# Patient Record
Sex: Female | Born: 1970 | Race: White | Hispanic: No | Marital: Married | Smoking: Former smoker
Health system: Southern US, Community
[De-identification: ages and names within clinical notes are randomized; demographics above are authoritative.]

## PROBLEM LIST (undated history)

## (undated) DIAGNOSIS — M419 Scoliosis, unspecified: Secondary | ICD-10-CM

## (undated) DIAGNOSIS — D649 Anemia, unspecified: Secondary | ICD-10-CM

## (undated) DIAGNOSIS — M1990 Unspecified osteoarthritis, unspecified site: Secondary | ICD-10-CM

## (undated) DIAGNOSIS — M5136 Other intervertebral disc degeneration, lumbar region: Secondary | ICD-10-CM

## (undated) DIAGNOSIS — F32A Depression, unspecified: Secondary | ICD-10-CM

## (undated) DIAGNOSIS — K047 Periapical abscess without sinus: Secondary | ICD-10-CM

## (undated) DIAGNOSIS — G43909 Migraine, unspecified, not intractable, without status migrainosus: Secondary | ICD-10-CM

## (undated) DIAGNOSIS — G5640 Causalgia of unspecified upper limb: Secondary | ICD-10-CM

## (undated) DIAGNOSIS — F419 Anxiety disorder, unspecified: Secondary | ICD-10-CM

## (undated) DIAGNOSIS — F444 Conversion disorder with motor symptom or deficit: Secondary | ICD-10-CM

## (undated) DIAGNOSIS — T783XXA Angioneurotic edema, initial encounter: Secondary | ICD-10-CM

## (undated) DIAGNOSIS — L509 Urticaria, unspecified: Secondary | ICD-10-CM

## (undated) DIAGNOSIS — M5126 Other intervertebral disc displacement, lumbar region: Secondary | ICD-10-CM

## (undated) DIAGNOSIS — R569 Unspecified convulsions: Secondary | ICD-10-CM

## (undated) DIAGNOSIS — M51369 Other intervertebral disc degeneration, lumbar region without mention of lumbar back pain or lower extremity pain: Secondary | ICD-10-CM

## (undated) DIAGNOSIS — G9050 Complex regional pain syndrome I, unspecified: Secondary | ICD-10-CM

## (undated) HISTORY — DX: Scoliosis, unspecified: M41.9

## (undated) HISTORY — DX: Conversion disorder with motor symptom or deficit: F44.4

## (undated) HISTORY — DX: Unspecified convulsions: R56.9

## (undated) HISTORY — PX: SINOSCOPY: SHX187

## (undated) HISTORY — PX: SHOULDER SURGERY: SHX246

## (undated) HISTORY — PX: CHOLECYSTECTOMY: SHX55

## (undated) HISTORY — PX: ABDOMINAL HYSTERECTOMY: SHX81

## (undated) HISTORY — DX: Urticaria, unspecified: L50.9

## (undated) HISTORY — DX: Causalgia of unspecified upper limb: G56.40

## (undated) HISTORY — DX: Unspecified osteoarthritis, unspecified site: M19.90

## (undated) HISTORY — PX: VAGINAL HYSTERECTOMY: SHX2639

## (undated) HISTORY — DX: Anemia, unspecified: D64.9

## (undated) HISTORY — DX: Angioneurotic edema, initial encounter: T78.3XXA

## (undated) HISTORY — DX: Periapical abscess without sinus: K04.7

## (undated) HISTORY — PX: OTHER SURGICAL HISTORY: SHX169

## (undated) HISTORY — PX: TUBAL LIGATION: SHX77

## (undated) HISTORY — DX: Depression, unspecified: F32.A

## (undated) HISTORY — PX: WRIST SURGERY: SHX841

## (undated) HISTORY — PX: COSMETIC SURGERY: SHX468

---

## 1998-01-08 ENCOUNTER — Other Ambulatory Visit: Admission: RE | Admit: 1998-01-08 | Discharge: 1998-01-08 | Payer: Self-pay | Admitting: Obstetrics and Gynecology

## 1999-01-14 ENCOUNTER — Other Ambulatory Visit: Admission: RE | Admit: 1999-01-14 | Discharge: 1999-01-14 | Payer: Self-pay | Admitting: Obstetrics and Gynecology

## 1999-05-01 ENCOUNTER — Ambulatory Visit (HOSPITAL_BASED_OUTPATIENT_CLINIC_OR_DEPARTMENT_OTHER): Admission: RE | Admit: 1999-05-01 | Discharge: 1999-05-01 | Payer: Self-pay | Admitting: Orthopedic Surgery

## 2001-06-16 ENCOUNTER — Other Ambulatory Visit: Admission: RE | Admit: 2001-06-16 | Discharge: 2001-06-16 | Payer: Self-pay | Admitting: Obstetrics and Gynecology

## 2001-12-29 ENCOUNTER — Ambulatory Visit (HOSPITAL_COMMUNITY): Admission: RE | Admit: 2001-12-29 | Discharge: 2001-12-29 | Payer: Self-pay | Admitting: Internal Medicine

## 2002-01-13 ENCOUNTER — Ambulatory Visit (HOSPITAL_COMMUNITY): Admission: RE | Admit: 2002-01-13 | Discharge: 2002-01-13 | Payer: Self-pay | Admitting: Internal Medicine

## 2002-01-23 ENCOUNTER — Ambulatory Visit (HOSPITAL_COMMUNITY): Admission: RE | Admit: 2002-01-23 | Discharge: 2002-01-23 | Payer: Self-pay | Admitting: Internal Medicine

## 2002-01-23 ENCOUNTER — Encounter: Payer: Self-pay | Admitting: Internal Medicine

## 2002-02-14 ENCOUNTER — Encounter: Payer: Self-pay | Admitting: Internal Medicine

## 2002-02-14 ENCOUNTER — Ambulatory Visit (HOSPITAL_COMMUNITY): Admission: RE | Admit: 2002-02-14 | Discharge: 2002-02-14 | Payer: Self-pay | Admitting: Internal Medicine

## 2004-03-04 ENCOUNTER — Other Ambulatory Visit: Admission: RE | Admit: 2004-03-04 | Discharge: 2004-03-04 | Payer: Self-pay | Admitting: Obstetrics and Gynecology

## 2004-04-24 ENCOUNTER — Ambulatory Visit (HOSPITAL_COMMUNITY): Admission: RE | Admit: 2004-04-24 | Discharge: 2004-04-24 | Payer: Self-pay | Admitting: Obstetrics and Gynecology

## 2004-04-24 ENCOUNTER — Encounter (INDEPENDENT_AMBULATORY_CARE_PROVIDER_SITE_OTHER): Payer: Self-pay | Admitting: *Deleted

## 2004-04-24 ENCOUNTER — Observation Stay (HOSPITAL_COMMUNITY): Admission: AD | Admit: 2004-04-24 | Discharge: 2004-04-25 | Payer: Self-pay | Admitting: Obstetrics & Gynecology

## 2005-02-18 ENCOUNTER — Other Ambulatory Visit: Admission: RE | Admit: 2005-02-18 | Discharge: 2005-02-18 | Payer: Self-pay | Admitting: Obstetrics and Gynecology

## 2007-04-04 ENCOUNTER — Encounter: Admission: RE | Admit: 2007-04-04 | Discharge: 2007-04-04 | Payer: Self-pay | Admitting: Obstetrics and Gynecology

## 2008-03-01 ENCOUNTER — Emergency Department: Payer: Self-pay | Admitting: Internal Medicine

## 2008-03-01 ENCOUNTER — Other Ambulatory Visit: Payer: Self-pay

## 2009-06-24 ENCOUNTER — Emergency Department (HOSPITAL_COMMUNITY): Admission: EM | Admit: 2009-06-24 | Discharge: 2009-06-25 | Payer: Self-pay | Admitting: Emergency Medicine

## 2011-01-23 NOTE — Op Note (Signed)
NAME:  Michele Estrada, Michele Estrada                           ACCOUNT NO.:  [id number]   MEDICAL RECORD NO.:  [id number]                   PATIENT TYPE:  OBV   LOCATION:  9307                                 FACILITY:  [facility]   PHYSICIAN:  Malva Limes, M.D.                 DATE OF BIRTH:  [birth year]   DATE OF PROCEDURE:  04/24/2004  DATE OF DISCHARGE:                                 OPERATIVE REPORT   PREOPERATIVE DIAGNOSES:  1. Menorrhagia.  2. Severe dysmenorrhea.   POSTOPERATIVE DIAGNOSES:  1. Menorrhagia.  2. Severe dysmenorrhea.   PROCEDURES:  1. Dilation and curettage.  2. Novasure endometrial ablation.   SURGEON:  Malva Limes, M.D.   ANESTHESIA:  General.   ANTIBIOTICS:  Ancef 1 g.   ESTIMATED BLOOD LOSS:  Minimal.   SPECIMENS:  Endometrial curettings sent to pathology.   COMPLICATIONS:  None.   DRAINS:  Red rubber catheter to the bladder.   PROCEDURE:  The patient was taken to the operating room, where she was  placed in the dorsal supine position.  A general anesthetic was administered  without complications.  She was then placed in the dorsal lithotomy  position.  She was prepped with Hibiclens and her bladder was drained with a  red rubber catheter.  The patient was prepped in the usual fashion for this  procedure.  A sterile speculum was placed in the vagina, a single-tooth  tenaculum was applied to the anterior cervical lip.  The cervix was dilated  to a 2 [place].  The uterine sound measured the endometrial cavity at 8 cm.  Following this the cervical length was measured and found to be 3 cm.  At  this point sharp curettage was performed with tissue being obtained and sent  to pathology.  The Novasure device was then placed into the uterine cavity  into the fundus.  The device was then seated and the endometrial width found  to be 3.8 cm.  Following this the device was turned on for a total of 59  seconds at 105 watts.  The patient tolerated the  procedure well, and  she was taken to the recovery room in stable condition.  Instrument and lap counts were correct x1.  The patient will be discharged  to home.  She will be sent home with Aleve to take p.r.n.  She will be  instructed to follow up in the office in four weeks.                                               Malva Limes, M.D.    MA/MEDQ  D:  04/24/2004  T:  04/25/2004  Job:  [id number]

## 2012-05-13 ENCOUNTER — Encounter: Payer: Self-pay | Admitting: Family Medicine

## 2012-05-13 ENCOUNTER — Ambulatory Visit (INDEPENDENT_AMBULATORY_CARE_PROVIDER_SITE_OTHER): Payer: Self-pay | Admitting: Family Medicine

## 2012-05-13 VITALS — BP 118/78 | HR 86 | Temp 98.3°F | Ht 61.0 in | Wt 147.0 lb

## 2012-05-13 DIAGNOSIS — Z86718 Personal history of other venous thrombosis and embolism: Secondary | ICD-10-CM | POA: Insufficient documentation

## 2012-05-13 DIAGNOSIS — Z862 Personal history of diseases of the blood and blood-forming organs and certain disorders involving the immune mechanism: Secondary | ICD-10-CM

## 2012-05-13 DIAGNOSIS — IMO0002 Reserved for concepts with insufficient information to code with codable children: Secondary | ICD-10-CM | POA: Insufficient documentation

## 2012-05-13 DIAGNOSIS — Z9149 Other personal history of psychological trauma, not elsewhere classified: Secondary | ICD-10-CM

## 2012-05-13 DIAGNOSIS — G43109 Migraine with aura, not intractable, without status migrainosus: Secondary | ICD-10-CM | POA: Insufficient documentation

## 2012-05-13 NOTE — Progress Notes (Signed)
  Subjective:    Patient ID: Michele Estrada, female    DOB: Jun 26, 1971, 41 y.o.   MRN: [id number]  HPI Here to establish care.  Sees neuro (Love) for migraine headaches.  Applying for [facility] card. Behind on health maint.  No general doc visit x 3 years. Feels well, no complaints.    Review of Systems     Objective:   Physical Exam Lungs clear Cardiac RRR without m or g       Assessment & Plan:

## 2012-05-13 NOTE — Assessment & Plan Note (Signed)
Needs CBC next visit plus lots of preventive work.

## 2012-05-13 NOTE — Assessment & Plan Note (Signed)
Stable, Dr. Sandria Manly will manage moving forward

## 2012-05-13 NOTE — Patient Instructions (Addendum)
[employer] foods that are high in iron.  Dark green leafy vegetables. When you come back,  You will get a Pap smear, lots of blood work, a booster tetanus shot,  And a flu shot. Best to come back after [employer]

## 2012-06-03 ENCOUNTER — Encounter: Payer: Self-pay | Admitting: Family Medicine

## 2012-06-03 ENCOUNTER — Other Ambulatory Visit (HOSPITAL_COMMUNITY)
Admission: RE | Admit: 2012-06-03 | Discharge: 2012-06-03 | Disposition: A | Discharge status: Home / Self Care | Payer: Self-pay | Source: Ambulatory Visit | Attending: Family Medicine | Admitting: Family Medicine

## 2012-06-03 ENCOUNTER — Ambulatory Visit (INDEPENDENT_AMBULATORY_CARE_PROVIDER_SITE_OTHER): Payer: Self-pay | Admitting: Family Medicine

## 2012-06-03 VITALS — BP 118/78 | HR 80 | Ht 61.0 in | Wt 146.4 lb

## 2012-06-03 DIAGNOSIS — Z13220 Encounter for screening for lipoid disorders: Secondary | ICD-10-CM | POA: Insufficient documentation

## 2012-06-03 DIAGNOSIS — Z01419 Encounter for gynecological examination (general) (routine) without abnormal findings: Secondary | ICD-10-CM | POA: Insufficient documentation

## 2012-06-03 DIAGNOSIS — Z0000 Encounter for general adult medical examination without abnormal findings: Secondary | ICD-10-CM | POA: Insufficient documentation

## 2012-06-03 DIAGNOSIS — Z124 Encounter for screening for malignant neoplasm of cervix: Secondary | ICD-10-CM

## 2012-06-03 DIAGNOSIS — N6310 Unspecified lump in the right breast, unspecified quadrant: Secondary | ICD-10-CM | POA: Insufficient documentation

## 2012-06-03 DIAGNOSIS — Z862 Personal history of diseases of the blood and blood-forming organs and certain disorders involving the immune mechanism: Secondary | ICD-10-CM

## 2012-06-03 DIAGNOSIS — Z23 Encounter for immunization: Secondary | ICD-10-CM

## 2012-06-03 DIAGNOSIS — N63 Unspecified lump in unspecified breast: Secondary | ICD-10-CM

## 2012-06-03 DIAGNOSIS — Z833 Family history of diabetes mellitus: Secondary | ICD-10-CM | POA: Insufficient documentation

## 2012-06-03 LAB — CBC
HCT: 39.6 % (ref 36.0–46.0)
Hemoglobin: 13.5 g/dL (ref 12.0–15.0)
MCV: 89.6 fL (ref 78.0–100.0)
RDW: 12.2 % (ref 11.5–15.5)
WBC: 6.2 10*3/uL (ref 4.0–10.5)

## 2012-06-03 LAB — BASIC METABOLIC PANEL
Calcium: 9.4 mg/dL (ref 8.4–10.5)
Creat: 0.96 mg/dL (ref 0.50–1.10)
Sodium: 141 mEq/L (ref 135–145)

## 2012-06-03 LAB — LIPID PANEL
HDL: 40 mg/dL (ref >39)
LDL Cholesterol: 83 mg/dL (ref 0–99)
Total CHOL/HDL Ratio: 3.4 Ratio
Triglycerides: 67 mg/dL (ref <150)
VLDL: 13 mg/dL (ref 0–40)

## 2012-06-03 MED ORDER — KETOPROFEN 50 MG PO CAPS: 50.0000 mg | ORAL_CAPSULE | Freq: Three times a day (TID) | ORAL | Status: DC | PRN

## 2012-06-03 NOTE — Progress Notes (Signed)
  Subjective:    Patient ID: Michele Estrada, female    DOB: 04-Oct-1970, 41 y.o.   MRN: [id number]  HPI In for physical.  Also complains of Rt breast mass x 4 weeks.  Did have trauma to the area but feels that had breast mass before trauma.  Behind on health maint.  Otherwise feels fine.   + FHx for hypertension and diabetes.       Review of Systems     Objective:   Physical Exam HEENT normal Neck supple  Lungs clear.   Cardiac RRR without m or G Rt breast small mass and marked tenderness in lateral aspect at about 10 o,cloock Abd benign Pelvic WNL Pap done.       Assessment & Plan:

## 2012-06-03 NOTE — Assessment & Plan Note (Signed)
Diagnostic mammo 

## 2012-06-03 NOTE — Assessment & Plan Note (Signed)
Check cbc 

## 2012-06-03 NOTE — Patient Instructions (Addendum)
Flu shot today - every year Tdap = tetanus and pertussis today: once every 10 years. I will call with labs and pap Make sure you get your mammogram

## 2012-06-03 NOTE — Assessment & Plan Note (Signed)
Check chol

## 2012-06-06 ENCOUNTER — Ambulatory Visit
Admission: RE | Admit: 2012-06-06 | Discharge: 2012-06-06 | Disposition: A | Discharge status: Home / Self Care | Payer: Self-pay | Source: Ambulatory Visit | Attending: Family Medicine | Admitting: Family Medicine

## 2012-06-06 ENCOUNTER — Encounter: Payer: Self-pay | Admitting: Family Medicine

## 2012-06-06 ENCOUNTER — Other Ambulatory Visit: Payer: Self-pay | Admitting: Family Medicine

## 2012-06-06 DIAGNOSIS — N6310 Unspecified lump in the right breast, unspecified quadrant: Secondary | ICD-10-CM

## 2012-06-15 ENCOUNTER — Encounter: Payer: Self-pay | Admitting: Family Medicine

## 2012-06-15 ENCOUNTER — Ambulatory Visit (INDEPENDENT_AMBULATORY_CARE_PROVIDER_SITE_OTHER): Payer: Self-pay | Admitting: Family Medicine

## 2012-06-15 VITALS — BP 117/96 | HR 100 | Temp 98.8°F | Wt 142.0 lb

## 2012-06-15 DIAGNOSIS — L259 Unspecified contact dermatitis, unspecified cause: Secondary | ICD-10-CM | POA: Insufficient documentation

## 2012-06-15 MED ORDER — PREDNISONE 20 MG PO TABS: ORAL_TABLET | ORAL | Status: DC

## 2012-06-15 NOTE — Patient Instructions (Addendum)
You have poisen ivy.  Sign up for My Chart.

## 2012-06-15 NOTE — Assessment & Plan Note (Addendum)
Poison ivy of leg.  Will Rx with prednisone not due to severity of rash but due to the severity of her previous reactions.

## 2012-06-16 NOTE — Progress Notes (Signed)
  Subjective:    Patient ID: Michele Estrada, female    DOB: 08/17/1971, 41 y.o.   MRN: [id number]  HPI Rash on legs, worked in yard two days ago, concerned about poison ivy.  Has had severe reactions in the past.    Review of Systems     Objective:   Physical Exam Mild rash on right leg.  Linear consistent with early poison ivy       Assessment & Plan:

## 2012-06-20 ENCOUNTER — Telehealth: Payer: Self-pay | Admitting: Family Medicine

## 2012-06-20 DIAGNOSIS — K0381 Cracked tooth: Secondary | ICD-10-CM

## 2012-06-20 NOTE — Telephone Encounter (Signed)
Patient is calling for a referral because she broke a tooth completely in half and needs to see a dentist.

## 2012-06-20 NOTE — Telephone Encounter (Signed)
Called and LMOVM informing pt that she will have to find a dentist on her own due to they only take 10 referrals per month and those slots have been filled, or if it is possible we can try to get her on the referral list for November this is not a guarantee that she will be seen due to limited spaces.  She can call around to find a dentist however, she will have to pay out of pocket. Laureen Ochs, Viann Shove

## 2012-06-20 NOTE — Addendum Note (Signed)
Addended by: Deno Etienne on: 06/20/2012 03:20 PM   Modules accepted: Orders

## 2012-07-04 ENCOUNTER — Ambulatory Visit (INDEPENDENT_AMBULATORY_CARE_PROVIDER_SITE_OTHER): Payer: Self-pay | Admitting: Family Medicine

## 2012-07-04 ENCOUNTER — Encounter: Payer: Self-pay | Admitting: Family Medicine

## 2012-07-04 VITALS — BP 152/132 | HR 73 | Temp 98.5°F | Ht 61.0 in | Wt 145.0 lb

## 2012-07-04 DIAGNOSIS — K047 Periapical abscess without sinus: Secondary | ICD-10-CM

## 2012-07-04 HISTORY — DX: Periapical abscess without sinus: K04.7

## 2012-07-04 MED ORDER — CLINDAMYCIN HCL 300 MG PO CAPS: 300.0000 mg | ORAL_CAPSULE | Freq: Three times a day (TID) | ORAL | Status: DC

## 2012-07-04 MED ORDER — OXYCODONE-ACETAMINOPHEN 5-325 MG PO TABS: 1.0000 | ORAL_TABLET | Freq: Three times a day (TID) | ORAL | Status: DC | PRN

## 2012-07-04 MED ORDER — AMOXICILLIN 500 MG PO CAPS: 500.0000 mg | ORAL_CAPSULE | Freq: Three times a day (TID) | ORAL | Status: DC

## 2012-07-04 MED ORDER — AMOXICILLIN-POT CLAVULANATE 875-125 MG PO TABS: 1.0000 | ORAL_TABLET | Freq: Two times a day (BID) | ORAL | Status: DC

## 2012-07-04 NOTE — Assessment & Plan Note (Signed)
Dental abscess likely.  Pt in exquisite pain but with no vital sign instability or fever. - Amoxicillin 500 mg TID x 10 days - Percocet 5-325 10 tabs - Ambulatory referral to dentistry as pt needs dental evaluation and treatment; hoping for appointment in 1-2 weeks

## 2012-07-04 NOTE — Patient Instructions (Signed)
It was good to see you today.  Sorry you aren't feeling well. - You will need a dental appointment.  - I have prescribed antibiotics to take twice daily for 10 days. - I have also prescribed pain medication.  Only take this if you absolutely need it and avoid taking much more tylenol, as this medication has tylenol in it too. - I have made the referral to dental care.  You should get a call about this in the next few days.  Take care, and you can return if you do not have any resolution of symptoms or any worsening symptoms.  Otherwise, return as scheduled to see Dr. Leveda Anna.

## 2012-07-04 NOTE — Progress Notes (Signed)
Subjective:     Patient ID: Michele Estrada, female   DOB: [birth year], 41 y.o.   MRN: [id number]  HPI This is a 41 y.o. female here for right sided jaw and tooth pain.  Pt broke tooth last week when she was chewing gum.  Reports h/o very soft teeth that she states are rotting from the roots up.  Then 3 days ago, jaw started hurting with 10/10 pain that makes pt cry when she swishes or tries to brush or eat.  Pain is throbbing, sharp, and burning and prevents pt from sleeping.  Denies fevers or chills, bleeding, pus.  Reports some jaw swelling, pain with any movement, no relief with ibuprofen or oragel.  Pain extends to entire right side of face and this weekend has triggered migraines all weekend.  Pain has been worsening since onset.  Review of Systems Per HPI but otherwise negative.    PMH, SH, and FH reviewed and noncontributory other than h/o teeth rotting in pt and family members.    Objective:   Physical Exam BP 152/132  Pulse 73  Temp 98.5 F (36.9 C) (Oral)  Ht 5\' 1"  (1.549 m)  Wt 145 lb (65.772 kg)  BMI 27.40 kg/m2 BP on repeat was 142/90 GEN: NAD, though holding right side of jaw with hand HEENT: right lower 28th tooth broken with black rotted area at crack, exquisitely tender but with no erythema or exudate at gums, foul odor; right submandibular fullness and tenderness; right jaw fullness and tenderness; no tenderness to palpation of frontal or maxillary sinuses; no anterior cervical or submandibular LAD; PERRL, EOMI    Assessment:     41 yo female with h/o poor dentition here with possible dental abscess     Plan:

## 2012-07-08 ENCOUNTER — Encounter: Payer: Self-pay | Admitting: Family Medicine

## 2012-07-08 ENCOUNTER — Encounter: Payer: Self-pay | Admitting: *Deleted

## 2012-07-12 ENCOUNTER — Telehealth: Payer: Self-pay | Admitting: Family Medicine

## 2012-07-12 NOTE — Telephone Encounter (Signed)
Pain in right armpit where breast pain has been increasing and very aggravating to the patient. Patient states that it hurts when she rides motorcycle due to size of her breasts. When she puts on bras it is very painful and she has tried to use support bras and they do not help at all. She would like to know what she should do next or what the next step will be. She had the mammogram and the results had show what she said was "fatty areas" explained to her by you.

## 2012-07-12 NOTE — Telephone Encounter (Signed)
Pt is still hurting in her breast and under her arm pits and wants to know what else she needs to do - wants to talk to Dr Leveda Anna  After 2:15 she will be on her cell phone

## 2012-07-12 NOTE — Telephone Encounter (Signed)
Called and discussed.  Sounds like it may be axillary or musculoskeletal.  She will come in and get seen.

## 2012-07-13 ENCOUNTER — Encounter: Payer: Self-pay | Admitting: Family Medicine

## 2012-07-13 ENCOUNTER — Ambulatory Visit (INDEPENDENT_AMBULATORY_CARE_PROVIDER_SITE_OTHER): Payer: Self-pay | Admitting: Family Medicine

## 2012-07-13 VITALS — BP 108/72 | HR 81 | Temp 98.9°F | Ht 61.0 in | Wt 145.8 lb

## 2012-07-13 DIAGNOSIS — N644 Mastodynia: Secondary | ICD-10-CM

## 2012-07-13 MED ORDER — GABAPENTIN 300 MG PO CAPS: 300.0000 mg | ORAL_CAPSULE | Freq: Three times a day (TID) | ORAL | Status: DC

## 2012-07-13 NOTE — Patient Instructions (Addendum)
Look up fibrocystic disease of breast. Try completely eliminating caffeine from your diet for one month to see if that makes any difference.   Keep a calendar with you breast pain and see if there is any monthly cycle variation.   Try gabapentin as a nerve pain medication.  Start with one pill at night and increase to one pill three times per day.

## 2012-07-18 NOTE — Progress Notes (Signed)
  Subjective:    Patient ID: Michele Estrada, female    DOB: 03-Feb-1971, 41 y.o.   MRN: [id number]  HPI Complains of Right breast and axillary pain.  Began as trauma (an elbow) to her right breast one month ago.  Had normal diagnostic mammogram  Now pain seems to be more in the chest wall/axilla.  Some hypersensitivity of skin.  "It hurts when I put on deodrant."   No pain on motion of the shoulder.       Review of Systems     Objective:   Physical Exam Pain deep in the breast with no palpable abnormality.  My exam was extremely uncomfortable for her.  Some hypersensitivity of the skin of axilla.  No axillary adenopathy or skin changes.        Assessment & Plan:

## 2012-07-18 NOTE — Assessment & Plan Note (Signed)
Given normal mammogram and exam, I am conceptualizing this pain as akin to complex regional pain syndrome.  Per her description of the pain, it seems to have a clear neuropathic component.  For now, I will treat with gabapentin.  If worsens, consider revisiting the differential diagnosis.

## 2012-07-25 ENCOUNTER — Telehealth: Payer: Self-pay | Admitting: Family Medicine

## 2012-07-25 DIAGNOSIS — N644 Mastodynia: Secondary | ICD-10-CM

## 2012-07-25 NOTE — Telephone Encounter (Signed)
Pt calling because the gabapentin is not working well for her. She believe that it is counteracting with her Topamax. She stated that its making her moody, and doesn't seem to help with the breast pain. She is asking if there is something else that can be called into the pharmacy for her. [facility] [address] in [place].Michele Estrada

## 2012-07-25 NOTE — Assessment & Plan Note (Signed)
Discussed.  Does not want to continue gabapentin.  Lidoderm patch too expensive.  Could try nortriptyline: for now, despite continued breast pain will try being off all meds.  She will call if changes her mind and I will likely try nortriptyline next.

## 2013-03-21 ENCOUNTER — Telehealth: Payer: Self-pay | Admitting: Neurology

## 2013-03-22 ENCOUNTER — Encounter: Payer: Self-pay | Admitting: Neurology

## 2013-03-22 ENCOUNTER — Other Ambulatory Visit: Payer: Self-pay

## 2013-03-22 MED ORDER — KETOPROFEN 50 MG PO CAPS: 50.0000 mg | ORAL_CAPSULE | Freq: Every day | ORAL | Status: DC | PRN

## 2013-03-22 NOTE — Telephone Encounter (Signed)
This encounter was created in error - please disregard.

## 2013-03-24 ENCOUNTER — Emergency Department (HOSPITAL_COMMUNITY)
Admission: EM | Admit: 2013-03-24 | Discharge: 2013-03-24 | Disposition: A | Discharge status: Home / Self Care | Payer: PRIVATE HEALTH INSURANCE | Attending: Emergency Medicine | Admitting: Emergency Medicine

## 2013-03-24 ENCOUNTER — Emergency Department (HOSPITAL_COMMUNITY): Payer: PRIVATE HEALTH INSURANCE

## 2013-03-24 ENCOUNTER — Telehealth: Payer: Self-pay | Admitting: Neurology

## 2013-03-24 ENCOUNTER — Encounter (HOSPITAL_COMMUNITY): Payer: Self-pay | Admitting: *Deleted

## 2013-03-24 DIAGNOSIS — R1031 Right lower quadrant pain: Secondary | ICD-10-CM | POA: Insufficient documentation

## 2013-03-24 DIAGNOSIS — F17200 Nicotine dependence, unspecified, uncomplicated: Secondary | ICD-10-CM | POA: Insufficient documentation

## 2013-03-24 DIAGNOSIS — Z3202 Encounter for pregnancy test, result negative: Secondary | ICD-10-CM | POA: Insufficient documentation

## 2013-03-24 DIAGNOSIS — N898 Other specified noninflammatory disorders of vagina: Secondary | ICD-10-CM | POA: Insufficient documentation

## 2013-03-24 DIAGNOSIS — R112 Nausea with vomiting, unspecified: Secondary | ICD-10-CM | POA: Insufficient documentation

## 2013-03-24 DIAGNOSIS — Z79899 Other long term (current) drug therapy: Secondary | ICD-10-CM | POA: Insufficient documentation

## 2013-03-24 DIAGNOSIS — R109 Unspecified abdominal pain: Secondary | ICD-10-CM

## 2013-03-24 DIAGNOSIS — Z8679 Personal history of other diseases of the circulatory system: Secondary | ICD-10-CM | POA: Insufficient documentation

## 2013-03-24 HISTORY — DX: Migraine, unspecified, not intractable, without status migrainosus: G43.909

## 2013-03-24 LAB — CBC WITH DIFFERENTIAL/PLATELET
Basophils Absolute: 0 10*3/uL (ref 0.0–0.1)
Basophils Relative: 0 % (ref 0–1)
Eosinophils Relative: 1 % (ref 0–5)
Lymphocytes Relative: 24 % (ref 12–46)
MCHC: 35 g/dL (ref 30.0–36.0)
MCV: 90.9 fL (ref 78.0–100.0)
Monocytes Absolute: 0.4 10*3/uL (ref 0.1–1.0)
Neutro Abs: 4.6 10*3/uL (ref 1.7–7.7)
Platelets: 187 10*3/uL (ref 150–400)
RDW: 12.3 % (ref 11.5–15.5)
WBC: 6.7 10*3/uL (ref 4.0–10.5)

## 2013-03-24 LAB — WET PREP, GENITAL
Clue Cells Wet Prep HPF POC: NONE SEEN
Trich, Wet Prep: NONE SEEN
Yeast Wet Prep HPF POC: NONE SEEN

## 2013-03-24 LAB — URINALYSIS, ROUTINE W REFLEX MICROSCOPIC
Nitrite: NEGATIVE
Protein, ur: NEGATIVE mg/dL
Specific Gravity, Urine: 1.019 (ref 1.005–1.030)
Urobilinogen, UA: 0.2 mg/dL (ref 0.0–1.0)

## 2013-03-24 LAB — COMPREHENSIVE METABOLIC PANEL
ALT: 13 U/L (ref 0–35)
AST: 16 U/L (ref 0–37)
Albumin: 3.9 g/dL (ref 3.5–5.2)
CO2: 22 mEq/L (ref 19–32)
Calcium: 9.2 mg/dL (ref 8.4–10.5)
Creatinine, Ser: 0.93 mg/dL (ref 0.50–1.10)
GFR calc non Af Amer: 76 mL/min — ABNORMAL LOW (ref >90)
Sodium: 139 mEq/L (ref 135–145)
Total Protein: 6.8 g/dL (ref 6.0–8.3)

## 2013-03-24 LAB — URINE MICROSCOPIC-ADD ON

## 2013-03-24 LAB — PREGNANCY, URINE: Preg Test, Ur: NEGATIVE

## 2013-03-24 MED ORDER — HYDROCODONE-ACETAMINOPHEN 5-325 MG PO TABS: 2.0000 | ORAL_TABLET | ORAL | Status: DC | PRN

## 2013-03-24 MED ORDER — SODIUM CHLORIDE 0.9 % IV BOLUS (SEPSIS)
1000.0000 mL | Freq: Once | INTRAVENOUS | Status: AC
  Administered 2013-03-24: 1000 mL via INTRAVENOUS

## 2013-03-24 MED ORDER — IOHEXOL 300 MG/ML  SOLN
50.0000 mL | Freq: Once | INTRAMUSCULAR | Status: AC | PRN
  Administered 2013-03-24: 50 mL via ORAL

## 2013-03-24 MED ORDER — IOHEXOL 300 MG/ML  SOLN
100.0000 mL | Freq: Once | INTRAMUSCULAR | Status: AC | PRN
  Administered 2013-03-24: 100 mL via INTRAVENOUS

## 2013-03-24 MED ORDER — MORPHINE SULFATE 4 MG/ML IJ SOLN
4.0000 mg | Freq: Once | INTRAMUSCULAR | Status: AC
  Administered 2013-03-24: 4 mg via INTRAVENOUS
  Filled 2013-03-24: qty 1

## 2013-03-24 MED ORDER — MORPHINE SULFATE 4 MG/ML IJ SOLN
4.0000 mg | Freq: Once | INTRAMUSCULAR | Status: AC
  Administered 2013-03-24: 4 mg via INTRAVENOUS

## 2013-03-24 MED ORDER — CEPHALEXIN 500 MG PO CAPS: 500.0000 mg | ORAL_CAPSULE | Freq: Four times a day (QID) | ORAL | Status: DC

## 2013-03-24 MED ORDER — ONDANSETRON HCL 4 MG PO TABS: 4.0000 mg | ORAL_TABLET | Freq: Four times a day (QID) | ORAL | Status: DC

## 2013-03-24 MED ORDER — MORPHINE SULFATE 4 MG/ML IJ SOLN
INTRAMUSCULAR | Status: AC
  Filled 2013-03-24: qty 1

## 2013-03-24 MED ORDER — ONDANSETRON HCL 4 MG/2ML IJ SOLN
4.0000 mg | Freq: Once | INTRAMUSCULAR | Status: AC
  Administered 2013-03-24: 4 mg via INTRAVENOUS
  Filled 2013-03-24: qty 2

## 2013-03-24 NOTE — ED Notes (Signed)
Pt c/o of right lower right side abdominal pain that started at 0330. Pt states she has vomited four times this morning. Pt describes the pain as stabbing. Skin warm and dry, respirations equal and unlabored, A&Ox4.

## 2013-03-24 NOTE — ED Notes (Signed)
Pt finished drinking first cup of contrast. CT made aware.

## 2013-03-24 NOTE — ED Notes (Signed)
Results of lactic acid shown to Dr. Rancour 

## 2013-03-24 NOTE — Telephone Encounter (Signed)
Dr Terrace Arabia, please advise

## 2013-03-24 NOTE — ED Provider Notes (Signed)
History    CSN: [id number] Arrival date & time 03/24/13  4098  First MD Initiated Contact with Patient 03/24/13 [phone]     Chief Complaint  Patient presents with  . Abdominal Pain   (Consider location/radiation/quality/duration/timing/severity/associated sxs/prior Treatment) HPI Comments: Is presents with acute onset of sharp stabbing right lower quadrant abdominal pain that started at 3:30 this morning. Pain is intermittent and comes in waves but lasts for hours at a time and relates for only a few minutes. Associated 4 episodes of nonbloody nonbilious emesis. Denies any diarrhea or change in bowel habits. No fever. No vaginal bleeding or discharge. No dysuria hematuria. Good by mouth intake and urine output yesterday. History of cholecystectomy and partial hysterectomy. Patient reports she still has an appendix and ovaries. Denies back pain.  The history is provided by the patient.   Past Medical History  Diagnosis Date  . Migraines    Past Surgical History  Procedure Laterality Date  . Partial hysterectomy    . Shoulder surgery    . Cholecystectomy     Family History  Problem Relation Age of Onset  . Diabetes Mother   . Hypertension Mother   . Diabetes Father   . Hypertension Father    History  Substance Use Topics  . Smoking status: Current Every Day Smoker -- 0.80 packs/day for 17 years    Types: Cigarettes  . Smokeless tobacco: Never Used  . Alcohol Use: 0.5 oz/week    1 drink(s) per week   OB History   Grav Para Term Preterm Abortions TAB SAB Ect Mult Living                 Review of Systems  Constitutional: Negative for fever, activity change and appetite change.  HENT: Negative for congestion and rhinorrhea.   Respiratory: Negative for cough, chest tightness and shortness of breath.   Cardiovascular: Negative for chest pain.  Gastrointestinal: Positive for nausea, vomiting and abdominal pain. Negative for diarrhea and constipation.  Genitourinary: Negative  for dysuria, hematuria, vaginal bleeding and vaginal discharge.  Musculoskeletal: Negative for back pain.  Neurological: Negative for dizziness, weakness and headaches.  A complete 10 system review of systems was obtained and all systems are negative except as noted in the HPI and PMH.    Allergies  Estrogens; Gabapentin; Iron; and Prozac  Home Medications   Current Outpatient Rx  Name  Route  Sig  Dispense  Refill  . ketoprofen (ORUDIS) 50 MG capsule   Oral   Take 1 capsule (50 mg total) by mouth daily as needed (headache (Take only if needed)).   30 capsule   6   . topiramate (TOPAMAX) 100 MG tablet   Oral   Take 250 mg by mouth at bedtime. Prescribed by Dr. Sandria Manly         . cephALEXin (KEFLEX) 500 MG capsule   Oral   Take 1 capsule (500 mg total) by mouth 4 (four) times daily.   40 capsule   0   . HYDROcodone-acetaminophen (NORCO/VICODIN) 5-325 MG per tablet   Oral   Take 2 tablets by mouth every 4 (four) hours as needed for pain.   10 tablet   0   . ondansetron (ZOFRAN) 4 MG tablet   Oral   Take 1 tablet (4 mg total) by mouth every 6 (six) hours.   12 tablet   0    BP 106/66  Pulse 78  Temp(Src) 98.4 F (36.9 C) (Oral)  Resp 20  SpO2 100% Physical Exam  Constitutional: She appears well-developed and well-nourished. No distress.  HENT:  Head: Normocephalic and atraumatic.  Mouth/Throat: Oropharynx is clear and moist. No oropharyngeal exudate.  Eyes: Conjunctivae and EOM are normal. Pupils are equal, round, and reactive to light.  Neck: Normal range of motion. Neck supple.  Cardiovascular: Normal rate, regular rhythm and normal heart sounds.   No murmur heard. Pulmonary/Chest: Effort normal and breath sounds normal. No respiratory distress.  Abdominal: Soft. There is tenderness. There is guarding. There is no rebound.  TTP RLQ with guarding. No rebound  Genitourinary: Vaginal discharge found.  Scant white discharge.  No CMT.  R adnexal fullness and  tenderness  Musculoskeletal: Normal range of motion. She exhibits no edema and no tenderness.  Neurological: She is alert. No cranial nerve deficit. She exhibits normal muscle tone. Coordination normal.  Skin: Skin is warm.    ED Course  Procedures (including critical care time) Labs Reviewed  WET PREP, GENITAL - Abnormal; Notable for the following:    WBC, Wet Prep HPF POC FEW (*)    All other components within normal limits  URINALYSIS, ROUTINE W REFLEX MICROSCOPIC - Abnormal; Notable for the following:    APPearance CLOUDY (*)    Hgb urine dipstick LARGE (*)    Leukocytes, UA SMALL (*)    All other components within normal limits  COMPREHENSIVE METABOLIC PANEL - Abnormal; Notable for the following:    Potassium 3.4 (*)    Glucose, Bld 100 (*)    Alkaline Phosphatase 37 (*)    GFR calc non Af Amer 76 (*)    GFR calc Af Amer 89 (*)    All other components within normal limits  URINE MICROSCOPIC-ADD ON - Abnormal; Notable for the following:    Squamous Epithelial / LPF MANY (*)    All other components within normal limits  GC/CHLAMYDIA PROBE AMP  URINE CULTURE  PREGNANCY, URINE  CBC WITH DIFFERENTIAL  LIPASE, BLOOD  CG4 I-STAT (LACTIC ACID)   US Transvaginal Non-ob  03/24/2013    *RADIOLOGY REPORT*  Clinical Data: Right lower quadrant and adnexal pain  TRANSABDOMINAL AND TRANSVAGINAL ULTRASOUND OF PELVIS  DOPPLER ULTRASOUND OF OVARIES Technique:  Both transabdominal and transvaginal ultrasound examinations of the pelvis were performed. Transabdominal technique was performed for global imaging of the pelvis including uterus, ovaries, adnexal regions, and pelvic cul-de-sac. Color and duplex Doppler ultrasound was utilized to evaluate blood flow to the ovaries.  It was necessary to proceed with endovaginal exam following the transabdominal exam to visualize the ovaries.  Comparison:  CT scan from earlier the same day.  Findings:  Uterus: 6.8 x 3.5 x 4.5 cm.  Myometrium is homogeneous.   Endometrium: 3 mm and double layer thickness.  Right ovary:  3.2 x 2.4 x 2.4 cm.  19 mm dominant simple follicle noted within the parenchyma.  Spectral Doppler evaluation confirms the presence of both arterial and venous wave forms within the parenchyma.  Left ovary: 3.4 x 1.7 x 2.6 cm.  Sonographically normal.  Doppler evaluation confirms the presence of arterial and venous flow within the parenchyma.  Other findings: No free fluid  IMPRESSION: Normal study. No evidence of pelvic mass or other significant abnormality.   Original Report Authenticated By: Kennith Center, M.D.   US Pelvis Complete  03/24/2013    *RADIOLOGY REPORT*  Clinical Data: Right lower quadrant and adnexal pain  TRANSABDOMINAL AND TRANSVAGINAL ULTRASOUND OF PELVIS  DOPPLER ULTRASOUND OF OVARIES Technique:  Both transabdominal and  transvaginal ultrasound examinations of the pelvis were performed. Transabdominal technique was performed for global imaging of the pelvis including uterus, ovaries, adnexal regions, and pelvic cul-de-sac. Color and duplex Doppler ultrasound was utilized to evaluate blood flow to the ovaries.  It was necessary to proceed with endovaginal exam following the transabdominal exam to visualize the ovaries.  Comparison:  CT scan from earlier the same day.  Findings:  Uterus: 6.8 x 3.5 x 4.5 cm.  Myometrium is homogeneous.  Endometrium: 3 mm and double layer thickness.  Right ovary:  3.2 x 2.4 x 2.4 cm.  19 mm dominant simple follicle noted within the parenchyma.  Spectral Doppler evaluation confirms the presence of both arterial and venous wave forms within the parenchyma.  Left ovary: 3.4 x 1.7 x 2.6 cm.  Sonographically normal.  Doppler evaluation confirms the presence of arterial and venous flow within the parenchyma.  Other findings: No free fluid  IMPRESSION: Normal study. No evidence of pelvic mass or other significant abnormality.   Original Report Authenticated By: Kennith Center, M.D.   Ct Abdomen Pelvis W  Contrast  03/24/2013   *RADIOLOGY REPORT*  Clinical Data: Right lower quadrant pain  CT ABDOMEN AND PELVIS WITH CONTRAST  Technique:  Multidetector CT imaging of the abdomen and pelvis was performed following the standard protocol during bolus administration of intravenous contrast.  Contrast: OMNIPAQUE IOHEXOL 300 MG/ML  SOLN  Comparison: None.  Findings: No focal abnormalities seen in the liver or spleen.  The stomach, duodenum, pancreas, and adrenal glands are unremarkable. The gallbladder is surgically absent.  Kidneys are unremarkable.  No abdominal aortic aneurysm.  No free fluid or lymphadenopathy in the abdomen.  Imaging through the pelvis shows no free intraperitoneal fluid. There is no pelvic sidewall lymphadenopathy.  Uterus is unremarkable.  Left ovary is normal.  18 mm dominant follicle noted in the right ovary.  Terminal ileum is normal.  The appendix is normal. Tiny gas locule is noted in the bladder.  Bone windows reveal no worrisome lytic or sclerotic osseous lesions.  IMPRESSION: Tiny gas locule noted in the bladder.  This may be secondary to recent instrumentation although infection could produce this appearance.  Otherwise unremarkable CT examination of the abdomen and pelvis.   Original Report Authenticated By: Kennith Center, M.D.   [place] Art/ven Flow Abd Pelv Doppler  03/24/2013    *RADIOLOGY REPORT*  Clinical Data: Right lower quadrant and adnexal pain  TRANSABDOMINAL AND TRANSVAGINAL ULTRASOUND OF PELVIS  DOPPLER ULTRASOUND OF OVARIES Technique:  Both transabdominal and transvaginal ultrasound examinations of the pelvis were performed. Transabdominal technique was performed for global imaging of the pelvis including uterus, ovaries, adnexal regions, and pelvic cul-de-sac. Color and duplex Doppler ultrasound was utilized to evaluate blood flow to the ovaries.  It was necessary to proceed with endovaginal exam following the transabdominal exam to visualize the ovaries.  Comparison:  CT  scan from earlier the same day.  Findings:  Uterus: 6.8 x 3.5 x 4.5 cm.  Myometrium is homogeneous.  Endometrium: 3 mm and double layer thickness.  Right ovary:  3.2 x 2.4 x 2.4 cm.  19 mm dominant simple follicle noted within the parenchyma.  Spectral Doppler evaluation confirms the presence of both arterial and venous wave forms within the parenchyma.  Left ovary: 3.4 x 1.7 x 2.6 cm.  Sonographically normal.  Doppler evaluation confirms the presence of arterial and venous flow within the parenchyma.  Other findings: No free fluid  IMPRESSION: Normal study. No evidence of  pelvic mass or other significant abnormality.   Original Report Authenticated By: Kennith Center, M.D.   1. Abdominal pain     MDM  RLQ pain with vomiting.  Concern for appendicitis versus ovarian cyst.   UA shows hematuria without infection. Consider recently passed stone. Pelvic exam with right adnexal tenderness. Appendix appears normal on CT.  Ultrasound negative for torsion or TOA. Pelvic exam benign.  Urinalysis shows hematuria. We'll sent culture. Question gas locule in bladder. Will send urine culture and treat for possible UTI.   Glynn Octave, MD 03/24/13 [phone]

## 2013-03-25 LAB — URINE CULTURE: Colony Count: 85000

## 2013-03-27 ENCOUNTER — Encounter: Payer: Self-pay | Admitting: Family Medicine

## 2013-03-27 ENCOUNTER — Ambulatory Visit (INDEPENDENT_AMBULATORY_CARE_PROVIDER_SITE_OTHER): Payer: PRIVATE HEALTH INSURANCE | Admitting: Family Medicine

## 2013-03-27 VITALS — BP 122/75 | HR 83 | Temp 99.5°F | Ht 61.0 in | Wt 145.0 lb

## 2013-03-27 DIAGNOSIS — R109 Unspecified abdominal pain: Secondary | ICD-10-CM

## 2013-03-27 DIAGNOSIS — R1031 Right lower quadrant pain: Secondary | ICD-10-CM | POA: Insufficient documentation

## 2013-03-27 LAB — POCT URINALYSIS DIPSTICK
Blood, UA: NEGATIVE
Ketones, UA: NEGATIVE
Protein, UA: NEGATIVE
Spec Grav, UA: 1.015
Urobilinogen, UA: 0.2

## 2013-03-27 LAB — GC/CHLAMYDIA PROBE AMP
CT Probe RNA: NEGATIVE
GC Probe RNA: NEGATIVE

## 2013-03-27 LAB — POCT UA - MICROSCOPIC ONLY

## 2013-03-27 MED ORDER — PROMETHAZINE HCL 25 MG PO TABS: 25.0000 mg | ORAL_TABLET | Freq: Three times a day (TID) | ORAL | Status: DC | PRN

## 2013-03-27 MED ORDER — KETOPROFEN 75 MG PO CAPS: 75.0000 mg | ORAL_CAPSULE | Freq: Every day | ORAL | Status: DC | PRN

## 2013-03-27 MED ORDER — HYDROCODONE-ACETAMINOPHEN 5-325 MG PO TABS: 2.0000 | ORAL_TABLET | ORAL | Status: DC | PRN

## 2013-03-27 NOTE — Progress Notes (Signed)
Subjective:     Patient ID: Michele Estrada, female   DOB: 11/01/1970, 42 y.o.   MRN: [id number]  HPI This is a 42 yo woman with previous surgical hx of cholecystectomy and chronic migraines followed by neurology presents for f/u of  recent ED visit  for sharp, "knife-like" abdominal pain on March 24, 2013 presenting with continued abdominal and flank pain that started suddenly in the morning of March 23, 2013. ED work-up revealed significant RBCs with small LE and negative nitrite on UA. Negative U preg and wet prep. Pelvic US and Abd & pelvic CT w/ contrast were unremarkable. They decided that she may have passed kidney stones and placed her on cephalexin 500 mg qid to treat for possible UTI. She is currently on Day 4 of Abx. Urine culture is negative for single predominant organism.  Since d/c from the ED the pt has experienced continued abdominal pain and flank pain which she now describes as an "aggravating, dull and constant ache, like someone is punching" her from both sides. 7/10 on pain scale. She localizes this pain to the RLQ and directly opposite end on her back. She endorses some radiation of pain to her hip when she walks. She has not tried NSAIDs or other OTCs for pain relief due to her hx of migraine headaches. She states that laying left lateral recumbent prevents further pain. The pain lasts all day and has not changed since ED d/c. She's tried to prescribe Vicodin for the pain which she states only makes her sleepy. Pt also experiences nausea since d/c. This morning she vomited 2x, 1x after having 1/2 cup of coffee and her Abx and another time after taking zofran. She reports that her appetite is decreased and the only day she has eaten well since ED d/c was on Saturday night.   She denies noticing any blood, stones, or change in the color of her urine, burning on urination, and urinary hesitancy. Endorses increased urinary freq.   Pt smokes <0.5 ppd. She denies excessive caffeine intake (0.5-1  cup coffee/day) and reports a healthy and balanced diet.    Review of Systems General: No fevers, light-headedness, dizziness. Pt endorses migraine headaches (needs refill for ketoprofen).   CV/Pulm: No chest pain. No SOB.   GI: Normal BMs. No change in color, consistency, or frequency of BMs. Reports being more gassy.   GU: Denies vaginal d/c and bleeding.   Rest of ROS as per HPI    Objective:   Physical Exam BP 122/75  Pulse 83  Temp(Src) 99.5 F (37.5 C) (Oral)  Ht 5\' 1"  (1.549 m)  Wt 145 lb (65.772 kg)  BMI 27.41 kg/m2 General appearance: alert, cooperative and mild distress. A&Ox3. Leaned over in chair to the left so as not to put pressure on her right side. Lungs: clear to auscultation bilaterally Heart: regular rate and rhythm, S1, S2 normal, no murmur, click, rub or gallop Abdomen:  +BS. Hypoactive. Pain on palpation to RLQ and pain with palpation to RUQ with inspiration. Deep palpation elicits pain or discomfort in all quadrants. No guarding. Mild pain to percussion in RLQ, however, pt does not report increased pain when deep palpation is suddenly released.  Extremities: extremities normal, atraumatic, no cyanosis or edema. RLQ pain with leg flexion that is not exacerbated with internal/external rotation of the hip. No pain with isolated external and internal rotation of the hip. Leg extension does not elicit pain.  Skin: Skin color, texture, turgor normal. No rashes or  lesions  BP 122/75  Pulse 83  Temp(Src) 99.5 F (37.5 C) (Oral)  Ht 5\' 1"  (1.549 m)  Wt 145 lb (65.772 kg)  BMI 27.41 kg/m2 General appearance: alert, cooperative, no distress and Appears uncomfortable especially when changing positions. Back: symmetric, no curvature. ROM normal. No CVA tenderness. Tender to palpation right lateral lumbar paraspinal.  Abdomen: Hypoactive BS, TTP RLQ w/o rebound or guarding. No palpable masses. Extremities: RLQ pain with leg flexion that is not exacerbated with  internal/external rotation of the hip. No pain with isolated external and internal rotation of the hip. Leg extension does not elicit pain.  Neurologic: Grossly normal   Urine dipstick shows negative for all components.  Micro exam: Occasional RBCs  Problem List: 1. Abdominal/Flank Pain 2. Kidney stones 3. N/V     Assessment:     This is a 42 yo woman with previous surgical hx of cholecystectomy and medical history chronic migraines presents to followup a recent ED admission for kidney stones with continued abdominal and flank pain suggestive of inflammation from passing kidney stones.     Plan:     1. Abdominal/Flank pain, Kidney stones - Differential for RLQ abdominal and flank pain include pyelonephritis, appendicitis, tuboovarian abscess (TOA), or tubal/ovarian torsion. Lack of fever, previous CT and pelvic US findings make appendicitis, TOA, and torsion unlikely. However, appendicitis should remain on the differential given that the pt's symptoms seem to have not improved significantly over the past few days (as would be expected if she was passed a stone). Pain and hematuria w/o indication of infxn may indicate kidney stones. Given that her UA today shows no hematuria on dipstick but occasional RBCs on microscopy, she will be continued to be treated symptomatically for passing kidney stones under close observation and f/u. Pt advised to drink plenty of water and strain urine for the next 3 days collect any stone particles. Vicodin will be continued at current dosing regimen for pain.   2. N/V - Pt will be prescribed Promethazine 25 mg PO q8hr PRN for nausea.   Pt advised to f/u on Thursday morning  with Dr. Armen Pickup, Dr. Leveda Anna, or someone on Dr. Cyndia Skeeters team, and to call immediately if she develops T>101 F or experiences persistent nausea and vomiting.    I examined the patient with Student Dr. Donnal Moat and discussed the exam findings assessment and plan with him. I have reviewed  the note, made necessary revisions in bold print and agree with above.  Dessa Phi

## 2013-03-27 NOTE — Patient Instructions (Addendum)
Michele Estrada,  Thank you for coming in today. It appears from the work up thus far that you had a kidney stone likely passed.   Please strain your urine for the next 3 days to see if you can collect any stone particles.  Continue to drink plenty of water.   For your current symptoms: Try phenergan for nausea.  Continue vicodin as needed for pain.   I recommend close f/u on Thursday morning  with me, Dr. Leveda Anna, or someone on Dr. Cyndia Skeeters team.   Call and come in sooner if you develop fever, T > 101, persistent nausea and vomiting.   Dr. Armen Pickup

## 2013-03-28 NOTE — Assessment & Plan Note (Addendum)
A: Patient with 5 days with severe right lower quadrant pain. She had a significant workup in the ED that was negative for an acute intra-abdominal abdominal or pelvic process. Hematuria on urinalysis is highly suggestive of nephrolithiasis. Suspect that she s she is also suffering for abdominal migraine due to not having access to ketoprofen. The patient's renal function was normal on recent BE met obtained in ED.  P: Continue Vicodin as needed for pain. Refill ketoprofen. Patient advised to complete course of Keflex although her urine culture was negative. Provided patient with a strainer to strain urine for stones for the next 3 days. Patient will follow closely with me in the office in 3 days. Provide patient with a note to excuse her absence from work. To consider starting patient on Flomax but there is no evidence of urinary tract obstruction. I reviewed the signs and symptoms that should prompt the patient return here sooner than her scheduled appointment which include but are not limited to fever, persistent nausea vomiting, acute worsening of her right lower quadrant pain.

## 2013-03-29 ENCOUNTER — Telehealth: Payer: Self-pay | Admitting: Family Medicine

## 2013-03-29 MED ORDER — TRAMADOL HCL 50 MG PO TABS: 50.0000 mg | ORAL_TABLET | Freq: Four times a day (QID) | ORAL | Status: DC | PRN

## 2013-03-29 MED ORDER — TAMSULOSIN HCL 0.4 MG PO CAPS: 0.4000 mg | ORAL_CAPSULE | Freq: Every day | ORAL | Status: DC

## 2013-03-29 NOTE — Telephone Encounter (Signed)
Pt is requesting pain medication for her kidney stone. She has an appointment 7/24. Shw would like it sent to her pharmacy on file. JW

## 2013-03-29 NOTE — Telephone Encounter (Signed)
Fax received requesting refill for hydrocodone r/t kidney stone. Wyatt Haste, RN-BSN

## 2013-03-29 NOTE — Telephone Encounter (Signed)
Called patient back upon seeing that she called in requesting a refill.  Patient reports persistent and severe RLQ pain. Nausea. No vomiting. Temp 100.2 this AM. Saw blood in urine this AM. Straining urine. Has not collected stones. Out of vicodin.   Plan: Tramadol prn Start flomax Keep f/u appt in office with me.   Patient agrees with plan and voices understanding.   Called patient's pharmacy. Called in tramadol. They are expecting patient.

## 2013-03-30 ENCOUNTER — Encounter: Payer: Self-pay | Admitting: Family Medicine

## 2013-03-30 ENCOUNTER — Telehealth: Payer: Self-pay | Admitting: Family Medicine

## 2013-03-30 ENCOUNTER — Ambulatory Visit (INDEPENDENT_AMBULATORY_CARE_PROVIDER_SITE_OTHER): Payer: PRIVATE HEALTH INSURANCE | Admitting: Family Medicine

## 2013-03-30 VITALS — BP 102/70 | HR 74 | Temp 97.8°F | Wt 145.0 lb

## 2013-03-30 DIAGNOSIS — N760 Acute vaginitis: Secondary | ICD-10-CM

## 2013-03-30 DIAGNOSIS — R1031 Right lower quadrant pain: Secondary | ICD-10-CM

## 2013-03-30 LAB — POCT WET PREP (WET MOUNT)

## 2013-03-30 LAB — POCT URINALYSIS DIPSTICK
Bilirubin, UA: NEGATIVE
Blood, UA: NEGATIVE
Glucose, UA: NEGATIVE
Ketones, UA: NEGATIVE
Spec Grav, UA: 1.025
pH, UA: 7

## 2013-03-30 LAB — POCT UA - MICROSCOPIC ONLY

## 2013-03-30 NOTE — Assessment & Plan Note (Signed)
A:  persistent right lower quadrant or right low back pain. I reviewed all the labs and studies obtained regarding this problem and at the top of the differential remains right kidney stone. He is concerned the patient has persistent pain without improvement of symptoms. History sure that she remains afebrile has had no vomiting and is symptomatically stable. I attribute the decrease in her blood pressure to her recent start of Flomax. P: Continue tramadol for pain control. Continue by mouth fluids.  Continue to strain urine. I discussed with patient and see if she had a large obstructing renal stone will most likely have been seen on contrast CT. However in the minimal to ordering a repeat CT without contrast he is has been improvement or becomes symptomatically worse over the next day.

## 2013-03-30 NOTE — Telephone Encounter (Signed)
Noted and she is being seen by Dr. Armen Pickup this morning to address these concerns.

## 2013-03-30 NOTE — Telephone Encounter (Signed)
I called the patient to check on how she was doing. She reports that she still hurting in the same location. She is currently resting. She has been able to sleep. She does not have vomiting. She still passing urine has not noticed significant hematuria. She's the one you void was normal and one void was hesitant and small volume.  The plan is for me to call the patient in the morning. If she does not have improvement in her level of pain by then I will order repeat CT without contrast to evaluate for obstructing kidney stone  patient is agreeable to this plan and voiced understanding.

## 2013-03-30 NOTE — Progress Notes (Signed)
Subjective:     Patient ID: Michele Estrada, female   DOB: 12-06-1970, 42 y.o.   MRN: [id number]  HPI 42 year female history of migraine headaches presents for followup of right lower quadrant pain. Patient was seen by me earlier this week (please see the office note for details). Today she reports persistent right upper quadrant and right low back pain. She describes the pain as 10 out of 10 in severity. The pain is like the punching sensation the pain is chronic. She's currently taking tramadol which only mildly reduces the pain. In addition she started Flomax last night. The pain is interfering with ability to sleep. She slept for 2 hours last night. This morning she reports feeling dizzy admits to urinary hesitancy, dysuria, hematuria and nausea. She denies fever, vomiting, diarrhea, constipation.    Review of Systems as per history of present illness     Objective:   Physical Exam BP 102/70  Pulse 74  Temp(Src) 97.8 F (36.6 C) (Oral)  Wt 145 lb (65.772 kg)  BMI 27.41 kg/m2 General appearance: alert, cooperative and no distress Lungs: clear to auscultation bilaterally Heart: regular rate and rhythm, S1, S2 normal, no murmur, click, rub or gallop Back: Normal skin without rash.  symmetric, no curvature. ROM normal. No CVA tenderness. Tender to palpation right lateral lumbar paraspinal.  Abdomen: Normal skin without rash. Hypoactive BS, TTP RLQ w/o rebound or guarding. No palpable masses.  Pelvic: Normal external genitalia. There is no perirectal lesions or bleeding noted. Vagina is normal there is homogenous white vaginal discharge. Cervix is normal. There is no cervical motion tenderness. There is no uterine or left adnexal tenderness. Difficult to discern right adnexal tenderness given this is the location of her current pain. Neurologic: Grossly normal  Repeat UA: Small leukocytes. Negative red blood cells.  Repeat U micro: Loaded with epithelial cells. One to 3 white blood cells. 2+  cocci. 5-10 red blood cells.  Wet prep: Positive for moderate clue cells and moderate yeast. Assesment and Plan:

## 2013-03-30 NOTE — Patient Instructions (Addendum)
Michele Estrada,  I am so sorry that you are still feeling so poorly.  I have reviewed all of your labs and studies again and there is nothing jumping out at me.   For now, please finish keflex.  Your dizziness may be due to flomax, but I still want you to take it. To off set this please drink Gatorade.   For pain control: Continue tramadol I would like to take tylenol as well 600 mg every 6 hrs.  Ibuprofen 600 mg every 6 hrs.   Dr. Armen Pickup

## 2013-03-31 ENCOUNTER — Telehealth: Payer: Self-pay | Admitting: Family Medicine

## 2013-03-31 ENCOUNTER — Ambulatory Visit (HOSPITAL_COMMUNITY)
Admission: RE | Admit: 2013-03-31 | Discharge: 2013-03-31 | Disposition: A | Discharge status: Home / Self Care | Payer: PRIVATE HEALTH INSURANCE | Source: Ambulatory Visit | Attending: Family Medicine | Admitting: Family Medicine

## 2013-03-31 ENCOUNTER — Telehealth: Payer: Self-pay | Admitting: *Deleted

## 2013-03-31 DIAGNOSIS — R109 Unspecified abdominal pain: Secondary | ICD-10-CM | POA: Insufficient documentation

## 2013-03-31 DIAGNOSIS — Z9089 Acquired absence of other organs: Secondary | ICD-10-CM | POA: Insufficient documentation

## 2013-03-31 DIAGNOSIS — R319 Hematuria, unspecified: Secondary | ICD-10-CM | POA: Insufficient documentation

## 2013-03-31 DIAGNOSIS — R1031 Right lower quadrant pain: Secondary | ICD-10-CM

## 2013-03-31 NOTE — Telephone Encounter (Signed)
Called patient. She is still making and passing urine well. Still having pain with urination in the same location, RLQ and RL back.   Plan: Repeat CT w/o contrast.   Hopefully today this afternoon. If not today, Monday (patient prefers Monday AM or (Monday after 3 PM).

## 2013-03-31 NOTE — Telephone Encounter (Signed)
Error

## 2013-03-31 NOTE — Telephone Encounter (Signed)
Patient is calling to let Dr. Armen Pickup know that she is having sharp pains and nausea and has not passed the stone yet.  She saw quite a bit of blood yesterday but has not seen any today.

## 2013-03-31 NOTE — Telephone Encounter (Signed)
Ct scheduled at [facility] for today at 2pm.  Pt instructed no food or drink from now until after appt.  Pt verbalized understanding.

## 2013-03-31 NOTE — Telephone Encounter (Signed)
Message copied by Osborne Oman on Fri Mar 31, 2013  4:26 PM ------      Message from: Dessa Phi      Created: Fri Mar 31, 2013  3:46 PM       Please call patient.            Plan is to continue current care.       Keep f/u.      Work as tolerated.       Moderate stool burden on CT. If she is has not moved bowels well try OTC stool softener like colace. ------

## 2013-03-31 NOTE — Telephone Encounter (Signed)
Pt informed. Michele Estrada  

## 2013-03-31 NOTE — Telephone Encounter (Signed)
LMOVM for pt to return call .Fleeger, Jessica Dawn  

## 2013-03-31 NOTE — Telephone Encounter (Signed)
Wonderful! I will look out for the report.

## 2013-04-03 ENCOUNTER — Other Ambulatory Visit (HOSPITAL_COMMUNITY): Payer: PRIVATE HEALTH INSURANCE

## 2013-04-04 ENCOUNTER — Ambulatory Visit (INDEPENDENT_AMBULATORY_CARE_PROVIDER_SITE_OTHER): Payer: PRIVATE HEALTH INSURANCE | Admitting: Family Medicine

## 2013-04-04 ENCOUNTER — Encounter: Payer: Self-pay | Admitting: Family Medicine

## 2013-04-04 VITALS — BP 107/73 | HR 87 | Temp 98.0°F | Wt 146.0 lb

## 2013-04-04 DIAGNOSIS — R1031 Right lower quadrant pain: Secondary | ICD-10-CM

## 2013-04-04 LAB — POCT UA - MICROSCOPIC ONLY

## 2013-04-04 LAB — CBC
Platelets: 233 10*3/uL (ref 150–400)
RDW: 12.4 % (ref 11.5–15.5)
WBC: 9.3 10*3/uL (ref 4.0–10.5)

## 2013-04-04 LAB — POCT URINALYSIS DIPSTICK
Bilirubin, UA: NEGATIVE
Nitrite, UA: NEGATIVE
pH, UA: 6

## 2013-04-04 LAB — BASIC METABOLIC PANEL
Chloride: 110 mEq/L (ref 96–112)
Potassium: 4.4 mEq/L (ref 3.5–5.3)

## 2013-04-04 MED ORDER — TRAMADOL HCL 50 MG PO TABS: 50.0000 mg | ORAL_TABLET | Freq: Four times a day (QID) | ORAL | Status: DC | PRN

## 2013-04-04 NOTE — Patient Instructions (Addendum)
Ms. Norton,  Thank you for coming back. You still have trace blood in your urine. You also have increased white bloods cells in your urine. I have sent your urine for culture.   I have refilled tramadol.  Please f/u with Dr. Leveda Anna.   Dr. Armen Pickup

## 2013-04-04 NOTE — Progress Notes (Signed)
Subjective:     Patient ID: Michele Estrada, female   DOB: February 05, 1971, 42 y.o.   MRN: [id number]  HPI 42 year female history of migraine headaches presents for followup of right lower quadrant pain. Since her last office visit patient has come for repeat CT scan without contrast which was negative for nephrolithiasis. Today she reports persistent right upper quadrant and right low back pain. She describes the pain as 10 out of 10 in severity. The pain is sharp and non radiating.pain is exacerbated by urination. She wasy taking tramadol which only mildly reduces the pain, she's been out of tramadol.. In addition he still taking Flomax nightly. She admits to nausea with one episode of nonbloody and nonbilious emesis this morning. She denies fever, diarrhea, constipation, and recurrent hematuria.   Review of Systems As per HPI     Objective:   Physical Exam BP 107/73  Pulse 87  Temp(Src) 98 F (36.7 C) (Oral)  Wt 146 lb (66.225 kg)  BMI 27.6 kg/m2 General appearance: alert, cooperative and no distress Neck: no adenopathy, no carotid bruit, no JVD, supple, symmetrical, trachea midline and thyroid not enlarged, symmetric, no tenderness/mass/nodules Back: mild R sided CVA tenderness Abdomen: flat, hypoactive BS, no masses, moderate TTP RLQ w/o rebound or guarding.   Urinalysis: Pertinent findings include moderate leukocytes with trace intact red blood cells.  Urine micro: 10-20 epithelial cells. 5-10 white blood cells. 2+ bacteria. 1-3 RBCs.  Urine has been sent for culture.     Assessment and Plan:

## 2013-04-04 NOTE — Assessment & Plan Note (Addendum)
A: patient with persistent pain that is excessive for physical exam findings. She does have persistent hematuria that is significantly less that what was in the ED. She now has increased white blood cells on her urinalysis. At this time as she is getting into a chronic pain state. Differentials include interstitial cystitis, lower urinary tract infection and pyelonephritis. Findings highly unlikely that she could have pyelonephritis given the stable nature of her exam. A lower urinary tract infections possible but also unlikely given the patient was recently treated with Keflex had a recent negative urine culture.  P: I have refilled the patient's tramadol I've advised patient to work as tolerated and to call the office if she really feels she cannot work and requires a written excuse. I have ordered a CBC to check white blood cell count as well as a BMP primarily to check creatinine. I sent the patient's urine for culture. I have tried to focus the patient's attention on the fact that while she symptomatically is not improving, objectively her exam is better. I have urged patient to followup with her primary care physician. I will not retreat with antibiotics at this time but will treat you for your culture is positive.

## 2013-04-05 ENCOUNTER — Encounter: Payer: Self-pay | Admitting: Family Medicine

## 2013-04-06 ENCOUNTER — Encounter: Payer: Self-pay | Admitting: *Deleted

## 2013-04-06 LAB — URINE CULTURE: Organism ID, Bacteria: NO GROWTH

## 2013-06-06 ENCOUNTER — Telehealth: Payer: Self-pay

## 2013-06-06 ENCOUNTER — Emergency Department: Payer: Self-pay | Admitting: Emergency Medicine

## 2013-06-06 LAB — COMPREHENSIVE METABOLIC PANEL
Anion Gap: 4 — ABNORMAL LOW (ref 7–16)
Bilirubin,Total: 0.3 mg/dL (ref 0.2–1.0)
Calcium, Total: 8.8 mg/dL (ref 8.5–10.1)
Chloride: 111 mmol/L — ABNORMAL HIGH (ref 98–107)
Co2: 25 mmol/L (ref 21–32)
Creatinine: 1.13 mg/dL (ref 0.60–1.30)
EGFR (Non-African Amer.): >60
Glucose: 88 mg/dL (ref 65–99)
Potassium: 3.5 mmol/L (ref 3.5–5.1)
SGPT (ALT): 23 U/L (ref 12–78)
Total Protein: 7.5 g/dL (ref 6.4–8.2)

## 2013-06-06 LAB — URINALYSIS, COMPLETE
Bacteria: NONE SEEN
Blood: NEGATIVE
Glucose,UR: NEGATIVE mg/dL (ref 0–75)
Ketone: NEGATIVE
Leukocyte Esterase: NEGATIVE
Nitrite: NEGATIVE
Ph: 7 (ref 4.5–8.0)
RBC,UR: 2 /HPF (ref 0–5)
Specific Gravity: 1.015 (ref 1.003–1.030)
WBC UR: 1 /HPF (ref 0–5)

## 2013-06-06 LAB — CBC
HCT: 38.5 % (ref 35.0–47.0)
HGB: 13.3 g/dL (ref 12.0–16.0)
MCH: 31.4 pg (ref 26.0–34.0)
MCV: 91 fL (ref 80–100)
Platelet: 242 10*3/uL (ref 150–440)
RBC: 4.24 10*6/uL (ref 3.80–5.20)
WBC: 8.7 10*3/uL (ref 3.6–11.0)

## 2013-06-06 LAB — LIPASE, BLOOD: Lipase: 188 U/L (ref 73–393)

## 2013-06-06 MED ORDER — KETOPROFEN 75 MG PO CAPS: 75.0000 mg | ORAL_CAPSULE | Freq: Four times a day (QID) | ORAL | Status: DC | PRN

## 2013-06-06 NOTE — Telephone Encounter (Signed)
Patient called requesting Dr Terrace Arabia start filling Ketoprofen 75mg .  She says this is the only strength her drug store keeps in stock.  Would you like to prescribe?  Please advise.  Thank you.

## 2013-06-06 NOTE — Telephone Encounter (Signed)
Chart reviewed, patient of Dr. Sandria Manly, previous name Michele Estrada, [name], last seen in Feb 2014, it is Ok to dispense Ketoprofen 75mg  prn, new rx was faxed.

## 2013-06-07 LAB — WET PREP, GENITAL

## 2013-06-07 LAB — GC/CHLAMYDIA PROBE AMP

## 2013-06-15 ENCOUNTER — Encounter: Payer: Self-pay | Admitting: Obstetrics and Gynecology

## 2013-06-15 ENCOUNTER — Ambulatory Visit (INDEPENDENT_AMBULATORY_CARE_PROVIDER_SITE_OTHER): Payer: PRIVATE HEALTH INSURANCE | Admitting: Obstetrics and Gynecology

## 2013-06-15 VITALS — BP 107/83 | HR 79 | Ht 61.0 in | Wt 140.0 lb

## 2013-06-15 DIAGNOSIS — R1031 Right lower quadrant pain: Secondary | ICD-10-CM

## 2013-06-15 LAB — POCT URINALYSIS DIPSTICK
Bilirubin, UA: NEGATIVE
Glucose, UA: NEGATIVE
Nitrite, UA: NEGATIVE

## 2013-06-15 MED ORDER — OXYCODONE-ACETAMINOPHEN 5-325 MG PO TABS: 1.0000 | ORAL_TABLET | Freq: Three times a day (TID) | ORAL | Status: DC | PRN

## 2013-06-15 NOTE — Progress Notes (Signed)
Patient ID: Michele Estrada, female   DOB: 23-Mar-1971, 42 y.o.   MRN: [id number] 42 yo presenting today for evaluation of right lower quadrant pain. Patient states the pain started in July and she was seen in the emergency room for it. Patient was ruled out for appendicitis and kidney stone with CT. She also had a normal ultrasound at that time as well. She was treated for a UTI and states that the pain significantly improved. The patient states that in August, the pain returned and lasted 7 days. She describes the pain as Michele Estrada, at times radiating to her right groin, rest is the only alleviating factor and ambulating makes the pain worst. Patient describes the pain as similar to menstrual cramping pain. Patient states the pain returned again in September for which she was seen in [name] ED on 10/7. Patient states an ultrasound performed at that time demonstrates adhesions in her uterus. She had an endometrial ablation in 2005 secondary to menorrhagia and uses BTL for contraception. Patient was a patient of Dr. Armen Estrada but stopped seeing her because she states they didn't care for her adequately.   Past Medical History  Diagnosis Date  . Migraines   . Dental abscess 07/04/2012   Past Surgical History  Procedure Laterality Date  . Partial hysterectomy    . Shoulder surgery    . Cholecystectomy     History  Substance Use Topics  . Smoking status: Current Every Day Smoker -- 0.80 packs/day for 17 years    Types: Cigarettes  . Smokeless tobacco: Never Used  . Alcohol Use: 0.5 oz/week    1 drink(s) per week   Family History  Problem Relation Age of Onset  . Diabetes Mother   . Hypertension Mother   . Diabetes Father   . Hypertension Father    GENERAL: Well-developed, well-nourished female in no acute distress.  ABDOMEN: Soft, nontender, nondistended. No pain elicited on exam PELVIC: Normal external female genitalia. Vagina is pink and rugated.  Normal discharge. Normal appearing cervix.  Uterus is normal in size. No adnexal mass or tenderness. Again no pain on exam EXTREMITIES: No cyanosis, clubbing, or edema, 2+ distal pulses.   A/P 42 yo with chronic RLQ pain - UA demonstrates presence of leukocytes. Will send a urine culture - Will obtain records from PCP office and [facility] for review - Discussed with patient that she may need a referral to urologist if positive UTI again - Patient requested a hysterectomy because she is concerned that she may have endometriosis. Discussed with patient that endometriosis is treated medically with contraception or surgically with removal of both ovaries. 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 Explained to patient that given her age removal of both ovaries puts her at risks for osteoporosis, cardiac disease and stroke. - Rx 10 tablets of percocet provided - RTC after reviewing records

## 2013-06-17 LAB — URINE CULTURE: Organism ID, Bacteria: NO GROWTH

## 2013-07-13 ENCOUNTER — Other Ambulatory Visit: Payer: Self-pay

## 2013-10-16 ENCOUNTER — Emergency Department (HOSPITAL_COMMUNITY)
Admission: EM | Admit: 2013-10-16 | Discharge: 2013-10-16 | Disposition: A | Discharge status: Home / Self Care | Payer: PRIVATE HEALTH INSURANCE | Attending: Emergency Medicine | Admitting: Emergency Medicine

## 2013-10-16 ENCOUNTER — Encounter (HOSPITAL_COMMUNITY): Payer: Self-pay | Admitting: Emergency Medicine

## 2013-10-16 ENCOUNTER — Emergency Department (HOSPITAL_COMMUNITY): Payer: PRIVATE HEALTH INSURANCE

## 2013-10-16 DIAGNOSIS — R110 Nausea: Secondary | ICD-10-CM | POA: Insufficient documentation

## 2013-10-16 DIAGNOSIS — Z8659 Personal history of other mental and behavioral disorders: Secondary | ICD-10-CM | POA: Insufficient documentation

## 2013-10-16 DIAGNOSIS — G43909 Migraine, unspecified, not intractable, without status migrainosus: Secondary | ICD-10-CM | POA: Insufficient documentation

## 2013-10-16 DIAGNOSIS — M940 Chondrocostal junction syndrome [Tietze]: Secondary | ICD-10-CM | POA: Insufficient documentation

## 2013-10-16 DIAGNOSIS — R079 Chest pain, unspecified: Secondary | ICD-10-CM

## 2013-10-16 DIAGNOSIS — Z8719 Personal history of other diseases of the digestive system: Secondary | ICD-10-CM | POA: Insufficient documentation

## 2013-10-16 DIAGNOSIS — F17200 Nicotine dependence, unspecified, uncomplicated: Secondary | ICD-10-CM | POA: Insufficient documentation

## 2013-10-16 HISTORY — DX: Anxiety disorder, unspecified: F41.9

## 2013-10-16 LAB — COMPREHENSIVE METABOLIC PANEL
ALT: 17 U/L (ref 0–35)
AST: 17 U/L (ref 0–37)
Albumin: 4 g/dL (ref 3.5–5.2)
Alkaline Phosphatase: 42 U/L (ref 39–117)
BUN: 15 mg/dL (ref 6–23)
CALCIUM: 8.9 mg/dL (ref 8.4–10.5)
CO2: 21 meq/L (ref 19–32)
CREATININE: 1.25 mg/dL — AB (ref 0.50–1.10)
Chloride: 109 mEq/L (ref 96–112)
GFR, EST AFRICAN AMERICAN: 61 mL/min — AB (ref >90)
GFR, EST NON AFRICAN AMERICAN: 53 mL/min — AB (ref >90)
GLUCOSE: 96 mg/dL (ref 70–99)
Potassium: 3.9 mEq/L (ref 3.7–5.3)
Sodium: 143 mEq/L (ref 137–147)
TOTAL PROTEIN: 7.6 g/dL (ref 6.0–8.3)
Total Bilirubin: <0.2 mg/dL — ABNORMAL LOW (ref 0.3–1.2)

## 2013-10-16 LAB — CBC
HCT: 38.8 % (ref 36.0–46.0)
HEMOGLOBIN: 13.4 g/dL (ref 12.0–15.0)
MCH: 31 pg (ref 26.0–34.0)
MCHC: 34.5 g/dL (ref 30.0–36.0)
MCV: 89.8 fL (ref 78.0–100.0)
Platelets: 219 10*3/uL (ref 150–400)
RBC: 4.32 MIL/uL (ref 3.87–5.11)
RDW: 12.4 % (ref 11.5–15.5)
WBC: 6.9 10*3/uL (ref 4.0–10.5)

## 2013-10-16 LAB — POCT I-STAT TROPONIN I
TROPONIN I, POC: 0.01 ng/mL (ref 0.00–0.08)
TROPONIN I, POC: 0 ng/mL (ref 0.00–0.08)

## 2013-10-16 NOTE — Discharge Instructions (Signed)
Recommend you followup with your primary care doctor as well as cardiology. Also recommend scheduling of an outpatient stress test. You may apply ice to the affected area to help with inflammation. Also recommend rest, lack of strenuous activity, and refraining from heavy lifting. Return to the emergency department if symptoms worsen.  Costochondritis Costochondritis, sometimes called Tietze syndrome, is a swelling and irritation (inflammation) of the tissue (cartilage) that connects your ribs with your breastbone (sternum). It causes pain in the chest and rib area. Costochondritis usually goes away on its own over time. It can take up to 6 weeks or longer to get better, especially if you are unable to limit your activities. CAUSES  Some cases of costochondritis have no known cause. Possible causes include:  Injury (trauma).  Exercise or activity such as lifting.  Severe coughing. SIGNS AND SYMPTOMS  Pain and tenderness in the chest and rib area.  Pain that gets worse when coughing or taking deep breaths.  Pain that gets worse with specific movements. DIAGNOSIS  Your health care provider will do a physical exam and ask about your symptoms. Chest X-rays or other tests may be done to rule out other problems. TREATMENT  Costochondritis usually goes away on its own over time. Your health care provider may prescribe medicine to help relieve pain. HOME CARE INSTRUCTIONS   Avoid exhausting physical activity. Try not to strain your ribs during normal activity. This would include any activities using chest, abdominal, and side muscles, especially if heavy weights are used.  Apply ice to the affected area for the first 2 days after the pain begins.  Put ice in a plastic bag.  Place a towel between your skin and the bag.  Leave the ice on for 20 minutes, 2 3 times a day.  Only take over-the-counter or prescription medicines as directed by your health care provider. SEEK MEDICAL CARE  IF:  You have redness or swelling at the rib joints. These are signs of infection.  Your pain does not go away despite rest or medicine. SEEK IMMEDIATE MEDICAL CARE IF:   Your pain increases or you are very uncomfortable.  You have shortness of breath or difficulty breathing.  You cough up blood.  You have worse chest pains, sweating, or vomiting.  You have a fever or persistent symptoms for more than 2 3 days.  You have a fever and your symptoms suddenly get worse. MAKE SURE YOU:   Understand these instructions.  Will watch your condition.  Will get help right away if you are not doing well or get worse. Document Released: 06/03/2005 Document Revised: 06/14/2013 Document Reviewed: 03/28/2013 [facility] Patient Information 2014 [employer], [place].

## 2013-10-16 NOTE — ED Provider Notes (Signed)
CSN: [id number]     Arrival date & time 10/16/13  1331 History   First MD Initiated Contact with Patient 10/16/13 2018     Chief Complaint  Patient presents with  . Chest Pain   (Consider location/radiation/quality/duration/timing/severity/associated sxs/prior Treatment) HPI Comments: Patient is a 43 year old female who presents to the emergency department for chest pain with onset 8 hours ago. Patient states that her chest pain is central and radiates to her right anterior chest and around to the right side of her back. Patient states that deep breathing and movement of her right arm makes the pain worse. She denies any alleviating factors of her symptoms, but states the chest pain has mildly improved spontaneously since onset. Patient denies associated syncope or near syncope, cough, jaw pain, shortness of breath, vomiting, and extremity numbness/weakness. She denies a history of ACS, hyperlipidemia, hypertension, and diabetes mellitus. She denies a family history of ACS.  Patient does state that she was sick last week with bronchitis-type symptoms; states she was coughing a lot last week.  Patient is a 42 y.o. female presenting with chest pain. The history is provided by the patient. No language interpreter was used.  Chest Pain Associated symptoms: nausea   Associated symptoms: no cough, no fever, no numbness, no shortness of breath, not vomiting and no weakness     Past Medical History  Diagnosis Date  . Migraines   . Dental abscess 07/04/2012  . Anxiety    Past Surgical History  Procedure Laterality Date  . Partial hysterectomy    . Shoulder surgery    . Cholecystectomy     Family History  Problem Relation Age of Onset  . Diabetes Mother   . Hypertension Mother   . Diabetes Father   . Hypertension Father    History  Substance Use Topics  . Smoking status: Current Every Day Smoker -- 0.80 packs/day for 17 years    Types: Cigarettes  . Smokeless tobacco: Never Used  .  Alcohol Use: 0.5 oz/week    1 drink(s) per week   OB History   Grav Para Term Preterm Abortions TAB SAB Ect Mult Living                 Review of Systems  Constitutional: Negative for fever.  Respiratory: Negative for cough and shortness of breath.   Cardiovascular: Positive for chest pain.  Gastrointestinal: Positive for nausea. Negative for vomiting and diarrhea.  Neurological: Negative for weakness and numbness.  All other systems reviewed and are negative.    Allergies  Estrogens; Gabapentin; Iron; and Prozac  Home Medications   Current Outpatient Rx  Name  Route  Sig  Dispense  Refill  . ketoprofen (ORUDIS) 75 MG capsule   Oral   Take 1 capsule (75 mg total) by mouth 4 (four) times daily as needed for pain.   30 capsule   12   . oxyCODONE-acetaminophen (ROXICET) 5-325 MG per tablet   Oral   Take 1 tablet by mouth every 8 (eight) hours as needed for pain.   10 tablet   0   . topiramate (TOPAMAX) 100 MG tablet   Oral   Take 250 mg by mouth at bedtime. Prescribed by Dr. Sandria Manly          BP 120/73  Pulse 76  Temp(Src) 97.6 F (36.4 C) (Oral)  Resp 24  Wt 135 lb (61.236 kg)  SpO2 100% Physical Exam  Nursing note and vitals reviewed. Constitutional: She is oriented to  person, place, and time. She appears well-developed and well-nourished. No distress.  HENT:  Head: Normocephalic and atraumatic.  Mouth/Throat: Oropharynx is clear and moist. No oropharyngeal exudate.  Eyes: Conjunctivae and EOM are normal. Pupils are equal, round, and reactive to light. No scleral icterus.  Neck: Normal range of motion. Neck supple.  Cardiovascular: Normal rate, regular rhythm and normal heart sounds.   Pulmonary/Chest: Effort normal and breath sounds normal. No accessory muscle usage. Not tachypneic. No respiratory distress. She has no wheezes. She has no rales.   She exhibits tenderness. She exhibits no bony tenderness, no crepitus, no deformity, no swelling and no  retraction.    No retractions or accessory muscle use. Symmetric chest expansion.  Abdominal: Soft. She exhibits no distension. There is no tenderness. There is no rebound and no guarding.  Musculoskeletal: Normal range of motion.  Neurological: She is alert and oriented to person, place, and time.  Skin: Skin is warm and dry. No rash noted. She is not diaphoretic. No erythema. No pallor.  Psychiatric: She has a normal mood and affect. Her behavior is normal.    ED Course  Procedures (including critical care time) Labs Review Labs Reviewed  COMPREHENSIVE METABOLIC PANEL - Abnormal; Notable for the following:    Creatinine, Ser 1.25 (*)    Total Bilirubin <0.2 (*)    GFR calc non Af Amer 53 (*)    GFR calc Af Amer 61 (*)    All other components within normal limits  CBC  POCT I-STAT TROPONIN I  POCT I-STAT TROPONIN I   Imaging Review Dg Chest 2 View  10/16/2013   CLINICAL DATA:  Chest pain.  EXAM: CHEST  2 VIEW  COMPARISON:  None.  FINDINGS: The heart size and mediastinal contours are within normal limits. Both lungs are clear. No pleural effusion or pneumothorax is noted. The visualized skeletal structures are unremarkable.  IMPRESSION: No active cardiopulmonary disease.   Electronically Signed   By: Roque LiasJames  Green M.D.   On: 10/16/2013 14:13     Date: 10/16/2013  Rate: 85  Rhythm: normal sinus rhythm  QRS Axis: right  Intervals: normal  ST/T Wave abnormalities: normal  Conduction Disutrbances:none  Narrative Interpretation: NSR; no STEMI or ischemic change  Old EKG Reviewed: none available I have personally reviewed and interpreted this EKG   MDM   Final diagnoses:  Chest pain  Costochondritis   43 year old female presents for chest pain with onset 8 hours ago. She is well and nontoxic appearing, hemodynamically stable, and afebrile. Patient without leukocytosis or electrolyte imbalance. Serial troponins negative. X-ray negative for focal consolidation or pneumonia,  mediastinal widening, pneumothorax, pleural effusion, or other acute cardiopulmonary abnormality.   Doubt ACS given lack of risk factors, atypical nature of symptoms, and reassuring workup in the ED today. Patient with heart score of 0. She is PERC negative. Symptoms consistent with musculoskeletal pain and physical exam significant for tenderness to palpation of chest wall. Patient is stable and appropriate for discharge with instruction to followup with her primary care provider as well as a cardiologist to schedule outpatient stress test. Return precautions discussed and patient agreeable to plan with no unaddressed concerns.   Filed Vitals:   10/16/13 1341 10/16/13 2001 10/16/13 2100  BP: 121/91  120/73  Pulse: 87 76 76  Temp: 98.4 F (36.9 C) 97.6 F (36.4 C)   TempSrc:  Oral   Resp: 19 18 24   Weight: 135 lb (61.236 kg)    SpO2: 98% 100% 100%  Antony Madura, PA-C 10/16/13 2136

## 2013-10-16 NOTE — ED Notes (Signed)
Rt side cp that started about  3 o mins ago hurts to breath and move rt arm states has been sick and tha made her migraines worse is to have hysterectomy soon  States hurts to touch rt side of chest

## 2013-10-17 NOTE — ED Provider Notes (Signed)
Medical screening examination/treatment/procedure(s) were performed by non-physician practitioner and as supervising physician I was immediately available for consultation/collaboration.  EKG Interpretation    Date/Time:  Monday October 16 2013 13:38:40 EST Ventricular Rate:  85 PR Interval:  134 QRS Duration: 80 QT Interval:  362 QTC Calculation: 430 R Axis:   95 Text Interpretation:  Normal sinus rhythm Rightward axis Borderline ECG no prior for comparison Confirmed by DOCHERTY  MD, MEGAN ([id number]) on 10/16/2013 9:36:24 PM              Shanna CiscoMegan E Docherty, MD 10/17/13 [id number]

## 2013-10-24 ENCOUNTER — Ambulatory Visit: Payer: PRIVATE HEALTH INSURANCE | Admitting: Cardiovascular Disease

## 2013-10-30 ENCOUNTER — Ambulatory Visit (INDEPENDENT_AMBULATORY_CARE_PROVIDER_SITE_OTHER): Payer: PRIVATE HEALTH INSURANCE | Admitting: Neurology

## 2013-10-30 ENCOUNTER — Encounter: Payer: Self-pay | Admitting: Neurology

## 2013-10-30 ENCOUNTER — Telehealth: Payer: Self-pay | Admitting: Neurology

## 2013-10-30 VITALS — BP 118/71 | HR 81 | Ht 61.0 in | Wt 138.0 lb

## 2013-10-30 DIAGNOSIS — G43109 Migraine with aura, not intractable, without status migrainosus: Secondary | ICD-10-CM

## 2013-10-30 DIAGNOSIS — Z833 Family history of diabetes mellitus: Secondary | ICD-10-CM

## 2013-10-30 DIAGNOSIS — IMO0002 Reserved for concepts with insufficient information to code with codable children: Secondary | ICD-10-CM

## 2013-10-30 MED ORDER — DICLOFENAC POTASSIUM(MIGRAINE) 50 MG PO PACK: 50.0000 mg | PACK | ORAL | Status: DC | PRN

## 2013-10-30 MED ORDER — TOPIRAMATE ER 200 MG PO CAP24: 200.0000 mg | ORAL_CAPSULE | Freq: Every day | ORAL | Status: DC

## 2013-10-30 NOTE — Telephone Encounter (Signed)
Patient calling from pharmacy stating that she cannot afford the  Topamax at $200.  She would like a call back tomorrow regarding where to go from here.

## 2013-10-30 NOTE — Progress Notes (Signed)
PATIENT: Michele Estrada DOB: [birth year]  HISTORICAL  Michele Estrada  is a 43 year old right-handed white divorced female from [place], [place] with a history of a single seizure and migraine, patient of Dr. Sandria Manly.  She had one generalzied seizure in 08/30/2003 at which time she was taking Prozac seroquel. She used cocaine the night of her seizure. EEG read by Dr. Cyndi Bender was normal. Repeat sleep deprived EEG 10/04/2003 was also normal  MRI study of the brain 01/15/2007 showied small areas of high signal change in the deep white matter bilaterally with a scattered distribution and a  large left lateral lesion adjacent  to the left lateral ventricle without associated mass effect or abnormal enhancement .Intracranial MRA 01/15/2007 was normal.  She reported a long history of migraine. Her headaches began at age 57 or 69 near the onset of her menstrual cycle. At 18 they became more severe occurring  during the week of her menstrual cycle. These were treated with Tylenol.This relieved her headaches for several years.  She began to have more frequent headaches, 2-5 times in a week,   over the past few  years, her headachs is better controlled by  Topamax.  But she lost weight. She went from 149 pounds 10/13/2006 to 125 pounds 08/21/2008. She discontinued the topiramate.She  began having recurrent headaches.  She is now taking Topamax brand 100mg  2 and 1/2 qhs. The headaches are always on the right side above the  right eye.  She has nausea and vomiting 20% of the time and blurred vision 20-30% of the time.. Fluorescent lights set off her headaches as do lima beans, walnuts, maccadamia  nuts, and mozzarella cheese.     She has been on Inderal, amitriptyline, magnesium,and Depakote.  verapamil causing rash.  he states she is  averaging 3-5 migraines per week. Ketoprofen has been very helpful, but she could not afford it.  She has tried different triptan in the past, "almost everything", it  does not help.   UPDATE Oct 30 2013:  She is taking Topamax 100 mg 2 and half tablets every night, ketoprofen as needed, which has been very helpful for her headaches, but she took about 3 times each week, she could not have it refilled at the pharmacy, was told it was no longer produced, in past 2 weeks, she has suffered cold, has increased frequency of headaches, she has no recurrent seizure  REVIEW OF SYSTEMS: Full 14 system review of systems performed and notable only for Chest pain, vomiting, insomnia, frequent waking, bruits, anemia, headaches  ALLERGIES: Allergies  Allergen Reactions  . Estrogens     DVT on BCPs  . Gabapentin     May have made moody and maybe increased migraine frequency.  . Iron     hives  . Prozac [Fluoxetine Hcl]     High dose caused seizures.    HOME MEDICATIONS: Current Outpatient Prescriptions on File Prior to Visit  Medication Sig Dispense Refill  . ketoprofen (ORUDIS) 75 MG capsule Take 1 capsule (75 mg total) by mouth 4 (four) times daily as needed for pain.  30 capsule  12  . oxyCODONE-acetaminophen (ROXICET) 5-325 MG per tablet Take 1 tablet by mouth every 8 (eight) hours as needed for pain.  10 tablet  0  . topiramate (TOPAMAX) 100 MG tablet Take 250 mg by mouth at bedtime. Prescribed by Dr. Sandria Manly       No current facility-administered medications on file prior to visit.  PAST MEDICAL HISTORY: Past Medical History  Diagnosis Date  . Migraines   . Dental abscess 07/04/2012  . Anxiety     PAST SURGICAL HISTORY: Past Surgical History  Procedure Laterality Date  . Partial hysterectomy    . Shoulder surgery    . Cholecystectomy      FAMILY HISTORY: Family History  Problem Relation Age of Onset  . Diabetes Mother   . Hypertension Mother   . Diabetes Father   . Hypertension Father     SOCIAL HISTORY:  History   Social History  . Marital Status: Legally Separated    Spouse Name: N/A    Number of Children: N/A  . Years of  Education: college   Occupational History  .     Social History Main Topics  . Smoking status: Current Every Day Smoker -- 0.80 packs/day for 17 years    Types: Cigarettes  . Smokeless tobacco: Never Used  . Alcohol Use: 0.5 oz/week    1 drink(s) per week  . Drug Use: No  . Sexual Activity: Yes    Partners: Male     Comment: long term monagamous relationship   Other Topics Concern  . Not on file   Social History Narrative   Patient lives at home at home with her husband Tasia Catchings) Patient works for [employer] part time and [employer] part time.   Right handed    College student      PHYSICAL EXAM   Filed Vitals:   10/30/13 1545  BP: 118/71  Pulse: 81  Height: 5\' 1"  (1.549 m)  Weight: 138 lb (62.596 kg)    Not recorded    Body mass index is 26.09 kg/(m^2).   Generalized: In no acute distress  Neck: Supple, no carotid bruits   Cardiac: Regular rate rhythm  Pulmonary: Clear to auscultation bilaterally  Musculoskeletal: No deformity  Neurological examination  Mentation: Alert oriented to time, place, history taking, and causual conversation  Cranial nerve II-XII: Pupils were equal round reactive to light. Extraocular movements were full.  Visual field were full on confrontational test. Bilateral fundi were sharp.  Facial sensation and strength were normal. Hearing was intact to finger rubbing bilaterally. Uvula tongue midline.  Head turning and shoulder shrug and were normal and symmetric.Tongue protrusion into cheek strength was normal.  Motor: Normal tone, bulk and strength.  Sensory: Intact to fine touch, pinprick, preserved vibratory sensation, and proprioception at toes.  Coordination: Normal finger to nose, heel-to-shin bilaterally there was no truncal ataxia  Gait: Rising up from seated position without assistance, normal stance, without trunk ataxia, moderate stride, good arm swing, smooth turning, able to perform tiptoe, and heel walking without  difficulty.   Romberg signs: Negative  Deep tendon reflexes: Brachioradialis 2/2, biceps 2/2, triceps 2/2, patellar 2/2, Achilles 2/2, plantar responses were flexor bilaterally.   DIAGNOSTIC DATA (LABS, IMAGING, TESTING) - I reviewed patient records, labs, notes, testing and imaging myself where available.  Lab Results  Component Value Date   WBC 6.9 10/16/2013   HGB 13.4 10/16/2013   HCT 38.8 10/16/2013   MCV 89.8 10/16/2013   PLT 219 10/16/2013      Component Value Date/Time   NA 143 10/16/2013 1618   K 3.9 10/16/2013 1618   CL 109 10/16/2013 1618   CO2 21 10/16/2013 1618   GLUCOSE 96 10/16/2013 1618   BUN 15 10/16/2013 1618   CREATININE 1.25* 10/16/2013 1618   CREATININE 0.92 04/04/2013 1721   CALCIUM 8.9 10/16/2013 1618  PROT 7.6 10/16/2013 1618   ALBUMIN 4.0 10/16/2013 1618   AST 17 10/16/2013 1618   ALT 17 10/16/2013 1618   ALKPHOS 42 10/16/2013 1618   BILITOT <0.2* 10/16/2013 1618   GFRNONAA 53* 10/16/2013 1618   GFRAA 61* 10/16/2013 1618   Lab Results  Component Value Date   CHOL 136 06/03/2012   HDL 40 06/03/2012   LDLCALC 83 06/03/2012   TRIG 67 06/03/2012   CHOLHDL 3.4 06/03/2012   ASSESSMENT AND PLAN  Kathalene R Newman PiesBall is a 43 y.o. female with past medical history of frequent migraine headaches, one episode of seizure, doing very well with Topamax as preventive medications, but she could not tolerate daytime dosing, because of the side effect, nausea, dizziness,  1. Will change her to extended release Topamax 200 mg every night 2. Cambia as needed 3.  return to clinic in 6-9 months with Gerlene Feearolyn   Rylee Huestis, M.D. Ph.D.  [facility] [address], [address] [place], [place] [address] [phone]

## 2013-10-31 ENCOUNTER — Ambulatory Visit: Payer: PRIVATE HEALTH INSURANCE | Admitting: Cardiovascular Disease

## 2013-10-31 MED ORDER — TOPIRAMATE 100 MG PO TABS: 100.0000 mg | ORAL_TABLET | Freq: Two times a day (BID) | ORAL | Status: DC

## 2013-10-31 NOTE — Telephone Encounter (Signed)
Patient calling stating that she can not pay for the topirmate for $200, is there anything else she could take that is not so expensive. Please advise.

## 2013-10-31 NOTE — Telephone Encounter (Signed)
I have called [name], we will change her from extended release of Topamax to regular preparation Topamax 100 mg twice a day, she can try diclofenac tablet as needed for her migraine headaches, diclofenac powder is too expensive for her

## 2013-11-06 ENCOUNTER — Telehealth: Payer: Self-pay

## 2013-11-06 MED ORDER — TOPIRAMATE 100 MG PO TABS: ORAL_TABLET | ORAL | Status: DC

## 2013-11-06 NOTE — Telephone Encounter (Signed)
Yes, it is OK to change to Topamax 250mg  each day, 100mg  /one and 1/2 qhs. New rx was faxed. Let her know

## 2013-11-06 NOTE — Telephone Encounter (Signed)
Patient says 200mg  of Topamax daily is not enough and causes her to have more headaches, she would like to go back to taking 250mg  daily as she said she has taken in the past.  Okay to increase dose?  Please advise.  Thank you.

## 2013-11-06 NOTE — Telephone Encounter (Signed)
I called back.  Got no answer.  Left message.  

## 2013-11-23 ENCOUNTER — Other Ambulatory Visit (HOSPITAL_COMMUNITY): Payer: PRIVATE HEALTH INSURANCE

## 2013-11-27 ENCOUNTER — Encounter (HOSPITAL_COMMUNITY): Admission: RE | Payer: Self-pay | Source: Ambulatory Visit

## 2013-11-27 ENCOUNTER — Ambulatory Visit (HOSPITAL_COMMUNITY)
Admission: RE | Admit: 2013-11-27 | Payer: PRIVATE HEALTH INSURANCE | Source: Ambulatory Visit | Admitting: Obstetrics and Gynecology

## 2013-11-27 SURGERY — HYSTERECTOMY, VAGINAL, LAPAROSCOPY-ASSISTED
Anesthesia: Choice

## 2014-03-07 ENCOUNTER — Other Ambulatory Visit: Payer: Self-pay | Admitting: Obstetrics and Gynecology

## 2014-03-09 ENCOUNTER — Encounter (HOSPITAL_COMMUNITY): Payer: Self-pay | Admitting: Pharmacist

## 2014-03-19 ENCOUNTER — Encounter (HOSPITAL_COMMUNITY)
Admission: RE | Admit: 2014-03-19 | Discharge: 2014-03-19 | Disposition: A | Discharge status: Home / Self Care | Payer: PRIVATE HEALTH INSURANCE | Source: Ambulatory Visit | Attending: Obstetrics and Gynecology | Admitting: Obstetrics and Gynecology

## 2014-03-19 ENCOUNTER — Encounter (HOSPITAL_COMMUNITY): Payer: Self-pay

## 2014-03-19 LAB — CBC
HCT: 38.2 % (ref 36.0–46.0)
HEMOGLOBIN: 13 g/dL (ref 12.0–15.0)
MCH: 31 pg (ref 26.0–34.0)
MCHC: 34 g/dL (ref 30.0–36.0)
MCV: 91.2 fL (ref 78.0–100.0)
Platelets: 180 10*3/uL (ref 150–400)
RBC: 4.19 MIL/uL (ref 3.87–5.11)
RDW: 12.5 % (ref 11.5–15.5)
WBC: 6 10*3/uL (ref 4.0–10.5)

## 2014-03-19 NOTE — Patient Instructions (Signed)
20 Michele Estrada  03/19/2014   Your procedure is scheduled on:  03/22/14  [facility].  Pick up the phone at the desk and dial [phone].   Call this number if you have problems the morning of surgery: [phone]   Remember:   Do not eat food:After Midnight.  Do not drink clear liquids: 4 Hours before arrival.  Take these medicines the morning of surgery with A SIP OF WATER: NA   Do not wear jewelry, make-up or nail polish.  Do not wear lotions, powders, or perfumes. You may wear deodorant.  Do not shave 48 hours prior to surgery.  Do not bring valuables to the hospital.  [facility] is not   responsible for any belongings or valuables brought to the hospital.  Contacts, dentures or bridgework may not be worn into surgery.  Leave suitcase in the car. After surgery it may be brought to your room.  For patients admitted to the hospital, checkout time is 11:00 AM the day of              discharge.   Patients discharged the day of surgery will not be allowed to drive             home.  Name and phone number of your driver: NA  Special Instructions:      Please read over the following fact sheets that you were given:   Surgical Site Infection Prevention

## 2014-03-22 ENCOUNTER — Ambulatory Visit (HOSPITAL_COMMUNITY): Payer: PRIVATE HEALTH INSURANCE | Admitting: Anesthesiology

## 2014-03-22 ENCOUNTER — Encounter (HOSPITAL_COMMUNITY): Payer: PRIVATE HEALTH INSURANCE | Admitting: Anesthesiology

## 2014-03-22 ENCOUNTER — Encounter (HOSPITAL_COMMUNITY)
Admission: RE | Disposition: A | Discharge status: Home / Self Care | Payer: Self-pay | Source: Ambulatory Visit | Attending: Obstetrics and Gynecology

## 2014-03-22 ENCOUNTER — Encounter (HOSPITAL_COMMUNITY): Payer: Self-pay | Admitting: Anesthesiology

## 2014-03-22 ENCOUNTER — Observation Stay (HOSPITAL_COMMUNITY)
Admission: RE | Admit: 2014-03-22 | Discharge: 2014-03-23 | Disposition: A | Discharge status: Home / Self Care | Payer: PRIVATE HEALTH INSURANCE | Source: Ambulatory Visit | Attending: Obstetrics and Gynecology | Admitting: Obstetrics and Gynecology

## 2014-03-22 DIAGNOSIS — G8929 Other chronic pain: Principal | ICD-10-CM | POA: Insufficient documentation

## 2014-03-22 DIAGNOSIS — N949 Unspecified condition associated with female genital organs and menstrual cycle: Secondary | ICD-10-CM | POA: Insufficient documentation

## 2014-03-22 DIAGNOSIS — R51 Headache: Secondary | ICD-10-CM | POA: Insufficient documentation

## 2014-03-22 DIAGNOSIS — N859 Noninflammatory disorder of uterus, unspecified: Secondary | ICD-10-CM | POA: Insufficient documentation

## 2014-03-22 DIAGNOSIS — F17200 Nicotine dependence, unspecified, uncomplicated: Secondary | ICD-10-CM | POA: Insufficient documentation

## 2014-03-22 DIAGNOSIS — D649 Anemia, unspecified: Secondary | ICD-10-CM | POA: Insufficient documentation

## 2014-03-22 DIAGNOSIS — R102 Pelvic and perineal pain unspecified side: Secondary | ICD-10-CM | POA: Diagnosis present

## 2014-03-22 DIAGNOSIS — Z86718 Personal history of other venous thrombosis and embolism: Secondary | ICD-10-CM | POA: Insufficient documentation

## 2014-03-22 DIAGNOSIS — D6859 Other primary thrombophilia: Secondary | ICD-10-CM | POA: Insufficient documentation

## 2014-03-22 DIAGNOSIS — F411 Generalized anxiety disorder: Secondary | ICD-10-CM | POA: Insufficient documentation

## 2014-03-22 HISTORY — PX: LAPAROSCOPIC ASSISTED VAGINAL HYSTERECTOMY: SHX5398

## 2014-03-22 LAB — CBC
HEMATOCRIT: 32.3 % — AB (ref 36.0–46.0)
Hemoglobin: 11.1 g/dL — ABNORMAL LOW (ref 12.0–15.0)
MCH: 31.4 pg (ref 26.0–34.0)
MCHC: 34.4 g/dL (ref 30.0–36.0)
MCV: 91.2 fL (ref 78.0–100.0)
PLATELETS: 158 10*3/uL (ref 150–400)
RBC: 3.54 MIL/uL — ABNORMAL LOW (ref 3.87–5.11)
RDW: 12.1 % (ref 11.5–15.5)
WBC: 13.3 10*3/uL — AB (ref 4.0–10.5)

## 2014-03-22 LAB — PREGNANCY, URINE: PREG TEST UR: NEGATIVE

## 2014-03-22 LAB — CREATININE, SERUM
Creatinine, Ser: 0.81 mg/dL (ref 0.50–1.10)
GFR calc non Af Amer: 90 mL/min — ABNORMAL LOW (ref >90)

## 2014-03-22 SURGERY — HYSTERECTOMY, VAGINAL, LAPAROSCOPY-ASSISTED
Anesthesia: General | Site: Abdomen | Laterality: Bilateral

## 2014-03-22 MED ORDER — MIDAZOLAM HCL 2 MG/2ML IJ SOLN
INTRAMUSCULAR | Status: AC
  Filled 2014-03-22: qty 2

## 2014-03-22 MED ORDER — BUPIVACAINE HCL (PF) 0.25 % IJ SOLN
INTRAMUSCULAR | Status: AC
  Filled 2014-03-22: qty 30

## 2014-03-22 MED ORDER — SODIUM CHLORIDE 0.9 % IJ SOLN: 9.0000 mL | INTRAMUSCULAR | Status: DC | PRN

## 2014-03-22 MED ORDER — HYDROMORPHONE HCL PF 1 MG/ML IJ SOLN
INTRAMUSCULAR | Status: AC
  Filled 2014-03-22: qty 1

## 2014-03-22 MED ORDER — LIDOCAINE HCL (CARDIAC) 20 MG/ML IV SOLN
INTRAVENOUS | Status: DC | PRN
  Administered 2014-03-22: 80 mg via INTRAVENOUS

## 2014-03-22 MED ORDER — ONDANSETRON HCL 4 MG/2ML IJ SOLN
4.0000 mg | Freq: Four times a day (QID) | INTRAMUSCULAR | Status: DC | PRN
Start: 2014-03-22 — End: 2014-03-23

## 2014-03-22 MED ORDER — NEOSTIGMINE METHYLSULFATE 10 MG/10ML IV SOLN
INTRAVENOUS | Status: AC
  Filled 2014-03-22: qty 1

## 2014-03-22 MED ORDER — SIMETHICONE 80 MG PO CHEW: 80.0000 mg | CHEWABLE_TABLET | Freq: Four times a day (QID) | ORAL | Status: DC | PRN

## 2014-03-22 MED ORDER — DOCUSATE SODIUM 100 MG PO CAPS
100.0000 mg | ORAL_CAPSULE | Freq: Two times a day (BID) | ORAL | Status: DC
  Administered 2014-03-23: 100 mg via ORAL
  Filled 2014-03-22 (×2): qty 1

## 2014-03-22 MED ORDER — FENTANYL CITRATE 0.05 MG/ML IJ SOLN
INTRAMUSCULAR | Status: AC
  Filled 2014-03-22: qty 2

## 2014-03-22 MED ORDER — NALOXONE HCL 0.4 MG/ML IJ SOLN: 0.4000 mg | INTRAMUSCULAR | Status: DC | PRN

## 2014-03-22 MED ORDER — SCOPOLAMINE 1 MG/3DAYS TD PT72
MEDICATED_PATCH | TRANSDERMAL | Status: AC
  Administered 2014-03-22: 1.5 mg via TRANSDERMAL
  Filled 2014-03-22: qty 1

## 2014-03-22 MED ORDER — HYDROMORPHONE HCL PF 1 MG/ML IJ SOLN: 0.2000 mg | INTRAMUSCULAR | Status: DC | PRN

## 2014-03-22 MED ORDER — SCOPOLAMINE 1 MG/3DAYS TD PT72
MEDICATED_PATCH | TRANSDERMAL | Status: AC
  Filled 2014-03-22: qty 1

## 2014-03-22 MED ORDER — HYDROMORPHONE HCL PF 1 MG/ML IJ SOLN
0.2500 mg | INTRAMUSCULAR | Status: DC | PRN
  Administered 2014-03-22: 0.5 mg via INTRAVENOUS

## 2014-03-22 MED ORDER — CEFAZOLIN SODIUM-DEXTROSE 2-3 GM-% IV SOLR
2.0000 g | INTRAVENOUS | Status: AC
  Administered 2014-03-22: 2 g via INTRAVENOUS

## 2014-03-22 MED ORDER — LACTATED RINGERS IV SOLN
INTRAVENOUS | Status: DC
  Administered 2014-03-22 (×4): via INTRAVENOUS

## 2014-03-22 MED ORDER — BUPIVACAINE HCL (PF) 0.25 % IJ SOLN
INTRAMUSCULAR | Status: DC | PRN
  Administered 2014-03-22: 5 mL

## 2014-03-22 MED ORDER — DIPHENHYDRAMINE HCL 50 MG/ML IJ SOLN: 12.5000 mg | Freq: Four times a day (QID) | INTRAMUSCULAR | Status: DC | PRN

## 2014-03-22 MED ORDER — ROCURONIUM BROMIDE 100 MG/10ML IV SOLN
INTRAVENOUS | Status: AC
  Filled 2014-03-22: qty 1

## 2014-03-22 MED ORDER — PROPOFOL 10 MG/ML IV EMUL
INTRAVENOUS | Status: AC
  Filled 2014-03-22: qty 20

## 2014-03-22 MED ORDER — DEXAMETHASONE SODIUM PHOSPHATE 10 MG/ML IJ SOLN
INTRAMUSCULAR | Status: AC
  Filled 2014-03-22: qty 1

## 2014-03-22 MED ORDER — GLYCOPYRROLATE 0.2 MG/ML IJ SOLN
INTRAMUSCULAR | Status: DC | PRN
  Administered 2014-03-22: 0.2 mg via INTRAVENOUS
  Administered 2014-03-22: 0.4 mg via INTRAVENOUS

## 2014-03-22 MED ORDER — HYDROMORPHONE 0.3 MG/ML IV SOLN
INTRAVENOUS | Status: DC
  Administered 2014-03-22: 16:00:00 via INTRAVENOUS
  Administered 2014-03-22: 2.56 mg via INTRAVENOUS
  Administered 2014-03-22: 4.8 mg via INTRAVENOUS
  Administered 2014-03-22: 11 mL via INTRAVENOUS
  Filled 2014-03-22: qty 25

## 2014-03-22 MED ORDER — HYDROMORPHONE HCL PF 1 MG/ML IJ SOLN
INTRAMUSCULAR | Status: DC | PRN
  Administered 2014-03-22 (×3): 1 mg via INTRAVENOUS

## 2014-03-22 MED ORDER — FENTANYL CITRATE 0.05 MG/ML IJ SOLN
INTRAMUSCULAR | Status: DC | PRN
  Administered 2014-03-22 (×3): 100 ug via INTRAVENOUS
  Administered 2014-03-22: 50 ug via INTRAVENOUS
  Administered 2014-03-22: 100 ug via INTRAVENOUS

## 2014-03-22 MED ORDER — LIDOCAINE HCL (CARDIAC) 20 MG/ML IV SOLN
INTRAVENOUS | Status: AC
  Filled 2014-03-22: qty 5

## 2014-03-22 MED ORDER — CEFAZOLIN SODIUM-DEXTROSE 2-3 GM-% IV SOLR
INTRAVENOUS | Status: AC
  Filled 2014-03-22: qty 50

## 2014-03-22 MED ORDER — MIDAZOLAM HCL 2 MG/2ML IJ SOLN
INTRAMUSCULAR | Status: DC | PRN
Start: 2014-03-22 — End: 2014-03-22
  Administered 2014-03-22: 1 mg via INTRAVENOUS
  Administered 2014-03-22: 2 mg via INTRAVENOUS

## 2014-03-22 MED ORDER — ONDANSETRON HCL 4 MG/2ML IJ SOLN: 4.0000 mg | Freq: Four times a day (QID) | INTRAMUSCULAR | Status: DC | PRN

## 2014-03-22 MED ORDER — ROCURONIUM BROMIDE 100 MG/10ML IV SOLN
INTRAVENOUS | Status: DC | PRN
  Administered 2014-03-22: 40 mg via INTRAVENOUS

## 2014-03-22 MED ORDER — ENOXAPARIN SODIUM 40 MG/0.4ML ~~LOC~~ SOLN
40.0000 mg | SUBCUTANEOUS | Status: DC
  Administered 2014-03-23: 40 mg via SUBCUTANEOUS
  Filled 2014-03-22: qty 0.4

## 2014-03-22 MED ORDER — LIDOCAINE-EPINEPHRINE 1 %-1:100000 IJ SOLN
INTRAMUSCULAR | Status: AC
  Filled 2014-03-22: qty 1

## 2014-03-22 MED ORDER — FENTANYL CITRATE 0.05 MG/ML IJ SOLN
INTRAMUSCULAR | Status: AC
  Filled 2014-03-22: qty 5

## 2014-03-22 MED ORDER — METOCLOPRAMIDE HCL 5 MG/ML IJ SOLN: 10.0000 mg | Freq: Once | INTRAMUSCULAR | Status: DC | PRN

## 2014-03-22 MED ORDER — PROPOFOL 10 MG/ML IV BOLUS
INTRAVENOUS | Status: DC | PRN
  Administered 2014-03-22: 200 mg via INTRAVENOUS

## 2014-03-22 MED ORDER — LIDOCAINE-EPINEPHRINE 1 %-1:100000 IJ SOLN
INTRAMUSCULAR | Status: DC | PRN
  Administered 2014-03-22: 10 mL

## 2014-03-22 MED ORDER — MEPERIDINE HCL 25 MG/ML IJ SOLN: 6.2500 mg | INTRAMUSCULAR | Status: DC | PRN

## 2014-03-22 MED ORDER — OXYCODONE-ACETAMINOPHEN 5-325 MG PO TABS
1.0000 | ORAL_TABLET | ORAL | Status: DC | PRN
  Administered 2014-03-22 – 2014-03-23 (×3): 2 via ORAL
  Filled 2014-03-22 (×3): qty 2

## 2014-03-22 MED ORDER — NEOSTIGMINE METHYLSULFATE 10 MG/10ML IV SOLN
INTRAVENOUS | Status: DC | PRN
  Administered 2014-03-22: 2 mg via INTRAVENOUS

## 2014-03-22 MED ORDER — SCOPOLAMINE 1 MG/3DAYS TD PT72
1.0000 | MEDICATED_PATCH | Freq: Once | TRANSDERMAL | Status: DC
  Administered 2014-03-22: 1.5 mg via TRANSDERMAL

## 2014-03-22 MED ORDER — ONDANSETRON HCL 4 MG PO TABS: 4.0000 mg | ORAL_TABLET | Freq: Four times a day (QID) | ORAL | Status: DC | PRN

## 2014-03-22 MED ORDER — DEXTROSE IN LACTATED RINGERS 5 % IV SOLN
INTRAVENOUS | Status: DC
  Administered 2014-03-22: 16:00:00 via INTRAVENOUS

## 2014-03-22 MED ORDER — ONDANSETRON HCL 4 MG/2ML IJ SOLN
INTRAMUSCULAR | Status: AC
  Filled 2014-03-22: qty 2

## 2014-03-22 MED ORDER — DEXAMETHASONE SODIUM PHOSPHATE 10 MG/ML IJ SOLN
INTRAMUSCULAR | Status: DC | PRN
  Administered 2014-03-22: 10 mg via INTRAVENOUS

## 2014-03-22 MED ORDER — DIPHENHYDRAMINE HCL 12.5 MG/5ML PO ELIX: 12.5000 mg | ORAL_SOLUTION | Freq: Four times a day (QID) | ORAL | Status: DC | PRN

## 2014-03-22 SURGICAL SUPPLY — 38 items
CATH ROBINSON RED A/P 16FR (CATHETERS) ×3 IMPLANT
CLOTH BEACON ORANGE TIMEOUT ST (SAFETY) ×3 IMPLANT
COVER TABLE BACK 60X90 (DRAPES) ×3 IMPLANT
DECANTER SPIKE VIAL GLASS SM (MISCELLANEOUS) ×6 IMPLANT
DERMABOND ADVANCED (GAUZE/BANDAGES/DRESSINGS) ×2
DERMABOND ADVANCED .7 DNX12 (GAUZE/BANDAGES/DRESSINGS) ×1 IMPLANT
DRSG COVADERM PLUS 2X2 (GAUZE/BANDAGES/DRESSINGS) ×6 IMPLANT
DRSG OPSITE POSTOP 3X4 (GAUZE/BANDAGES/DRESSINGS) ×6 IMPLANT
DURAPREP 26ML APPLICATOR (WOUND CARE) ×3 IMPLANT
ELECT REM PT RETURN 9FT ADLT (ELECTROSURGICAL) ×3
ELECTRODE REM PT RTRN 9FT ADLT (ELECTROSURGICAL) ×1 IMPLANT
EVACUATOR SMOKE 8.L (FILTER) ×3 IMPLANT
FORCEPS CUTTING 33CM 5MM (CUTTING FORCEPS) ×3 IMPLANT
GLOVE BIO SURGEON STRL SZ7 (GLOVE) ×9 IMPLANT
GLOVE BIOGEL PI IND STRL 7.0 (GLOVE) ×10 IMPLANT
GLOVE BIOGEL PI INDICATOR 7.0 (GLOVE) ×20
GLOVE ECLIPSE 7.0 STRL STRAW (GLOVE) ×9 IMPLANT
GLOVE SURG SS PI 7.0 STRL IVOR (GLOVE) ×21 IMPLANT
GOWN STRL REUS W/ TWL LRG LVL3 (GOWN DISPOSABLE) ×7 IMPLANT
GOWN STRL REUS W/TWL LRG LVL3 (GOWN DISPOSABLE) ×14
NS IRRIG 1000ML POUR BTL (IV SOLUTION) ×3 IMPLANT
PACK LAVH (CUSTOM PROCEDURE TRAY) ×3 IMPLANT
PROTECTOR NERVE ULNAR (MISCELLANEOUS) ×3 IMPLANT
SET IRRIG TUBING LAPAROSCOPIC (IRRIGATION / IRRIGATOR) IMPLANT
SUT MNCRL 0 MO-4 VIOLET 18 CR (SUTURE) ×2 IMPLANT
SUT MNCRL 0 VIOLET 6X18 (SUTURE) ×1 IMPLANT
SUT MONOCRYL 0 6X18 (SUTURE) ×2
SUT MONOCRYL 0 MO 4 18  CR/8 (SUTURE) ×4
SUT VIC AB 2-0 CT1 27 (SUTURE) ×6
SUT VIC AB 2-0 CT1 TAPERPNT 27 (SUTURE) ×3 IMPLANT
SUT VICRYL 0 UR6 27IN ABS (SUTURE) ×6 IMPLANT
SUT VICRYL RAPIDE 3 0 (SUTURE) ×3 IMPLANT
TOWEL OR 17X24 6PK STRL BLUE (TOWEL DISPOSABLE) ×9 IMPLANT
TRAY FOLEY BAG SILVER LF 14FR (CATHETERS) ×3 IMPLANT
TROCAR BALLN 12MMX100 BLUNT (TROCAR) ×3 IMPLANT
TROCAR XCEL NON-BLD 5MMX100MML (ENDOMECHANICALS) ×6 IMPLANT
WARMER LAPAROSCOPE (MISCELLANEOUS) ×3 IMPLANT
WATER STERILE IRR 1000ML POUR (IV SOLUTION) ×3 IMPLANT

## 2014-03-22 NOTE — Anesthesia Postprocedure Evaluation (Signed)
  Anesthesia Post-op Note  Patient: Michele Estrada  Procedure(s) Performed: Procedure(s): LAPAROSCOPIC ASSISTED VAGINAL HYSTERECTOMY, BILATERAL SALPINGECTOMY (Bilateral) Patient is awake and responsive. Pain and nausea are reasonably well controlled. Vital signs are stable and clinically acceptable. Oxygen saturation is clinically acceptable. There are no apparent anesthetic complications at this time. Patient is ready for discharge.

## 2014-03-22 NOTE — Anesthesia Procedure Notes (Signed)
Procedure Name: Intubation Date/Time: 03/22/2014 12:12 PM Performed by: Graciela HusbandsFUSSELL, Lanett Lasorsa O Pre-anesthesia Checklist: Patient identified, Timeout performed, Emergency Drugs available, Suction available and Patient being monitored Patient Re-evaluated:Patient Re-evaluated prior to inductionOxygen Delivery Method: Circle system utilized Preoxygenation: Pre-oxygenation with 100% oxygen Intubation Type: IV induction Ventilation: Mask ventilation without difficulty Laryngoscope Size: Mac and 3 Grade View: Grade I Tube type: Oral Number of attempts: 1 Airway Equipment and Method: Stylet Placement Confirmation: ETT inserted through vocal cords under direct vision,  breath sounds checked- equal and bilateral and positive ETCO2 Secured at: 21 cm Tube secured with: Tape Dental Injury: Teeth and Oropharynx as per pre-operative assessment
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
## 2014-03-22 NOTE — Anesthesia Preprocedure Evaluation (Signed)
Anesthesia Evaluation  Patient identified by MRN, date of birth, ID band Patient awake    Reviewed: Allergy & Precautions, H&P , NPO status , Patient's Chart, lab work & pertinent test results  Airway Mallampati: II TM Distance: >3 FB Neck ROM: Full    Dental no notable dental hx. (+) Teeth Intact   Pulmonary Current Smoker,  breath sounds clear to auscultation  Pulmonary exam normal       Cardiovascular negative cardio ROS  Rhythm:Regular Rate:Normal     Neuro/Psych  Headaches, Anxiety    GI/Hepatic negative GI ROS, Neg liver ROS,   Endo/Other  negative endocrine ROS  Renal/GU negative Renal ROS  negative genitourinary   Musculoskeletal negative musculoskeletal ROS (+)   Abdominal   Peds  Hematology  (+) anemia ,   Anesthesia Other Findings   Reproductive/Obstetrics Chronic pelvic pain                           Anesthesia Physical Anesthesia Plan  ASA: II  Anesthesia Plan: General   Post-op Pain Management:    Induction: Intravenous  Airway Management Planned: Oral ETT  Additional Equipment:   Intra-op Plan:   Post-operative Plan: Extubation in OR  Informed Consent: I have reviewed the patients History and Physical, chart, labs and discussed the procedure including the risks, benefits and alternatives for the proposed anesthesia with the patient or authorized representative who has indicated his/her understanding and acceptance.   Dental advisory given  Plan Discussed with: Anesthesiologist, CRNA and Surgeon  Anesthesia Plan Comments:         Anesthesia Quick Evaluation

## 2014-03-22 NOTE — Transfer of Care (Signed)
Immediate Anesthesia Transfer of Care Note  Patient: Michele Estrada  Procedure(s) Performed: Procedure(s): LAPAROSCOPIC ASSISTED VAGINAL HYSTERECTOMY, BILATERAL SALPINGECTOMY (Bilateral)  Patient Location: PACU  Anesthesia Type:General  Level of Consciousness: awake, alert  and oriented  Airway & Oxygen Therapy: Patient Spontanous Breathing and Patient connected to nasal cannula oxygen  Post-op Assessment: Report given to PACU RN and Post -op Vital signs reviewed and stable  Post vital signs: Reviewed and stable  Complications: No apparent anesthesia complications

## 2014-03-23 ENCOUNTER — Encounter (HOSPITAL_COMMUNITY): Payer: Self-pay | Admitting: Obstetrics and Gynecology

## 2014-03-23 MED ORDER — OXYCODONE-ACETAMINOPHEN 5-325 MG PO TABS: 1.0000 | ORAL_TABLET | ORAL | Status: DC | PRN

## 2014-03-23 NOTE — Progress Notes (Signed)
UR chart review completed.  

## 2014-03-23 NOTE — Progress Notes (Signed)
Pt teaching complete  Out in wheelchair   

## 2014-03-23 NOTE — Progress Notes (Signed)
POD#1 Pt is doing well. Tolerating diet. No vaginal bleeding. Adequate pain control.  VSSAF IMP/ doing well. Plan/ Will discharge to home after she voids.

## 2014-03-24 NOTE — Discharge Summary (Signed)
NAMOlevia Perches:  Estrada, Michele                  ACCOUNT NO.:  [id number]  MEDICAL RECORD NO.:  [id number]  LOCATION:  9306                          FACILITY:  [facility]  PHYSICIAN:  Malva LimesMark Coti Burd, M.D.    DATE OF BIRTH:  1971/07/17  DATE OF ADMISSION:  03/22/2014 DATE OF DISCHARGE:  03/23/2014                              DISCHARGE SUMMARY   PRINCIPAL DISCHARGE DIAGNOSES: 1. Chronic pelvic pain. 2. Hematometra. 3. Endometriosis.  PRINCIPLE PROCEDURES:  Laparoscopic-assisted vaginal hysterectomy with bilateral salpingectomy.  HISTORY OF PRESENT ILLNESS:  [name] is a 43 year old white female, G2, P1-0-1-1, who presented [facility] for laparoscopic-assisted vaginal hysterectomy with bilateral salpingectomy secondary to a multiple-year history of cyclic pelvic pain.  The patient did have NovaSure endometrial ablation in the past.  The pain was felt to be secondary to hematometra.  She has no vaginal bleeding and cyclic pain.  However, a CT scan and ultrasound did not reveal any large hematometra.  She had no dyspareunia.  The patient did have a history of questionable DVT while on oral contraceptive pills in the past. However, this was not treated with anticoagulants.  She also did take heparin for history of anticoagulation, but not on antibiotics while she was pregnant.  HOSPITAL COURSE:  The patient was admitted.  She underwent laparoscopic- assisted vaginal hysterectomy with bilateral salpingectomy.  A complete description of this can be found and dictated at operative note.  The patient's procedure was uncomplicated.  The patient expressed desire to go home on postoperative day #1.  At that time, she was eating a regular diet.  She was ambulating without difficulty.  She voided.  She had bowel sounds, but had not had flatus yet.  She had adequate pain control with Percocet.  She had no vaginal bleeding.  Pathology was pending at the time of discharge.  The patient did  receive course of Lovenox in the postoperative period.  However, given the fact that she did not have anticoagulation after the reported DVT, the diagnosis was questioned. The patient was discharged to home.  She was sent home with Percocet to take p.r.n.  She was instructed to follow up in the office in 4 weeks. She was told to call the office with any kind of bleeding, severe pain or bowel dysfunction.          ______________________________ Malva LimesMark Addison Whidbee, M.D.     MA/MEDQ  D:  03/23/2014  T:  03/24/2014  Job:  [id number]

## 2014-03-26 NOTE — Op Note (Signed)
NAMEDEAVEN, BARRON NO.:  [id number]  MEDICAL RECORD NO.:  [id number]  LOCATION:                                 FACILITY:  PHYSICIAN:  Malva Limes, M.D.         DATE OF BIRTH:  DATE OF PROCEDURE:  03/22/2014 DATE OF DISCHARGE:                              OPERATIVE REPORT   PREOPERATIVE DIAGNOSIS:  Chronic pelvic pain.  POSTOPERATIVE DIAGNOSIS:  Chronic pelvic pain.  PROCEDURE:  Laparoscopic assisted vaginal hysterectomy with bilateral salpingectomy.  SURGEON:  Malva Limes, M.D.  ASSISTANT:  Luvenia Redden, M.D.  ANESTHESIA:  General and local.  ANTIBIOTICS:  Ancef 2 g.  DRAINS:  Foley bedside drainage.  ESTIMATED BLOOD LOSS:  75 mL.  COMPLICATIONS:  None.  SPECIMENS:  Bilateral fallopian tubes, uterus, and cervix sent to Pathology.  FINDINGS:  On entering the abdominal cavity, the patient had no evidence of any pelvic or abdominal adhesions.  The bowel and liver appeared to be normal.  Gallbladder was not seen.  The appendix was not visualized. The patient had evidence of past tubal ligation.  She did have an area of endometriosis in the right fallopian tube.  At this point, the tubes which had been previously cauterized and transected were removed with the Gyrus instrument and placed into the posterior cul-de-sac.  Once this was accomplished, the ovarian ligament and round ligament were cauterized and transected with the Gyrus instrument.  The broad ligament was then opened and the uterine artery cauterized and transected.  This procedure was performed on both sides.  Once this was accomplished, the laparoscopic procedure was discontinued and attention was placed on the vagina.  A weighted speculum was placed in the vagina.  The cervix was injected with 1% lidocaine with epinephrine.  The posterior cul-de-sac was entered sharply.  The uterosacral ligaments were bilaterally clamped, cut, and ligated with 0 Monocryl suture.  The cervix  was circumscribed.  Cardinal ligaments were then serially clamped, cut, and ligated with 0 Monocryl suture.  The bladder pillars were bilaterally clamped, cut, and ligated with 0 Monocryl suture.  The anterior cul-de- sac was entered sharply.  The remaining portion of the broad ligament was then bilaterally clamped, cut, and ligated with 0 Monocryl suture and the cervix and uterus removed through the vagina.  All pedicles were checked and felt to be hemostatic.  The posterior cuff was then closed using 2-0 Vicryl in a running, locking fashion.  The remaining vaginal cuff was then closed vertically using 2-0 Vicryl in a running, locking fashion.  The patient then had her abdomen re-insufflated and the scope was placed.  All pedicles again were dry.  There was no active bleeding. Ovaries appeared to be normal.  This concluded the procedure.  The pneumoperitoneum and instruments removed.  The trocars were removed. The fascia was closed using 0 Vicryl suture in interrupted fashion.  The skin was closed using Dermabond.  The patient was awakened and taken to recovery room in stable condition.  Instrument and lap count was correct x3.          ______________________________ Malva Limes, M.D.     MA/MEDQ  D:  03/22/2014  T:  03/23/2014  Job:  [id number]

## 2014-06-29 ENCOUNTER — Other Ambulatory Visit: Payer: Self-pay | Admitting: Obstetrics and Gynecology

## 2014-06-29 DIAGNOSIS — N644 Mastodynia: Secondary | ICD-10-CM

## 2014-07-11 ENCOUNTER — Telehealth: Payer: Self-pay | Admitting: *Deleted

## 2014-07-11 ENCOUNTER — Ambulatory Visit
Admission: RE | Admit: 2014-07-11 | Discharge: 2014-07-11 | Disposition: A | Discharge status: Home / Self Care | Payer: PRIVATE HEALTH INSURANCE | Source: Ambulatory Visit | Attending: Obstetrics and Gynecology | Admitting: Obstetrics and Gynecology

## 2014-07-11 DIAGNOSIS — N644 Mastodynia: Secondary | ICD-10-CM

## 2014-07-11 NOTE — Telephone Encounter (Signed)
Called patient to let her know that her appt would be with the physician due to cm admin time. Next visit after this can be with MM since cm has never seen her.

## 2014-08-01 ENCOUNTER — Ambulatory Visit (INDEPENDENT_AMBULATORY_CARE_PROVIDER_SITE_OTHER): Payer: PRIVATE HEALTH INSURANCE | Admitting: Neurology

## 2014-08-01 ENCOUNTER — Encounter: Payer: Self-pay | Admitting: Neurology

## 2014-08-01 VITALS — BP 113/67 | HR 77 | Ht 61.0 in | Wt 140.0 lb

## 2014-08-01 DIAGNOSIS — G43109 Migraine with aura, not intractable, without status migrainosus: Secondary | ICD-10-CM

## 2014-08-01 DIAGNOSIS — R569 Unspecified convulsions: Secondary | ICD-10-CM

## 2014-08-01 DIAGNOSIS — R930 Abnormal findings on diagnostic imaging of skull and head, not elsewhere classified: Secondary | ICD-10-CM

## 2014-08-01 MED ORDER — DICLOFENAC POTASSIUM(MIGRAINE) 50 MG PO PACK: 50.0000 mg | PACK | Freq: Every day | ORAL | Status: DC | PRN

## 2014-08-01 MED ORDER — TOPIRAMATE 100 MG PO TABS: ORAL_TABLET | ORAL | Status: DC

## 2014-08-01 NOTE — Progress Notes (Signed)
PATIENT: Michele Estrada DOB: March 22, 1971  HISTORICAL  Michele Estrada  is a 43 year old right-handed white divorced [place]  with a history of a single seizure and migraine, patient of Dr. Sandria ManlyLove.  She had one generalzied seizure in 08/30/2003 at which time she was taking Prozac 80mg , seroquel. She used cocaine the night of her seizure. EEG read by Dr. Cyndi Benderod Radtke was normal. Repeat sleep deprived EEG 10/04/2003 was also normal  MRI study of the brain 01/15/2007 showied small areas of high signal change in the deep white matter bilaterally with a scattered distribution and a  large left lateral lesion adjacent  to the left lateral ventricle without associated mass effect or abnormal enhancement. Intracranial MRA 01/15/2007 was normal.  She reported a long history of migraine. Her headaches began at age 43 or 316 near the onset of her menstrual cycle. At 18 they became more severe occurring  during the week of her menstrual cycle. These were treated with Tylenol.This relieved her headaches for several years.  She began to have more frequent headaches, 2-5 times in a week,   over the past few  years, her headachs is better controlled by  Topamax.  But she lost weight. She went from 149 pounds 10/13/2006 to 125 pounds 08/21/2008. She discontinued the topiramate.She  began having recurrent headaches.  She is now taking Topamax brand 100mg  2 and 1/2 qhs. The headaches are always on the right side above the  right eye.  She has nausea and vomiting 20% of the time and blurred vision 20-30% of the time.. Fluorescent lights set off her headaches as do lima beans, walnuts, maccadamia  nuts, and mozzarella cheese.     She has been on Inderal, amitriptyline, magnesium,and Depakote.  verapamil causing rash.  he states she is  averaging 3-5 migraines per week. Ketoprofen has been very helpful, but she could not afford it.  She has tried different triptan in the past, "almost everything",  it does not help.   UPDATE Oct 30 2013:  She is taking Topamax 100 mg 2 and half tablets every night, ketoprofen as needed, which has been very helpful for her headaches, but she took about 3 times each week, she could not have it refilled at the pharmacy, was told it was no longer produced, in past 2 weeks, she has suffered cold, has increased frequency of headaches, she has no recurrent seizure  UPDATE 08/01/2014: Her headache overall is under good control, she is taking Topamax 100 mg, one in the morning, 1 and half tablets each evening, no significant side effect, she is also taking diclofenac potassium as needed, but almost daily basis, she complains of bright light stimulation at her job, she tends to take diclofenac every morning, which works well for her headaches  She had a hysterectomy in July 2015 for endometriosis recovering well  REVIEW OF SYSTEMS: Full 14 system review of systems performed and notable only for facial drooping, ALLERGIES: Allergies  Allergen Reactions  . Estrogens     DVT on BCPs  . Gabapentin     May have made moody and maybe increased migraine frequency.  . Iron     hives  . Prozac [Fluoxetine Hcl] Other (See Comments)    High dose caused seizures.    HOME MEDICATIONS: Current Outpatient Prescriptions on File Prior to Visit  Medication Sig Dispense Refill  . Diclofenac Potassium 50 MG PACK Take 50 mg by mouth daily as needed (migranes).    .Marland Kitchen  topiramate (TOPAMAX) 100 MG tablet 250 mg at bedtime.     No current facility-administered medications on file prior to visit.    PAST MEDICAL HISTORY: Past Medical History  Diagnosis Date  . Migraines   . Dental abscess 07/04/2012  . Anxiety   . Seizure     PAST SURGICAL HISTORY: Past Surgical History  Procedure Laterality Date  . Shoulder surgery    . Cholecystectomy    . Uterine ablation    . Laparoscopic assisted vaginal hysterectomy Bilateral 03/22/2014    Procedure: LAPAROSCOPIC ASSISTED  VAGINAL HYSTERECTOMY, BILATERAL SALPINGECTOMY;  Surgeon: Levi AlandMark E Anderson, MD;  Location: [facility];  Service: Gynecology;  Laterality: Bilateral;  . Vaginal hysterectomy      FAMILY HISTORY: Family History  Problem Relation Age of Onset  . Diabetes Mother   . Hypertension Mother   . Diabetes Father   . Hypertension Father     SOCIAL HISTORY:  History   Social History  . Marital Status: Married    Spouse Name: Michele Estrada    Number of Children: 1  . Years of Education: college   Occupational History  .      [employer]   Social History Main Topics  . Smoking status: Current Every Day Smoker -- 0.80 packs/day for 17 years    Types: Cigarettes  . Smokeless tobacco: Never Used  . Alcohol Use: 0.6 oz/week    1 Not specified per week     Comment: rarely  . Drug Use: No  . Sexual Activity:    Partners: Male     Comment: long term monagamous relationship   Other Topics Concern  . Not on file   Social History Narrative   Patient lives at home at home with her husband Michele Catchings(Craig) Patient works for [employer].   Right handed    College student    Caffeine One cup of coffee daily.   Children one child.     PHYSICAL EXAM   Filed Vitals:   08/01/14 0817  BP: 113/67  Pulse: 77  Height: 5\' 1"  (1.549 m)  Weight: 140 lb (63.504 kg)    Not recorded      Body mass index is 26.47 kg/(m^2).   Generalized: In no acute distress  Neck: Supple, no carotid bruits   Cardiac: Regular rate rhythm  Pulmonary: Clear to auscultation bilaterally  Musculoskeletal: No deformity  Neurological examination  Mentation: Alert oriented to time, place, history taking, and causual conversation  Cranial nerve II-XII: Pupils were equal round reactive to light. Extraocular movements were full.  Visual field were full on confrontational test. Bilateral fundi were sharp.  Facial sensation and strength were normal. Hearing was intact to finger rubbing bilaterally.  Uvula tongue midline.  Head turning and shoulder shrug and were normal and symmetric.Tongue protrusion into cheek strength was normal.  Motor: Normal tone, bulk and strength.  Sensory: Intact to fine touch, pinprick, preserved vibratory sensation, and proprioception at toes.  Coordination: Normal finger to nose, heel-to-shin bilaterally there was no truncal ataxia  Gait: Rising up from seated position without assistance, normal stance, without trunk ataxia, moderate stride, good arm swing, smooth turning, able to perform tiptoe, and heel walking without difficulty.   Romberg signs: Negative  Deep tendon reflexes: Brachioradialis 2/2, biceps 2/2, triceps 2/2, patellar 2/2, Achilles 2/2, plantar responses were flexor bilaterally.   DIAGNOSTIC DATA (LABS, IMAGING, TESTING) - I reviewed patient records, labs, notes, testing and imaging myself where available.  Lab  Results  Component Value Date   WBC 13.3* 03/22/2014   HGB 11.1* 03/22/2014   HCT 32.3* 03/22/2014   MCV 91.2 03/22/2014   PLT 158 03/22/2014      Component Value Date/Time   NA 143 10/16/2013 1618   K 3.9 10/16/2013 1618   CL 109 10/16/2013 1618   CO2 21 10/16/2013 1618   GLUCOSE 96 10/16/2013 1618   BUN 15 10/16/2013 1618   CREATININE 0.81 03/22/2014 1635   CREATININE 0.92 04/04/2013 1721   CALCIUM 8.9 10/16/2013 1618   PROT 7.6 10/16/2013 1618   ALBUMIN 4.0 10/16/2013 1618   AST 17 10/16/2013 1618   ALT 17 10/16/2013 1618   ALKPHOS 42 10/16/2013 1618   BILITOT <0.2* 10/16/2013 1618   GFRNONAA 90* 03/22/2014 1635   GFRAA >90 03/22/2014 1635   Lab Results  Component Value Date   CHOL 136 06/03/2012   HDL 40 06/03/2012   LDLCALC 83 06/03/2012   TRIG 67 06/03/2012   CHOLHDL 3.4 06/03/2012   ASSESSMENT AND PLAN  Neaveh R Stanish is a 43 y.o. female with past medical history of frequent migraine headaches, one episode of seizure in 2004 , doing very well with Topamax as preventive medications. Previous marked  abnormal MRI scan In 2008, including periventricular white matter disease, left lateral ventricle mass   1. keep current dose of Topamax 100 mg, one in the morning, 1 and half every night  2. Diclofenac as needed   3. Repeat MRI of brain with without contrast 4. Return to clinic in one year   Levert Feinstein, M.D. Ph.D.  [facility] [address], [address] [place], [place] [address] [phone]

## 2014-08-03 ENCOUNTER — Ambulatory Visit: Payer: PRIVATE HEALTH INSURANCE | Admitting: Nurse Practitioner

## 2014-08-16 ENCOUNTER — Ambulatory Visit (INDEPENDENT_AMBULATORY_CARE_PROVIDER_SITE_OTHER): Payer: PRIVATE HEALTH INSURANCE

## 2014-08-16 DIAGNOSIS — R569 Unspecified convulsions: Secondary | ICD-10-CM

## 2014-08-16 DIAGNOSIS — R930 Abnormal findings on diagnostic imaging of skull and head, not elsewhere classified: Secondary | ICD-10-CM

## 2014-08-16 DIAGNOSIS — G43109 Migraine with aura, not intractable, without status migrainosus: Secondary | ICD-10-CM

## 2014-08-16 MED ORDER — GADOPENTETATE DIMEGLUMINE 469.01 MG/ML IV SOLN: 13.0000 mL | Freq: Once | INTRAVENOUS | Status: AC | PRN

## 2014-08-20 ENCOUNTER — Telehealth: Payer: Self-pay | Admitting: Neurology

## 2014-08-20 NOTE — Telephone Encounter (Signed)
Patient is calling to get MRI results and to let us know patient has had a non stop migraine for the past 2-3 weeks and wakes up nauseated every morning. Please call patient and advise. It is ok to leave a message. Thank you.

## 2014-08-20 NOTE — Telephone Encounter (Addendum)
Called patient and spoke to her patient states her headache is a level ten. Patient states  She has taking her Rx's for the day. Topiramate 100 mg. And diclofenac.  50 mg.  Stated to patient that Dr.Yan was out of the office for the day and I would send to Work in. Patient states she is very sick on her stomach.

## 2014-08-20 NOTE — Telephone Encounter (Deleted)
See below

## 2014-08-20 NOTE — Progress Notes (Signed)
Quick Note:  Release MRIs to my chart. ______

## 2014-08-20 NOTE — Telephone Encounter (Signed)
Talked with pt over the phone. Delivered MRI result to her. She stated that she started to have HA since last visit with Dr. Terrace ArabiaYan. She usually started to have headache in the morning and then getting worse during the day. She can not take medications early in the stage of headache because she wakes up with it. She can not take topamax in the morning because of nausea. She takes 150mg  topamax at night. She stated that she tried so many medications in the past and most of them not working. In the past, Dr. Sandria ManlyLove will bring her in for depakote injection and it worked in the past.   I told her that if all the medication fails, we probably have to try depakote injection again. I will [facility] and [facility] Terrace ArabiaYan to see if pt can be arranged for depakote injection in our office tomorrow. She expressed appreciation and understanding.  [name], MD PhD Stroke Neurology 08/20/2014 6:52 PM

## 2014-08-21 ENCOUNTER — Telehealth: Payer: Self-pay | Admitting: Neurology

## 2014-08-21 NOTE — Telephone Encounter (Signed)
I called pt and LMVM for her to call back re: scheduling her for time to come in tomorrow.

## 2014-08-21 NOTE — Telephone Encounter (Signed)
Michele Estrada, try to call patient and check on her headaches again, you may page me to pick up the phone

## 2014-08-21 NOTE — Telephone Encounter (Signed)
I have talked with patient, she had 3 weeks history of daily right retro-orbital area headaches, with associated light noise sensitivity, nauseous, 7/10  Previously, she has tried different triptan seen in the past by Dr. Sandria ManlyLove," nothing helps"including Imitrex injection Maxalt tablet,  She responded to IV Depacon infusion in the past  Sandy/Donna: Please call patient to come in for IV infusion   Depacon 500 mg, may repeat once Solu-Medrol 250 mg Compazine 10 mg

## 2014-08-21 NOTE — Telephone Encounter (Signed)
I have called patient and left message at her cellphone

## 2014-08-21 NOTE — Telephone Encounter (Signed)
Patient has called back to speak to Dr.Yan. Patient staes she is still having a headache.

## 2014-08-21 NOTE — Telephone Encounter (Signed)
Patient calling back and stated headaches aren't getting better.  Please call and advise.

## 2014-08-22 ENCOUNTER — Ambulatory Visit (INDEPENDENT_AMBULATORY_CARE_PROVIDER_SITE_OTHER): Payer: PRIVATE HEALTH INSURANCE | Admitting: *Deleted

## 2014-08-22 VITALS — BP 121/85 | HR 85

## 2014-08-22 DIAGNOSIS — G43109 Migraine with aura, not intractable, without status migrainosus: Secondary | ICD-10-CM

## 2014-08-22 MED ORDER — VALPROATE SODIUM 500 MG/5ML IV SOLN
1000.0000 mg | INTRAVENOUS | Status: DC
  Administered 2014-08-22: 1000 mg via INTRAVENOUS

## 2014-08-22 NOTE — Patient Instructions (Signed)
Call back if needed.

## 2014-08-22 NOTE — Telephone Encounter (Signed)
Patient returning Sandy, RN call. °

## 2014-08-22 NOTE — Progress Notes (Signed)
Pt here for depacon infusion.  Level 8 right now.  Has been having daily headaches since Thanksgiving.  Daily meds not helping.  (topamax).  Order for depacon 500mg  IV, may repeat x one, solumedrol 250mg  IV and compazine 10mg  IVP for N/V per Dr. Terrace ArabiaYan.  Pt to treatment room, made comfortable.   Under aseptic technique 24g angiocath inserted to L AC w/ good blood return.  Depacon 500mg  IV started at 1003.  Level 4, another Depacon 500mg  IV given.   Finished at 1035, Level to 1.   Good results.  Pt mood change apparent.  Consulted Dr. Terrace ArabiaYan , no need for solumedrol or compazine.  Pt stated that if Dr. Terrace ArabiaYan wants to give her med for nausea just to let her know.  To check out after going to restroom.  115/77 78HR.

## 2014-08-22 NOTE — Telephone Encounter (Signed)
Pt coming in at 0930 today for depacon infusion.

## 2014-09-04 ENCOUNTER — Encounter: Payer: Self-pay | Admitting: Neurology

## 2014-09-04 ENCOUNTER — Ambulatory Visit (INDEPENDENT_AMBULATORY_CARE_PROVIDER_SITE_OTHER): Payer: PRIVATE HEALTH INSURANCE | Admitting: Neurology

## 2014-09-04 VITALS — BP 99/66 | HR 73 | Ht 61.0 in | Wt 147.0 lb

## 2014-09-04 DIAGNOSIS — R930 Abnormal findings on diagnostic imaging of skull and head, not elsewhere classified: Secondary | ICD-10-CM

## 2014-09-04 DIAGNOSIS — R569 Unspecified convulsions: Secondary | ICD-10-CM

## 2014-09-04 DIAGNOSIS — G43109 Migraine with aura, not intractable, without status migrainosus: Secondary | ICD-10-CM

## 2014-09-04 MED ORDER — DIVALPROEX SODIUM ER 250 MG PO TB24: 250.0000 mg | ORAL_TABLET | Freq: Every day | ORAL | Status: DC

## 2014-09-04 MED ORDER — ELETRIPTAN HYDROBROMIDE 40 MG PO TABS: 40.0000 mg | ORAL_TABLET | ORAL | Status: DC | PRN

## 2014-09-04 NOTE — Progress Notes (Signed)
PATIENT: Michele Estrada DOB: August 26, 1971  HISTORICAL  Michele Estrada PiesBall  is a 43 year old right-handed white divorced female [place] with a history of a single seizure and migraine, patient of Dr. Sandria ManlyLove.  She had one generalzied seizure in 08/30/2003 at which time she was taking Prozac 80mg , seroquel. She used cocaine the night of her seizure. EEG read by Dr. Cyndi Benderod Radtke was normal. Repeat sleep deprived EEG 10/04/2003 was also normal  MRI study of the brain 01/15/2007 showied small areas of high signal change in the deep white matter bilaterally with a scattered distribution and a  large left lateral lesion adjacent  to the left lateral ventricle without associated mass effect or abnormal enhancement. Intracranial MRA 01/15/2007 was normal.  She reported a long history of migraine. Her headaches began at age 43 or [age] near the onset of her menstrual cycle. At 18 they became more severe occurring  during the week of her menstrual cycle. These were treated with Tylenol.This relieved her headaches for several years.  She began to have more frequent headaches, 2-5 times in a week,   over the past few  years, her headachs is better controlled by  Topamax.  But she lost weight. She went from 149 pounds 10/13/2006 to 125 pounds 08/21/2008. She discontinued the topiramate.She  began having recurrent headaches.  She is now taking Topamax brand 100mg  2 and 1/2 qhs. The headaches are always on the right side above the  right eye.  She has nausea and vomiting 20% of the time and blurred vision 20-30% of the time.. Fluorescent lights set off her headaches as do lima beans, walnuts, maccadamia  nuts, and mozzarella cheese.     She has been on Inderal, amitriptyline, magnesium,and Depakote.  verapamil causing rash.  he states she is  averaging 3-5 migraines per week. Ketoprofen has been very helpful, but she could not afford it.  She has tried different triptan in the past, "almost everything",  it does not help.   UPDATE Oct 30 2013:  She is taking Topamax 100 mg 2 and half tablets every night, ketoprofen as needed, which has been very helpful for her headaches, but she took about 3 times each week, she could not have it refilled at the pharmacy, was told it was no longer produced, in past 2 weeks, she has suffered cold, has increased frequency of headaches, she has no recurrent seizure  UPDATE 08/01/2014: Her headache overall is under good control, she is taking Topamax 100 mg, one in the morning, 1 and half tablets each evening, no significant side effect, she is also taking diclofenac potassium as needed, but almost daily basis, she complains of bright light stimulation at her job, she tends to take diclofenac every morning, which works well for her headaches  She had a hysterectomy in July 2015 for endometriosis recovering well  UPDATE Dec 29th 2015: She returned early then expected today, complains of worsening daily headaches, this being ongoing for many years,  She has tried multiple preventative medications in the past, including amitriptyline, Depakote, verapamil, without helping her headaches, I have suggested Botox injection as migraine prevention, she does not want to consider Botox at this point,  She also tried and failed multiple triptan treatment in the past, including Imitrex, Maxalt, Zomig,  REVIEW OF SYSTEMS: Full 14 system review of systems performed and notable only for as above, ALLERGIES: Allergies  Allergen Reactions  . Estrogens     DVT on BCPs  .  Gabapentin     May have made moody and maybe increased migraine frequency.  . Iron     hives  . Prozac [Fluoxetine Hcl] Other (See Comments)    High dose caused seizures.    HOME MEDICATIONS: Current Outpatient Prescriptions on File Prior to Visit  Medication Sig Dispense Refill  . Diclofenac Potassium 50 MG PACK Take 50 mg by mouth daily as needed (migranes). 30 each 11  . topiramate (TOPAMAX) 100 MG  tablet One po qam and one and 1/2 tabs po qhs 80 tablet 11   Current Facility-Administered Medications on File Prior to Visit  Medication Dose Route Frequency Provider Last Rate Last Dose  . valproate (DEPACON) 1,000 mg in sodium chloride 0.9 % 100 mL IVPB  1,000 mg Intravenous Continuous Levert FeinsteinYijun Larsen Zettel, MD   Stopped at 08/22/14 1035    PAST MEDICAL HISTORY: Past Medical History  Diagnosis Date  . Migraines   . Dental abscess 07/04/2012  . Anxiety   . Seizure     PAST SURGICAL HISTORY: Past Surgical History  Procedure Laterality Date  . Shoulder surgery    . Cholecystectomy    . Uterine ablation    . Laparoscopic assisted vaginal hysterectomy Bilateral 03/22/2014    Procedure: LAPAROSCOPIC ASSISTED VAGINAL HYSTERECTOMY, BILATERAL SALPINGECTOMY;  Surgeon: Levi AlandMark E Anderson, MD;  Location: [facility];  Service: Gynecology;  Laterality: Bilateral;  . Vaginal hysterectomy      FAMILY HISTORY: Family History  Problem Relation Age of Onset  . Diabetes Mother   . Hypertension Mother   . Diabetes Father   . Hypertension Father     SOCIAL HISTORY:  History   Social History  . Marital Status: Married    Spouse Name: Tasia CatchingsCraig    Number of Children: 1  . Years of Education: college   Occupational History  .      [employer]   Social History Main Topics  . Smoking status: Current Every Day Smoker -- 0.80 packs/day for 17 years    Types: Cigarettes  . Smokeless tobacco: Never Used  . Alcohol Use: 0.6 oz/week    1 Not specified per week     Comment: rarely  . Drug Use: No  . Sexual Activity:    Partners: Male     Comment: long term monagamous relationship   Other Topics Concern  . Not on file   Social History Narrative   Patient lives at home at home with her husband Tasia Catchings(Craig) Patient works for [employer].   Right handed    College student    Caffeine One cup of coffee daily.   Children one child.     PHYSICAL EXAM   Filed Vitals:    09/04/14 0824  BP: 99/66  Pulse: 73  Height: 5\' 1"  (1.549 m)  Weight: 147 lb (66.679 kg)    Not recorded      Body mass index is 27.79 kg/(m^2).   Generalized: In no acute distress  Neck: Supple, no carotid bruits   Cardiac: Regular rate rhythm  Pulmonary: Clear to auscultation bilaterally  Musculoskeletal: No deformity  Neurological examination  Mentation: Alert oriented to time, place, history taking, and causual conversation  Cranial nerve II-XII: Pupils were equal round reactive to light. Extraocular movements were full.  Visual field were full on confrontational test. Bilateral fundi were sharp.  Facial sensation and strength were normal. Hearing was intact to finger rubbing bilaterally. Uvula tongue midline.  Head turning and shoulder shrug and  were normal and symmetric.Tongue protrusion into cheek strength was normal.  Motor: Normal tone, bulk and strength.  Sensory: Intact to fine touch, pinprick, preserved vibratory sensation, and proprioception at toes.  Coordination: Normal finger to nose, heel-to-shin bilaterally there was no truncal ataxia  Gait: Rising up from seated position without assistance, normal stance, without trunk ataxia, moderate stride, good arm swing, smooth turning, able to perform tiptoe, and heel walking without difficulty.   Romberg signs: Negative  Deep tendon reflexes: Brachioradialis 2/2, biceps 2/2, triceps 2/2, patellar 2/2, Achilles 2/2, plantar responses were flexor bilaterally.   DIAGNOSTIC DATA (LABS, IMAGING, TESTING) - I reviewed patient records, labs, notes, testing and imaging myself where available.  Lab Results  Component Value Date   WBC 13.3* 03/22/2014   HGB 11.1* 03/22/2014   HCT 32.3* 03/22/2014   MCV 91.2 03/22/2014   PLT 158 03/22/2014      Component Value Date/Time   NA 143 10/16/2013 1618   K 3.9 10/16/2013 1618   CL 109 10/16/2013 1618   CO2 21 10/16/2013 1618   GLUCOSE 96 10/16/2013 1618   BUN 15  10/16/2013 1618   CREATININE 0.81 03/22/2014 1635   CREATININE 0.92 04/04/2013 1721   CALCIUM 8.9 10/16/2013 1618   PROT 7.6 10/16/2013 1618   ALBUMIN 4.0 10/16/2013 1618   AST 17 10/16/2013 1618   ALT 17 10/16/2013 1618   ALKPHOS 42 10/16/2013 1618   BILITOT <0.2* 10/16/2013 1618   GFRNONAA 90* 03/22/2014 1635   GFRAA >90 03/22/2014 1635   Lab Results  Component Value Date   CHOL 136 06/03/2012   HDL 40 06/03/2012   LDLCALC 83 06/03/2012   TRIG 67 06/03/2012   CHOLHDL 3.4 06/03/2012   ASSESSMENT AND PLAN  Dorothia R Woodhead is a 43 y.o. female with past medical history of frequent migraine headaches, one episode of seizure in 2004 , doing very well with Topamax as preventive medications. Previous marked abnormal MRI scan In 2008, including periventricular white matter disease, left lateral ventricle mass   1. keep current dose of Topamax 100 mg, one in the morning, 1 and half every night  2. Her headache responded well to IV Depacon infusion in the past, will try Depakote ER 250 mg every night 3.  Relpax as needed 4.  RTC in 2-3 months with Gerlene Fee, M.D. Ph.D.  [facility] [address], [address] [place], [place] [address] [phone]

## 2014-09-10 ENCOUNTER — Telehealth: Payer: Self-pay | Admitting: Neurology

## 2014-09-10 DIAGNOSIS — G43109 Migraine with aura, not intractable, without status migrainosus: Secondary | ICD-10-CM

## 2014-09-10 MED ORDER — DIVALPROEX SODIUM ER 500 MG PO TB24: 500.0000 mg | ORAL_TABLET | Freq: Every day | ORAL | Status: DC
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
## 2014-09-19 ENCOUNTER — Other Ambulatory Visit: Payer: Self-pay | Admitting: Specialist

## 2014-09-19 DIAGNOSIS — M542 Cervicalgia: Secondary | ICD-10-CM

## 2014-09-19 DIAGNOSIS — R51 Headache: Principal | ICD-10-CM

## 2014-09-19 DIAGNOSIS — G8929 Other chronic pain: Secondary | ICD-10-CM

## 2014-09-24 ENCOUNTER — Ambulatory Visit
Admission: RE | Admit: 2014-09-24 | Discharge: 2014-09-24 | Disposition: A | Discharge status: Home / Self Care | Payer: PRIVATE HEALTH INSURANCE | Source: Ambulatory Visit | Attending: Specialist | Admitting: Specialist

## 2014-09-24 ENCOUNTER — Other Ambulatory Visit: Payer: PRIVATE HEALTH INSURANCE

## 2014-09-24 DIAGNOSIS — R51 Headache: Principal | ICD-10-CM

## 2014-09-24 DIAGNOSIS — G8929 Other chronic pain: Secondary | ICD-10-CM

## 2014-09-24 DIAGNOSIS — M542 Cervicalgia: Secondary | ICD-10-CM

## 2014-09-24 DIAGNOSIS — R519 Headache, unspecified: Secondary | ICD-10-CM

## 2014-09-26 ENCOUNTER — Inpatient Hospital Stay: Admission: RE | Admit: 2014-09-26 | Payer: PRIVATE HEALTH INSURANCE | Source: Ambulatory Visit

## 2014-12-05 ENCOUNTER — Ambulatory Visit: Payer: PRIVATE HEALTH INSURANCE | Admitting: Nurse Practitioner

## 2015-04-30 ENCOUNTER — Other Ambulatory Visit: Payer: Self-pay | Admitting: Obstetrics and Gynecology

## 2015-05-02 LAB — CYTOLOGY - PAP

## 2015-05-08 ENCOUNTER — Encounter: Payer: Self-pay | Admitting: Family Medicine

## 2015-05-20 ENCOUNTER — Ambulatory Visit
Admission: RE | Admit: 2015-05-20 | Discharge: 2015-05-20 | Disposition: A | Discharge status: Home / Self Care | Payer: PRIVATE HEALTH INSURANCE | Source: Ambulatory Visit | Attending: Family Medicine | Admitting: Family Medicine

## 2015-05-20 ENCOUNTER — Ambulatory Visit (INDEPENDENT_AMBULATORY_CARE_PROVIDER_SITE_OTHER): Payer: PRIVATE HEALTH INSURANCE | Admitting: Family Medicine

## 2015-05-20 ENCOUNTER — Encounter: Payer: Self-pay | Admitting: Family Medicine

## 2015-05-20 VITALS — BP 114/70 | HR 76 | Temp 98.3°F | Resp 16 | Ht 61.0 in | Wt 168.0 lb

## 2015-05-20 DIAGNOSIS — M5431 Sciatica, right side: Secondary | ICD-10-CM | POA: Diagnosis not present

## 2015-05-20 DIAGNOSIS — F329 Major depressive disorder, single episode, unspecified: Secondary | ICD-10-CM | POA: Insufficient documentation

## 2015-05-20 DIAGNOSIS — M79604 Pain in right leg: Secondary | ICD-10-CM | POA: Insufficient documentation

## 2015-05-20 DIAGNOSIS — F32A Depression, unspecified: Secondary | ICD-10-CM | POA: Insufficient documentation

## 2015-05-20 DIAGNOSIS — F17200 Nicotine dependence, unspecified, uncomplicated: Secondary | ICD-10-CM | POA: Insufficient documentation

## 2015-05-20 DIAGNOSIS — M940 Chondrocostal junction syndrome [Tietze]: Secondary | ICD-10-CM | POA: Insufficient documentation

## 2015-05-20 DIAGNOSIS — D649 Anemia, unspecified: Secondary | ICD-10-CM | POA: Insufficient documentation

## 2015-05-20 DIAGNOSIS — G4700 Insomnia, unspecified: Secondary | ICD-10-CM | POA: Insufficient documentation

## 2015-05-20 DIAGNOSIS — R1031 Right lower quadrant pain: Secondary | ICD-10-CM | POA: Insufficient documentation

## 2015-05-20 DIAGNOSIS — F1410 Cocaine abuse, uncomplicated: Secondary | ICD-10-CM | POA: Insufficient documentation

## 2015-05-20 MED ORDER — NAPROXEN 500 MG PO TABS: 500.0000 mg | ORAL_TABLET | Freq: Two times a day (BID) | ORAL | Status: DC

## 2015-05-20 MED ORDER — CYCLOBENZAPRINE HCL 5 MG PO TABS: 5.0000 mg | ORAL_TABLET | Freq: Three times a day (TID) | ORAL | Status: DC | PRN
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
## 2015-05-21 ENCOUNTER — Telehealth: Payer: Self-pay

## 2015-05-21 DIAGNOSIS — M79604 Pain in right leg: Secondary | ICD-10-CM

## 2015-05-21 DIAGNOSIS — N6310 Unspecified lump in the right breast, unspecified quadrant: Secondary | ICD-10-CM

## 2015-05-21 NOTE — Telephone Encounter (Signed)
-----   Message from Lorie Phenix, MD sent at 05/20/2015  4:55 PM EDT ----- No fracture or arthritis.  Proceed with plan. Thanks.

## 2015-05-21 NOTE — Telephone Encounter (Signed)
OK to refer. Thanks.

## 2015-05-21 NOTE — Telephone Encounter (Signed)
Advised pt of results. Pt is requesting referral to orthopaedist Dr. Lajoyce Corners. Pulled down order, needs to be signed if provider agrees. Allene Dillon, CMA

## 2015-05-24 ENCOUNTER — Other Ambulatory Visit: Payer: Self-pay | Admitting: Orthopedic Surgery

## 2015-05-24 DIAGNOSIS — M545 Low back pain: Secondary | ICD-10-CM

## 2015-05-28 ENCOUNTER — Ambulatory Visit
Admission: RE | Admit: 2015-05-28 | Discharge: 2015-05-28 | Disposition: A | Discharge status: Home / Self Care | Payer: PRIVATE HEALTH INSURANCE | Source: Ambulatory Visit | Attending: Orthopedic Surgery | Admitting: Orthopedic Surgery

## 2015-05-28 DIAGNOSIS — M545 Low back pain: Secondary | ICD-10-CM

## 2015-06-13 ENCOUNTER — Emergency Department (HOSPITAL_COMMUNITY)
Admission: EM | Admit: 2015-06-13 | Discharge: 2015-06-13 | Disposition: A | Discharge status: Home / Self Care | Payer: No Typology Code available for payment source | Attending: Emergency Medicine | Admitting: Emergency Medicine

## 2015-06-13 ENCOUNTER — Encounter (HOSPITAL_COMMUNITY): Payer: Self-pay | Admitting: *Deleted

## 2015-06-13 DIAGNOSIS — M5126 Other intervertebral disc displacement, lumbar region: Secondary | ICD-10-CM | POA: Insufficient documentation

## 2015-06-13 DIAGNOSIS — M5441 Lumbago with sciatica, right side: Secondary | ICD-10-CM | POA: Insufficient documentation

## 2015-06-13 DIAGNOSIS — Z720 Tobacco use: Secondary | ICD-10-CM | POA: Insufficient documentation

## 2015-06-13 DIAGNOSIS — M545 Low back pain: Secondary | ICD-10-CM | POA: Diagnosis present

## 2015-06-13 DIAGNOSIS — Z8659 Personal history of other mental and behavioral disorders: Secondary | ICD-10-CM | POA: Diagnosis not present

## 2015-06-13 DIAGNOSIS — G43909 Migraine, unspecified, not intractable, without status migrainosus: Secondary | ICD-10-CM | POA: Insufficient documentation

## 2015-06-13 DIAGNOSIS — Z8719 Personal history of other diseases of the digestive system: Secondary | ICD-10-CM | POA: Diagnosis not present

## 2015-06-13 DIAGNOSIS — Z8679 Personal history of other diseases of the circulatory system: Secondary | ICD-10-CM | POA: Diagnosis not present

## 2015-06-13 DIAGNOSIS — Z791 Long term (current) use of non-steroidal anti-inflammatories (NSAID): Secondary | ICD-10-CM | POA: Insufficient documentation

## 2015-06-13 HISTORY — DX: Other intervertebral disc displacement, lumbar region: M51.26

## 2015-06-13 HISTORY — DX: Other intervertebral disc degeneration, lumbar region: M51.36

## 2015-06-13 HISTORY — DX: Other intervertebral disc degeneration, lumbar region without mention of lumbar back pain or lower extremity pain: M51.369

## 2015-06-13 MED ORDER — PREDNISONE 50 MG PO TABS: ORAL_TABLET | ORAL | Status: DC

## 2015-06-13 MED ORDER — HYDROMORPHONE HCL 1 MG/ML IJ SOLN
1.0000 mg | Freq: Once | INTRAMUSCULAR | Status: AC
  Administered 2015-06-13: 1 mg via INTRAMUSCULAR
  Filled 2015-06-13: qty 1

## 2015-06-13 MED ORDER — OXYCODONE-ACETAMINOPHEN 5-325 MG PO TABS: 1.0000 | ORAL_TABLET | ORAL | Status: DC | PRN

## 2015-06-13 NOTE — Discharge Instructions (Signed)
Medication for pain and inflammation.

## 2015-06-13 NOTE — ED Notes (Signed)
Declined W/C at D/C and was escorted to lobby by RN. 

## 2015-06-13 NOTE — ED Notes (Signed)
PT reports increased back pain with a HX of disc bulging.Pt has an appt on Tuesday with Ortho for epidural injection. Pt reports base line of RT/LT leg pain that is not new.

## 2015-06-13 NOTE — ED Provider Notes (Signed)
CSN: [id number]     Arrival date & time 06/13/15  4098 History   By signing my name below, I, Jarvis Morgan, attest that this documentation has been prepared under the direction and in the presence of Donnetta Hutching, MD. Electronically Signed: Jarvis Morgan, ED Scribe. 06/13/2015. 10:32 AM.    Chief Complaint  Patient presents with  . Back Pain    The history is provided by the patient. No language interpreter was used.    HPI Comments: Michele Estrada is a 44 y.o. female who presents to the Emergency Department complaining of chronic, gradually worsening, moderate, low back pain. She states that the pain radiates down her right leg. Pt endorses associated antalgic gait. She has been taking Naproxen 500 mg for the pain with no significant relief. Pt notes she was previously taking Flexeril but is out of the medication and states it did not provide relief for her. She states she has an appt with her orthopedist, Dr. Lajoyce Corners, in 5 days for which she is scheduled to have an epidural injection. Pt reports she recently had an MRI which showed she had a bulging disc in her lower back. She denies any numbness, weakness, urinary or bowel incontinence.    Past Medical History  Diagnosis Date  . Migraines   . Dental abscess 07/04/2012  . Anxiety   . Seizure (HCC)   . Bulging lumbar disc     PT has Ortho MD    Past Surgical History  Procedure Laterality Date  . Shoulder surgery    . Cholecystectomy    . Uterine ablation    . Laparoscopic assisted vaginal hysterectomy Bilateral 03/22/2014    Procedure: LAPAROSCOPIC ASSISTED VAGINAL HYSTERECTOMY, BILATERAL SALPINGECTOMY;  Surgeon: Levi Aland, MD;  Location: [facility];  Service: Gynecology;  Laterality: Bilateral;  . Vaginal hysterectomy    . Tubal ligation    . Wrist surgery Left    Family History  Problem Relation Age of Onset  . Diabetes Mother   . Hypertension Mother   . Diabetes Father   . Hypertension Father   . Migraines Sister   .  Endometriosis Sister   . Migraines Brother   . Diabetes Paternal Grandmother   . Lung cancer Maternal Grandmother   . Lung cancer Maternal Grandfather    Social History  Substance Use Topics  . Smoking status: Current Every Day Smoker -- 0.80 packs/day for 17 years    Types: Cigarettes  . Smokeless tobacco: Never Used  . Alcohol Use: 0.6 oz/week    1 Standard drinks or equivalent per week     Comment: rarely   OB History    Gravida Para Term Preterm AB TAB SAB Ectopic Multiple Living   2 1             Review of Systems A complete 10 system review of systems was obtained and all systems are negative except as noted in the HPI and PMH.     Allergies  Estrogens; Fluoxetine; Gabapentin; Iron; and Prozac  Home Medications   Prior to Admission medications   Medication Sig Start Date End Date Taking? Authorizing Provider  cyclobenzaprine (FLEXERIL) 5 MG tablet Take 1 tablet (5 mg total) by mouth 3 (three) times daily as needed for muscle spasms. 05/20/15   Lorie Phenix, MD  naproxen (NAPROSYN) 500 MG tablet Take 1 tablet (500 mg total) by mouth 2 (two) times daily with a meal. 05/20/15   Lorie Phenix, MD  oxyCODONE-acetaminophen (PERCOCET) 5-325 MG  tablet Take 1-2 tablets by mouth every 4 (four) hours as needed. 06/13/15   Donnetta Hutching, MD  predniSONE (DELTASONE) 50 MG tablet One tab daily for 6 days 06/13/15   Donnetta Hutching, MD   Triage Vitals: BP 125/82 mmHg  Pulse 97  Temp(Src) 97.8 F (36.6 C) (Oral)  Resp 16  Ht  (1.549 m)  Wt 165 lb (74.844 kg)  BMI 31.19 kg/m2  SpO2 100%  Physical Exam  Constitutional: She is oriented to person, place, and time. She appears well-developed and well-nourished.  HENT:  Head: Normocephalic and atraumatic.  Eyes: Conjunctivae and EOM are normal. Pupils are equal, round, and reactive to light.  Neck: Normal range of motion. Neck supple.  Cardiovascular: Normal rate and regular rhythm.   Pulmonary/Chest: Effort normal and breath sounds  normal.  Abdominal: Soft. Bowel sounds are normal.  Musculoskeletal: Normal range of motion.  Tender to lower back   Neurological: She is alert and oriented to person, place, and time.  Skin: Skin is warm and dry.  Psychiatric: She has a normal mood and affect. Her behavior is normal.  Nursing note and vitals reviewed.   ED Course  Procedures (including critical care time)  DIAGNOSTIC STUDIES: Oxygen Saturation is 100% on RA, normal by my interpretation.    COORDINATION OF CARE: 9:40 AM- Will order IM injection of Dilaudid along with rx for Percocet and prednisone.  Pt advised of plan for treatment and pt agrees.     Labs Review Labs Reviewed - No data to display  Imaging Review No results found.    EKG Interpretation None      MDM   Final diagnoses:  Right-sided low back pain with right-sided sciatica   Patient is scheduled for an epidural injection this following Tuesday. Rx prednisone and Percocet until then. Patient is ambulatory.  I, Talon Regala, personally performed the services described in this documentation. All medical record entries made by the scribe were at my direction and in my presence.  I have reviewed the chart and discharge instructions and agree that the record reflects my personal performance and is accurate and complete. Nezzie Manera.  06/13/2015. 10:32 AM.      Donnetta Hutching, MD 06/13/15 [phone]

## 2015-06-14 ENCOUNTER — Other Ambulatory Visit: Payer: Self-pay

## 2015-06-14 DIAGNOSIS — Z1231 Encounter for screening mammogram for malignant neoplasm of breast: Secondary | ICD-10-CM

## 2015-07-15 ENCOUNTER — Ambulatory Visit
Admission: RE | Admit: 2015-07-15 | Discharge: 2015-07-15 | Disposition: A | Discharge status: Home / Self Care | Payer: PRIVATE HEALTH INSURANCE | Source: Ambulatory Visit

## 2015-07-15 DIAGNOSIS — Z1231 Encounter for screening mammogram for malignant neoplasm of breast: Secondary | ICD-10-CM

## 2015-09-08 ENCOUNTER — Encounter: Payer: Self-pay | Admitting: Emergency Medicine

## 2015-09-08 ENCOUNTER — Emergency Department
Admission: EM | Admit: 2015-09-08 | Discharge: 2015-09-08 | Disposition: A | Discharge status: Home / Self Care | Payer: No Typology Code available for payment source | Attending: Emergency Medicine | Admitting: Emergency Medicine

## 2015-09-08 DIAGNOSIS — Z7952 Long term (current) use of systemic steroids: Secondary | ICD-10-CM | POA: Insufficient documentation

## 2015-09-08 DIAGNOSIS — G44219 Episodic tension-type headache, not intractable: Secondary | ICD-10-CM | POA: Insufficient documentation

## 2015-09-08 DIAGNOSIS — Z791 Long term (current) use of non-steroidal anti-inflammatories (NSAID): Secondary | ICD-10-CM | POA: Diagnosis not present

## 2015-09-08 DIAGNOSIS — R112 Nausea with vomiting, unspecified: Secondary | ICD-10-CM | POA: Diagnosis not present

## 2015-09-08 DIAGNOSIS — F17210 Nicotine dependence, cigarettes, uncomplicated: Secondary | ICD-10-CM | POA: Insufficient documentation

## 2015-09-08 DIAGNOSIS — R51 Headache: Secondary | ICD-10-CM | POA: Diagnosis present

## 2015-09-08 LAB — CBC WITH DIFFERENTIAL/PLATELET
BASOS ABS: 0 10*3/uL (ref 0–0.1)
Basophils Relative: 1 %
Eosinophils Absolute: 0.1 10*3/uL (ref 0–0.7)
Eosinophils Relative: 1 %
HEMATOCRIT: 40 % (ref 35.0–47.0)
Hemoglobin: 13.5 g/dL (ref 12.0–16.0)
LYMPHS PCT: 20 %
Lymphs Abs: 1.2 10*3/uL (ref 1.0–3.6)
MCH: 30.2 pg (ref 26.0–34.0)
MCHC: 33.9 g/dL (ref 32.0–36.0)
MCV: 89.4 fL (ref 80.0–100.0)
Monocytes Absolute: 0.7 10*3/uL (ref 0.2–0.9)
Monocytes Relative: 12 %
NEUTROS ABS: 4 10*3/uL (ref 1.4–6.5)
Neutrophils Relative %: 66 %
PLATELETS: 222 10*3/uL (ref 150–440)
RBC: 4.48 MIL/uL (ref 3.80–5.20)
RDW: 12.2 % (ref 11.5–14.5)
WBC: 5.9 10*3/uL (ref 3.6–11.0)

## 2015-09-08 LAB — URINALYSIS COMPLETE WITH MICROSCOPIC (ARMC ONLY)
BACTERIA UA: NONE SEEN
Bilirubin Urine: NEGATIVE
GLUCOSE, UA: NEGATIVE mg/dL
LEUKOCYTES UA: NEGATIVE
Nitrite: NEGATIVE
Protein, ur: 30 mg/dL — AB
SPECIFIC GRAVITY, URINE: 1.03 (ref 1.005–1.030)
pH: 5 (ref 5.0–8.0)

## 2015-09-08 LAB — COMPREHENSIVE METABOLIC PANEL
ALK PHOS: 46 U/L (ref 38–126)
ALT: 34 U/L (ref 14–54)
ANION GAP: 7 (ref 5–15)
AST: 30 U/L (ref 15–41)
Albumin: 4.2 g/dL (ref 3.5–5.0)
BUN: 11 mg/dL (ref 6–20)
CALCIUM: 8.6 mg/dL — AB (ref 8.9–10.3)
CO2: 26 mmol/L (ref 22–32)
Chloride: 107 mmol/L (ref 101–111)
Creatinine, Ser: 0.82 mg/dL (ref 0.44–1.00)
Glucose, Bld: 97 mg/dL (ref 65–99)
Potassium: 3.3 mmol/L — ABNORMAL LOW (ref 3.5–5.1)
SODIUM: 140 mmol/L (ref 135–145)
TOTAL PROTEIN: 7.4 g/dL (ref 6.5–8.1)
Total Bilirubin: 0.8 mg/dL (ref 0.3–1.2)

## 2015-09-08 MED ORDER — KETOROLAC TROMETHAMINE 30 MG/ML IJ SOLN
30.0000 mg | Freq: Once | INTRAMUSCULAR | Status: AC
  Administered 2015-09-08: 30 mg via INTRAVENOUS
  Filled 2015-09-08: qty 1

## 2015-09-08 MED ORDER — ONDANSETRON HCL 4 MG/2ML IJ SOLN
4.0000 mg | Freq: Once | INTRAMUSCULAR | Status: AC
  Administered 2015-09-08: 4 mg via INTRAVENOUS
  Filled 2015-09-08: qty 2

## 2015-09-08 MED ORDER — KETOROLAC TROMETHAMINE 10 MG PO TABS: 10.0000 mg | ORAL_TABLET | Freq: Four times a day (QID) | ORAL | Status: DC | PRN

## 2015-09-08 MED ORDER — SODIUM CHLORIDE 0.9 % IV BOLUS (SEPSIS)
1000.0000 mL | Freq: Once | INTRAVENOUS | Status: AC
  Administered 2015-09-08: 1000 mL via INTRAVENOUS

## 2015-09-08 MED ORDER — PROMETHAZINE HCL 12.5 MG PO TABS: 12.5000 mg | ORAL_TABLET | Freq: Four times a day (QID) | ORAL | Status: DC | PRN

## 2015-09-08 NOTE — ED Notes (Signed)
Iv infusing well   No vomiting since arrival to treatment room informed pt that we still need urine spec.

## 2015-09-08 NOTE — ED Notes (Signed)
Co headache, bodyaches, congestion for several days

## 2015-09-08 NOTE — Discharge Instructions (Signed)
General Headache Without Cause A headache is pain or discomfort felt around the head or neck area. The specific cause of a headache may not be found. There are many causes and types of headaches. A few common ones are:  Tension headaches.  Migraine headaches.  Cluster headaches.  Chronic daily headaches. HOME CARE INSTRUCTIONS  Watch your condition for any changes. Take these steps to help with your condition: Managing Pain  Take over-the-counter and prescription medicines only as told by your health care provider.  Lie down in a dark, quiet room when you have a headache.  If directed, apply ice to the head and neck area:  Put ice in a plastic bag.  Place a towel between your skin and the bag.  Leave the ice on for 20 minutes, 2-3 times per day.  Use a heating pad or hot shower to apply heat to the head and neck area as told by your health care provider.  Keep lights dim if bright lights bother you or make your headaches worse. Eating and Drinking  Eat meals on a regular schedule.  Limit alcohol use.  Decrease the amount of caffeine you drink, or stop drinking caffeine. General Instructions  Keep all follow-up visits as told by your health care provider. This is important.  Keep a headache journal to help find out what may trigger your headaches. For example, write down:  What you eat and drink.  How much sleep you get.  Any change to your diet or medicines.  Try massage or other relaxation techniques.  Limit stress.  Sit up straight, and do not tense your muscles.  Do not use tobacco products, including cigarettes, chewing tobacco, or e-cigarettes. If you need help quitting, ask your health care provider.  Exercise regularly as told by your health care provider.  Sleep on a regular schedule. Get 7-9 hours of sleep, or the amount recommended by your health care provider. SEEK MEDICAL CARE IF:   Your symptoms are not helped by medicine.  You have a  headache that is different from the usual headache.  You have nausea or you vomit.  You have a fever. SEEK IMMEDIATE MEDICAL CARE IF:   Your headache becomes severe.  You have repeated vomiting.  You have a stiff neck.  You have a loss of vision.  You have problems with speech.  You have pain in the eye or ear.  You have muscular weakness or loss of muscle control.  You lose your balance or have trouble walking.  You feel faint or pass out.  You have confusion.   This information is not intended to replace advice given to you by your health care provider. Make sure you discuss any questions you have with your health care provider.   Document Released: 08/24/2005 Document Revised: 05/15/2015 Document Reviewed: 12/17/2014 Elsevier Interactive Patient Education 2016 [employer]  Nausea and Vomiting Nausea is a sick feeling that often comes before throwing up (vomiting). Vomiting is a reflex where stomach contents come out of your mouth. Vomiting can cause severe loss of body fluids (dehydration). Children and elderly adults can become dehydrated quickly, especially if they also have diarrhea. Nausea and vomiting are symptoms of a condition or disease. It is important to find the cause of your symptoms. CAUSES   Direct irritation of the stomach lining. This irritation can result from increased acid production (gastroesophageal reflux disease), infection, food poisoning, taking certain medicines (such as nonsteroidal anti-inflammatory drugs), alcohol use, or tobacco use.  from the brain. These signals could be caused by a headache, heat exposure, an inner ear disturbance, increased pressure in the brain from injury, infection, a tumor, or a concussion, pain, emotional stimulus, or metabolic problems. °· An obstruction in the gastrointestinal tract (bowel obstruction). °· Illnesses such as diabetes, hepatitis, gallbladder problems, appendicitis, kidney problems, cancer,  sepsis, atypical symptoms of a heart attack, or eating disorders. °· Medical treatments such as chemotherapy and radiation. °· Receiving medicine that makes you sleep (general anesthetic) during surgery. °DIAGNOSIS °Your caregiver may ask for tests to be done if the problems do not improve after a few days. Tests may also be done if symptoms are severe or if the reason for the nausea and vomiting is not clear. Tests may include: °· Urine tests. °· Blood tests. °· Stool tests. °· Cultures (to look for evidence of infection). °· X-rays or other imaging studies. °Test results can help your caregiver make decisions about treatment or the need for additional tests. °TREATMENT °You need to stay well hydrated. Drink frequently but in small amounts. You may wish to drink water, sports drinks, clear broth, or eat frozen ice pops or gelatin dessert to help stay hydrated. When you eat, eating slowly may help prevent nausea. There are also some antinausea medicines that may help prevent nausea. °HOME CARE INSTRUCTIONS  °· Take all medicine as directed by your caregiver. °· If you do not have an appetite, do not force yourself to eat. However, you must continue to drink fluids. °· If you have an appetite, eat a normal diet unless your caregiver tells you differently. °¨ Eat a variety of complex carbohydrates (rice, wheat, potatoes, bread), lean meats, yogurt, fruits, and vegetables. °¨ Avoid high-fat foods because they are more difficult to digest. °· Drink enough water and fluids to keep your urine clear or pale yellow. °· If you are dehydrated, ask your caregiver for specific rehydration instructions. Signs of dehydration may include: °¨ Severe thirst. °¨ Dry lips and mouth. °¨ Dizziness. °¨ Dark urine. °¨ Decreasing urine frequency and amount. °¨ Confusion. °¨ Rapid breathing or pulse. °SEEK IMMEDIATE MEDICAL CARE IF:  °· You have blood or brown flecks (like coffee grounds) in your vomit. °· You have black or bloody  stools. °· You have a severe headache or stiff neck. °· You are confused. °· You have severe abdominal pain. °· You have chest pain or trouble breathing. °· You do not urinate at least once every 8 hours. °· You develop cold or clammy skin. °· You continue to vomit for longer than 24 to 48 hours. °· You have a fever. °MAKE SURE YOU:  °· Understand these instructions. °· Will watch your condition. °· Will get help right away if you are not doing well or get worse. °  °This information is not intended to replace advice given to you by your health care provider. Make sure you discuss any questions you have with your health care provider. °  °Document Released: 08/24/2005 Document Revised: 11/16/2011 Document Reviewed: 01/21/2011 °Elsevier Interactive Patient Education ©2016 [employer] ° °

## 2015-09-08 NOTE — ED Notes (Signed)
Headache  Body aches with n/v for about 2-3 days   States vomiting started yesterday  Last time vomited was about 1 hour ago   No fever
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
## 2015-11-21 ENCOUNTER — Other Ambulatory Visit: Payer: Self-pay | Admitting: Orthopaedic Surgery

## 2015-11-21 DIAGNOSIS — M545 Low back pain: Secondary | ICD-10-CM

## 2015-11-24 ENCOUNTER — Ambulatory Visit
Admission: RE | Admit: 2015-11-24 | Discharge: 2015-11-24 | Disposition: A | Discharge status: Home / Self Care | Payer: No Typology Code available for payment source | Source: Ambulatory Visit | Attending: Orthopaedic Surgery | Admitting: Orthopaedic Surgery

## 2015-11-24 DIAGNOSIS — M545 Low back pain: Secondary | ICD-10-CM

## 2016-01-13 ENCOUNTER — Other Ambulatory Visit: Payer: Self-pay | Admitting: Orthopaedic Surgery

## 2016-01-13 DIAGNOSIS — M545 Low back pain: Principal | ICD-10-CM

## 2016-01-13 DIAGNOSIS — G8929 Other chronic pain: Secondary | ICD-10-CM

## 2016-01-20 ENCOUNTER — Ambulatory Visit
Admission: RE | Admit: 2016-01-20 | Discharge: 2016-01-20 | Disposition: A | Discharge status: Home / Self Care | Payer: No Typology Code available for payment source | Source: Ambulatory Visit | Attending: Orthopaedic Surgery | Admitting: Orthopaedic Surgery

## 2016-01-20 VITALS — BP 107/71 | HR 68

## 2016-01-20 DIAGNOSIS — G8929 Other chronic pain: Secondary | ICD-10-CM

## 2016-01-20 DIAGNOSIS — M545 Low back pain, unspecified: Secondary | ICD-10-CM

## 2016-01-20 DIAGNOSIS — M5136 Other intervertebral disc degeneration, lumbar region: Secondary | ICD-10-CM

## 2016-01-20 DIAGNOSIS — M5126 Other intervertebral disc displacement, lumbar region: Secondary | ICD-10-CM

## 2016-01-20 MED ORDER — MEPERIDINE HCL 100 MG/ML IJ SOLN
75.0000 mg | Freq: Once | INTRAMUSCULAR | Status: AC
  Administered 2016-01-20: 75 mg via INTRAMUSCULAR

## 2016-01-20 MED ORDER — ONDANSETRON HCL 4 MG/2ML IJ SOLN
4.0000 mg | Freq: Once | INTRAMUSCULAR | Status: AC
  Administered 2016-01-20: 4 mg via INTRAMUSCULAR

## 2016-01-20 MED ORDER — IOPAMIDOL (ISOVUE-M 200) INJECTION 41%
15.0000 mL | Freq: Once | INTRAMUSCULAR | Status: AC
  Administered 2016-01-20: 15 mL via INTRATHECAL

## 2016-01-20 MED ORDER — DIAZEPAM 5 MG PO TABS
5.0000 mg | ORAL_TABLET | Freq: Once | ORAL | Status: AC
  Administered 2016-01-20: 5 mg via ORAL

## 2016-01-20 NOTE — Progress Notes (Signed)
Pt states she has been off Cymbalta for the past 2 days.  

## 2016-01-20 NOTE — Discharge Instructions (Signed)
Myelogram Discharge Instructions  1. Go home and rest quietly for the next 24 hours.  It is important to lie flat for the next 24 hours.  Get up only to go to the restroom.  You may lie in the bed or on a couch on your back, your stomach, your left side or your right side.  You may have one pillow under your head.  You may have pillows between your knees while you are on your side or under your knees while you are on your back.  2. DO NOT drive today.  Recline the seat as far back as it will go, while still wearing your seat belt, on the way home.  3. You may get up to go to the bathroom as needed.  You may sit up for 10 minutes to eat.  You may resume your normal diet and medications unless otherwise indicated.  Drink lots of extra fluids today and tomorrow.  4. The incidence of headache, nausea, or vomiting is about 5% (one in 20 patients).  If you develop a headache, lie flat and drink plenty of fluids until the headache goes away.  Caffeinated beverages may be helpful.  If you develop severe nausea and vomiting or a headache that does not go away with flat bed rest, call [phone].  5. You may resume normal activities after your 24 hours of bed rest is over; however, do not exert yourself strongly or do any heavy lifting tomorrow. If when you get up you have a headache when standing, go back to bed and force fluids for another 24 hours.  6. Call your physician for a follow-up appointment.  The results of your myelogram will be sent directly to your physician by the following day.  7. If you have any questions or if complications develop after you arrive home, please call [phone].  Discharge instructions have been explained to the patient.  The patient, or the person responsible for the patient, fully understands these instructions.       May resume Cymbalta on Jan 21, 2016, after 9:30 am.

## 2016-02-21 ENCOUNTER — Encounter (HOSPITAL_COMMUNITY): Payer: Self-pay | Admitting: Emergency Medicine

## 2016-02-21 ENCOUNTER — Emergency Department (HOSPITAL_COMMUNITY)
Admission: EM | Admit: 2016-02-21 | Discharge: 2016-02-22 | Disposition: A | Discharge status: Home / Self Care | Payer: No Typology Code available for payment source | Attending: Emergency Medicine | Admitting: Emergency Medicine

## 2016-02-21 DIAGNOSIS — F17210 Nicotine dependence, cigarettes, uncomplicated: Secondary | ICD-10-CM | POA: Diagnosis not present

## 2016-02-21 DIAGNOSIS — M545 Low back pain: Secondary | ICD-10-CM | POA: Diagnosis present

## 2016-02-21 DIAGNOSIS — M542 Cervicalgia: Secondary | ICD-10-CM | POA: Insufficient documentation

## 2016-02-21 DIAGNOSIS — M436 Torticollis: Secondary | ICD-10-CM

## 2016-02-21 LAB — CSF CELL COUNT WITH DIFFERENTIAL
RBC COUNT CSF: 455 /mm3 — AB
RBC Count, CSF: 0 /mm3
TUBE #: 4
Tube #: 1
WBC, CSF: 0 /mm3 (ref 0–5)
WBC, CSF: 1 /mm3 (ref 0–5)

## 2016-02-21 LAB — COMPREHENSIVE METABOLIC PANEL
ALBUMIN: 4.4 g/dL (ref 3.5–5.0)
ALT: 27 U/L (ref 14–54)
ANION GAP: 9 (ref 5–15)
AST: 24 U/L (ref 15–41)
Alkaline Phosphatase: 46 U/L (ref 38–126)
BUN: 9 mg/dL (ref 6–20)
CHLORIDE: 105 mmol/L (ref 101–111)
CO2: 23 mmol/L (ref 22–32)
Calcium: 9.8 mg/dL (ref 8.9–10.3)
Creatinine, Ser: 0.8 mg/dL (ref 0.44–1.00)
GFR calc Af Amer: >60 mL/min (ref >60)
GFR calc non Af Amer: >60 mL/min (ref >60)
GLUCOSE: 136 mg/dL — AB (ref 65–99)
POTASSIUM: 3.7 mmol/L (ref 3.5–5.1)
Sodium: 137 mmol/L (ref 135–145)
Total Bilirubin: 0.3 mg/dL (ref 0.3–1.2)
Total Protein: 7.3 g/dL (ref 6.5–8.1)

## 2016-02-21 LAB — I-STAT CG4 LACTIC ACID, ED
LACTIC ACID, VENOUS: 1.59 mmol/L (ref 0.5–2.0)
Lactic Acid, Venous: 0.78 mmol/L (ref 0.5–2.0)

## 2016-02-21 LAB — GLUCOSE, CSF: Glucose, CSF: 60 mg/dL (ref 40–70)

## 2016-02-21 LAB — CBC WITH DIFFERENTIAL/PLATELET
BASOS ABS: 0 10*3/uL (ref 0.0–0.1)
Basophils Relative: 0 %
Eosinophils Absolute: 0 10*3/uL (ref 0.0–0.7)
Eosinophils Relative: 0 %
HEMATOCRIT: 40.5 % (ref 36.0–46.0)
HEMOGLOBIN: 13.3 g/dL (ref 12.0–15.0)
LYMPHS PCT: 20 %
Lymphs Abs: 2.4 10*3/uL (ref 0.7–4.0)
MCH: 29.8 pg (ref 26.0–34.0)
MCHC: 32.8 g/dL (ref 30.0–36.0)
MCV: 90.8 fL (ref 78.0–100.0)
MONO ABS: 0.8 10*3/uL (ref 0.1–1.0)
MONOS PCT: 6 %
NEUTROS ABS: 9.1 10*3/uL — AB (ref 1.7–7.7)
NEUTROS PCT: 74 %
Platelets: 287 10*3/uL (ref 150–400)
RBC: 4.46 MIL/uL (ref 3.87–5.11)
RDW: 12.3 % (ref 11.5–15.5)
WBC: 12.3 10*3/uL — ABNORMAL HIGH (ref 4.0–10.5)

## 2016-02-21 LAB — PROTEIN, CSF: Total  Protein, CSF: 30 mg/dL (ref 15–45)

## 2016-02-21 MED ORDER — SODIUM CHLORIDE 0.9 % IV SOLN
INTRAVENOUS | Status: DC
  Administered 2016-02-21: 20:00:00 via INTRAVENOUS

## 2016-02-21 MED ORDER — HYDROMORPHONE HCL 1 MG/ML IJ SOLN
1.0000 mg | Freq: Once | INTRAMUSCULAR | Status: AC
  Administered 2016-02-21: 1 mg via INTRAVENOUS
  Filled 2016-02-21: qty 1

## 2016-02-21 MED ORDER — PROCHLORPERAZINE EDISYLATE 5 MG/ML IJ SOLN
10.0000 mg | Freq: Once | INTRAMUSCULAR | Status: AC
  Administered 2016-02-21: 10 mg via INTRAVENOUS
  Filled 2016-02-21: qty 2

## 2016-02-21 MED ORDER — KETOROLAC TROMETHAMINE 30 MG/ML IJ SOLN
30.0000 mg | Freq: Once | INTRAMUSCULAR | Status: AC
  Administered 2016-02-21: 30 mg via INTRAVENOUS
  Filled 2016-02-21: qty 1

## 2016-02-21 NOTE — ED Notes (Signed)
Patient arrives with complaint of stiff back and neck. States she had a steroid injection into her lower spine on tues 6/13 for bulging disc @ L5/S1. Since that time she has experienced no relief from her chronic back pain. Yesterday she began to experience increased pain which extends up her back and into her head. Patient unable to bring chin to chest or turn head side to side. Endorses chills and hot flashes at home, but hasn't checked her temperature.

## 2016-02-21 NOTE — ED Provider Notes (Signed)
CSN: [id number]     Arrival date & time 02/21/16  1847 History  By signing my name below, I, Tanda Rockers, attest that this documentation has been prepared under the direction and in the presence of Linwood Dibbles, MD. Electronically Signed: Tanda Rockers, ED Scribe. 02/21/2016. 8:06 PM.   Chief Complaint  Patient presents with  . Torticollis  . Headache   The history is provided by the patient. No language interpreter was used.    HPI Comments: Michele Estrada is a 45 y.o. female with PMHx herniated L5-S1 and chronic back pain, who presents to the Emergency Department complaining of gradual onset, constant, neck pain radiating down to lower back that began yesterday. Pt is also having neck stiffness.Pt reports that she had a steroid injection 3 days ago by Dr. Alvester Morin at [facility] for her lower back pain. She felt okay after the injection but reports pain beginning yesterday. She has had steroid injections in the past without complications. Pt spoke with the on call doctor at [facility] today and was told to come to the ED for further evaluation. Denies fever, nausea, vomiting, diarrhea, or any other associated symptoms.    Past Medical History  Diagnosis Date  . Migraines   . Dental abscess 07/04/2012  . Anxiety   . Seizure (HCC)   . Bulging lumbar disc     PT has Ortho MD    Past Surgical History  Procedure Laterality Date  . Shoulder surgery    . Cholecystectomy    . Uterine ablation    . Laparoscopic assisted vaginal hysterectomy Bilateral 03/22/2014    Procedure: LAPAROSCOPIC ASSISTED VAGINAL HYSTERECTOMY, BILATERAL SALPINGECTOMY;  Surgeon: Levi Aland, MD;  Location: [facility];  Service: Gynecology;  Laterality: Bilateral;  . Vaginal hysterectomy    . Tubal ligation    . Wrist surgery Left    Family History  Problem Relation Age of Onset  . Diabetes Mother   . Hypertension Mother   . Diabetes Father   . Hypertension Father   . Migraines Sister   .  Endometriosis Sister   . Migraines Brother   . Diabetes Paternal Grandmother   . Lung cancer Maternal Grandmother   . Lung cancer Maternal Grandfather    Social History  Substance Use Topics  . Smoking status: Current Every Day Smoker -- 0.80 packs/day for 17 years    Types: Cigarettes  . Smokeless tobacco: Never Used  . Alcohol Use: 0.6 oz/week    1 Standard drinks or equivalent per week     Comment: rarely   OB History    Gravida Para Term Preterm AB TAB SAB Ectopic Multiple Living   2 1             Review of Systems  Constitutional: Negative for fever.  Gastrointestinal: Negative for nausea, vomiting and diarrhea.  Musculoskeletal: Positive for back pain, neck pain and neck stiffness.  All other systems reviewed and are negative.  Allergies  Prozac; Estrogens; Iron; and Neurontin  Home Medications   Prior to Admission medications   Medication Sig Start Date End Date Taking? Authorizing Provider  diphenhydrAMINE (BENADRYL) 25 MG tablet Take 25 mg by mouth every 6 (six) hours as needed for sleep.   Yes Historical Provider, MD  DULoxetine (CYMBALTA) 20 MG capsule Take 20 mg by mouth daily.   Yes Historical Provider, MD  cyclobenzaprine (FLEXERIL) 5 MG tablet Take 1 tablet (5 mg total) by mouth 3 (three) times daily as needed for  muscle spasms. 05/20/15   Lorie Phenix, MD  ketorolac (TORADOL) 10 MG tablet Take 1 tablet (10 mg total) by mouth every 6 (six) hours as needed. 09/08/15   Chinita Pester, FNP  naproxen (NAPROSYN) 500 MG tablet Take 1 tablet (500 mg total) by mouth 2 (two) times daily with a meal. 05/20/15   Lorie Phenix, MD  oxyCODONE-acetaminophen (PERCOCET) 5-325 MG tablet Take 1-2 tablets by mouth every 4 (four) hours as needed. 06/13/15   Donnetta Hutching, MD  promethazine (PHENERGAN) 12.5 MG tablet Take 1 tablet (12.5 mg total) by mouth every 6 (six) hours as needed for nausea or vomiting. 09/08/15   Cari B Triplett, FNP   BP 109/73 mmHg  Pulse 64  Temp(Src) 97.8 F  (36.6 C) (Oral)  Resp 16  Wt 77.111 kg  SpO2 95%   Physical Exam  Constitutional: She appears well-developed and well-nourished. No distress.  HENT:  Head: Normocephalic and atraumatic.  Right Ear: External ear normal.  Left Ear: External ear normal.  Eyes: Conjunctivae are normal. Right eye exhibits no discharge. Left eye exhibits no discharge. No scleral icterus.  Neck: Neck supple. Muscular tenderness present. No rigidity. No tracheal deviation present.  Cardiovascular: Normal rate, regular rhythm and intact distal pulses.   Pulmonary/Chest: Effort normal and breath sounds normal. No stridor. No respiratory distress. She has no wheezes. She has no rales.  Abdominal: Soft. Bowel sounds are normal. She exhibits no distension. There is no tenderness. There is no rebound and no guarding.  Musculoskeletal: She exhibits no edema.       Lumbar back: She exhibits tenderness. She exhibits no swelling, no edema and no deformity.  Small needle stick wound noted over lumbar spine, no erythema or induration or ecchymosis.   Neurological: She is alert. She has normal strength. No cranial nerve deficit (no facial droop, extraocular movements intact, no slurred speech) or sensory deficit. She exhibits normal muscle tone. She displays no seizure activity. Coordination normal.  Skin: Skin is warm and dry. No rash noted.  Psychiatric: She has a normal mood and affect.  Nursing note and vitals reviewed.   ED Course  .Lumbar Puncture Date/Time: 02/21/2016 9:45 PM Performed by: Linwood Dibbles Authorized by: Linwood Dibbles Consent: Verbal consent obtained. Risks and benefits: risks, benefits and alternatives were discussed Consent given by: patient Patient identity confirmed: verbally with patient Time out: Immediately prior to procedure a "time out" was called to verify the correct patient, procedure, equipment, support staff and site/side marked as required. Indications: evaluation for  infection Anesthesia: local infiltration Local anesthetic: lidocaine 1% without epinephrine Anesthetic total: 5 ml Patient sedated: no Preparation: Patient was prepped and draped in the usual sterile fashion. Lumbar space: L4-L5 interspace Patient's position: left lateral decubitus Needle gauge: 22 Needle length: 3.5 in Number of attempts: 2 Fluid appearance: clear Tubes of fluid: 4 Total volume: 5 ml Post-procedure: pressure dressing applied, adhesive bandage applied and site cleaned Patient tolerance: Patient tolerated the procedure well with no immediate complications   (including critical care time)  DIAGNOSTIC STUDIES: Oxygen Saturation is 95% on RA, normal by my interpretation.    COORDINATION OF CARE: 8:05 PM-Discussed treatment plan which includes CMP, Lactic acid, and CBC with pt at bedside and pt agreed to plan.   Labs Review Labs Reviewed  COMPREHENSIVE METABOLIC PANEL - Abnormal; Notable for the following:    Glucose, Bld 136 (*)    All other components within normal limits  CBC WITH DIFFERENTIAL/PLATELET - Abnormal; Notable for the  following:    WBC 12.3 (*)    Neutro Abs 9.1 (*)    All other components within normal limits  CSF CELL COUNT WITH DIFFERENTIAL - Abnormal; Notable for the following:    Appearance, CSF CLEAR (*)    RBC Count, CSF 455 (*)    All other components within normal limits  CSF CELL COUNT WITH DIFFERENTIAL - Abnormal; Notable for the following:    Appearance, CSF CLEAR (*)    All other components within normal limits  CSF CULTURE  GLUCOSE, CSF  PROTEIN, CSF  I-STAT CG4 LACTIC ACID, ED  I-STAT CG4 LACTIC ACID, ED   I have personally reviewed and evaluated these images and lab results as part of my medical decision-making.    MDM   Final diagnoses:  Neck stiffness  Low back pain, unspecified back pain laterality, with sciatica presence unspecified   Sx were concerning for meningitis with her complaints of neck stiffness and back  pain.  Doubt epidural abscess as I would not expect her neck pain and stiffness from a procedure in her lumbar spine.  Elevated WBC.  Pt agreed to proceed with LP. LP results are reassuring.   Doubt meningitis. Will dc home.  Follow up with her orthopedist.  Monitor for fever, worsening symptoms   I personally performed the services described in this documentation, which was scribed in my presence.  The recorded information has been reviewed and is accurate.     Linwood DibblesJon Sai Moura, MD 02/21/16 [phone]

## 2016-02-21 NOTE — ED Notes (Signed)
Dr. Lynelle DoctorKnapp , J. Explained lumbar tap procedure to pt. , pt. signed consent form for procedure. , LP tray set up at bedside .

## 2016-02-25 LAB — CSF CULTURE
GRAM STAIN: NONE SEEN
SPECIAL REQUESTS: NORMAL

## 2016-02-25 LAB — CSF CULTURE W GRAM STAIN: Culture: NO GROWTH

## 2016-03-04 ENCOUNTER — Ambulatory Visit
Admission: RE | Admit: 2016-03-04 | Discharge: 2016-03-04 | Disposition: A | Discharge status: Home / Self Care | Payer: No Typology Code available for payment source | Source: Ambulatory Visit | Attending: Neurology | Admitting: Neurology

## 2016-03-04 ENCOUNTER — Ambulatory Visit (INDEPENDENT_AMBULATORY_CARE_PROVIDER_SITE_OTHER): Payer: PRIVATE HEALTH INSURANCE | Admitting: Neurology

## 2016-03-04 ENCOUNTER — Encounter: Payer: Self-pay | Admitting: Neurology

## 2016-03-04 VITALS — BP 116/81 | HR 86 | Ht 61.0 in | Wt 178.5 lb

## 2016-03-04 DIAGNOSIS — M25551 Pain in right hip: Secondary | ICD-10-CM | POA: Insufficient documentation

## 2016-03-04 DIAGNOSIS — G43109 Migraine with aura, not intractable, without status migrainosus: Secondary | ICD-10-CM

## 2016-03-04 MED ORDER — NORTRIPTYLINE HCL 25 MG PO CAPS: 50.0000 mg | ORAL_CAPSULE | Freq: Every day | ORAL | Status: DC

## 2016-03-04 NOTE — Progress Notes (Signed)
Chief Complaint  Patient presents with  . Back Pain    Reports worsening back pain, over the last six months, that radiates down her right leg and into her foot. Denies injury to her back.  She has had past abnormal scans (two MRI scans and myelogram).  She has been treated with oral medications and four prior epidural steroid injections with only minimal relief.  . Migraine    States she has not had a migraine since having bilateral daith piercings one year ago.      PATIENT: Michele Estrada DOB: 1971/05/20  HISTORICAL  Michele Estrada is a 45 years old right-handed female, referred by orthopedic surgeon Dr. Annell GreeningMark Yates for evaluation of low back pain, radiating pain to right leg in June 28th 2017  She was a patient of [facility] previously, last office visit was in December 2015  She had one generalzied seizure in 08/30/2003 at which time she was taking Prozac 80mg , seroquel. She used cocaine the night of her seizure. EEG read by Dr. Cyndi Benderod Radtke was normal. Repeat sleep deprived EEG 10/04/2003 was also normal  MRI study of the brain 01/15/2007 showied small areas of high signal change in the deep white matter bilaterally with a scattered distribution and a  large left lateral lesion adjacent  to the left lateral ventricle without associated mass effect or abnormal enhancement. Intracranial,  MRA 01/15/2007 was normal.  She reported a long history of migraine. Her headaches began at age 45 or [age] near the onset of her menstrual cycle. At 18 they became more severe occurring  during the week of her menstrual cycle. These were treated with Tylenol.This relieved her headaches for several years.  She began to have more frequent headaches, 2-5 times in a week,   over the past few  years, her headachs was well controlled by Topamax 100 mg twice a day, her last migraine was February 2016, She has stopped Topamax,  there was no recurrent headache, no seizure-like activity,  She came in with new complains of low  back pain since early 2017, she complains of stabbing severe pain from right side of her lower back to right lateral thigh, right lateral leg, to top of her right foot,involving all 5 toes, constant, walking made it worse,she denies left leg involvement, no bowel bladder incontinence   I reviewed and  summarizethe  referral in note,she had  had 4 epidural injection, tried  physical therapy,gabapentin causing a rash, Lyrica caused lightheadedness, chiropractic treatment, most recent epidural injection was in in early June 2017, 3 days after injection in February 21 2016, she [facility]  stiff neck,headache,  I reviewed CSF study showed normal WBC 0 1, RBC 1, total protein 30 glucose 60, normal CMP, CBC showed mild elevated WBC 12 point 8 ,   I personally reviewed MRI and CT myelogram in May 2017:Shallow ventral defect at L5-S1 appears noncompressive.  REVIEW OF SYSTEMS: Full 14 system review of systems performed and notable only for weight gain, anemia, numbness, insomnia  ALLERGIES: Allergies  Allergen Reactions  . Prozac [Fluoxetine Hcl] Other (See Comments)    High dose caused seizures.  . Estrogens Other (See Comments)    DVT on BCPs  . Iron Hives  . Neurontin [Gabapentin] Other (See Comments)    May have made moody and maybe increased migraine frequency.    HOME MEDICATIONS: Current Outpatient Prescriptions on File Prior to Visit  Medication Sig Dispense Refill  . diphenhydrAMINE (BENADRYL) 25  MG tablet Take 25 mg by mouth every 6 (six) hours as needed for sleep.     No current facility-administered medications on file prior to visit.    PAST MEDICAL HISTORY: Past Medical History  Diagnosis Date  . Migraines   . Dental abscess 07/04/2012  . Anxiety   . Seizure (HCC)   . Bulging lumbar disc     PT has Ortho MD     PAST SURGICAL HISTORY: Past Surgical History  Procedure Laterality Date  . Shoulder surgery    . Cholecystectomy    . Uterine  ablation    . Laparoscopic assisted vaginal hysterectomy Bilateral 03/22/2014    Procedure: LAPAROSCOPIC ASSISTED VAGINAL HYSTERECTOMY, BILATERAL SALPINGECTOMY;  Surgeon: Levi Aland, MD;  Location: [facility];  Service: Gynecology;  Laterality: Bilateral;  . Vaginal hysterectomy    . Tubal ligation    . Wrist surgery Left     FAMILY HISTORY: Family History  Problem Relation Age of Onset  . Diabetes Mother   . Hypertension Mother   . Diabetes Father   . Hypertension Father   . Migraines Sister   . Endometriosis Sister   . Migraines Brother   . Diabetes Paternal Grandmother   . Lung cancer Maternal Grandmother   . Lung cancer Maternal Grandfather     SOCIAL HISTORY:  Social History   Social History  . Marital Status: Married    Spouse Name: Tasia Catchings  . Number of Children: 1  . Years of Education: Bachelors   Occupational History  . Billing Manager     [employer]   Social History Main Topics  . Smoking status: Current Every Day Smoker -- 0.80 packs/day for 17 years    Types: Cigarettes  . Smokeless tobacco: Never Used  . Alcohol Use: 0.6 oz/week    1 Standard drinks or equivalent per week     Comment: rarely  . Drug Use: No     Comment: Hx of use.  Marland Kitchen Sexual Activity:    Partners: Male     Comment: long term monagamous relationship   Other Topics Concern  . Not on file   Social History Narrative   Patient lives at home at home with her husband Tasia Catchings) and son.   Patient works for [employer].   Right handed   Caffeine One cup of coffee daily.           PHYSICAL EXAM   Filed Vitals:   03/04/16 1354  BP: 116/81  Pulse: 86  Height:  (1.549 m)  Weight: 178 lb 8 oz (80.967 kg)    Not recorded      Body mass index is 33.74 kg/(m^2).   PHYSICAL EXAMNIATION:  Gen: NAD, conversant, well nourised, obese, well groomed                     Cardiovascular: Regular rate rhythm, no peripheral edema, warm,  nontender. Eyes: Conjunctivae clear without exudates or hemorrhage Neck: Supple, no carotid bruise. Pulmonary: Clear to auscultation bilaterally   NEUROLOGICAL EXAM:  MENTAL STATUS: Speech:    Speech is normal; fluent and spontaneous with normal comprehension.  Cognition:     Orientation to time, place and person     Normal recent and remote memory     Normal Attention span and concentration     Normal Language, naming, repeating,spontaneous speech     Fund of knowledge   CRANIAL NERVES: CN II: Visual fields are full  to confrontation. Fundoscopic exam is normal with sharp discs and no vascular changes. Pupils are round equal and briskly reactive to light. CN III, IV, VI: extraocular movement are normal. No ptosis. CN V: Facial sensation is intact to pinprick in all 3 divisions bilaterally. Corneal responses are intact.  CN VII: Face is symmetric with normal eye closure and smile. CN VIII: Hearing is normal to rubbing fingers CN IX, X: Palate elevates symmetrically. Phonation is normal. CN XI: Head turning and shoulder shrug are intact CN XII: Tongue is midline with normal movements and no atrophy.  MOTOR: There is no pronator drift of out-stretched arms. Muscle bulk and tone are normal. Muscle strength is normal.  REFLEXES: Reflexes are 2+ and symmetric at the biceps, triceps, knees, and ankles. Plantar responses are flexor.  SENSORY: Intact to light touch, pinprick, positional and vibratory sensation are intact in fingers and toes.  COORDINATION: Rapid alternating movements and fine finger movements are intact. There is no dysmetria on finger-to-nose and heel-knee-shin.    GAIT/STANCE: Mildly antalgic gait due to right low back pain Romberg is absent.       DIAGNOSTIC DATA (LABS, IMAGING, TESTING) - I reviewed patient records, labs, notes, testing and imaging myself where available.  Lab Results  Component Value Date   WBC 12.3* 02/21/2016   HGB 13.3 02/21/2016    HCT 40.5 02/21/2016   MCV 90.8 02/21/2016   PLT 287 02/21/2016      Component Value Date/Time   NA 137 02/21/2016 1945   NA 140 06/06/2013 1715   K 3.7 02/21/2016 1945   K 3.5 06/06/2013 1715   CL 105 02/21/2016 1945   CL 111* 06/06/2013 1715   CO2 23 02/21/2016 1945   CO2 25 06/06/2013 1715   GLUCOSE 136* 02/21/2016 1945   GLUCOSE 88 06/06/2013 1715   BUN 9 02/21/2016 1945   BUN 12 06/06/2013 1715   CREATININE 0.80 02/21/2016 1945   CREATININE 1.13 06/06/2013 1715   CREATININE 0.92 04/04/2013 1721   CALCIUM 9.8 02/21/2016 1945   CALCIUM 8.8 06/06/2013 1715   PROT 7.3 02/21/2016 1945   PROT 7.5 06/06/2013 1715   ALBUMIN 4.4 02/21/2016 1945   ALBUMIN 4.2 06/06/2013 1715   AST 24 02/21/2016 1945   AST 24 06/06/2013 1715   ALT 27 02/21/2016 1945   ALT 23 06/06/2013 1715   ALKPHOS 46 02/21/2016 1945   ALKPHOS 47* 06/06/2013 1715   BILITOT 0.3 02/21/2016 1945   BILITOT 0.3 06/06/2013 1715   GFRNONAA >60 02/21/2016 1945   GFRNONAA >60 06/06/2013 1715   GFRAA >60 02/21/2016 1945   GFRAA >60 06/06/2013 1715   Lab Results  Component Value Date   CHOL 136 06/03/2012   HDL 40 06/03/2012   LDLCALC 83 06/03/2012   TRIG 67 06/03/2012   CHOLHDL 3.4 06/03/2012   ASSESSMENT AND PLAN  Michele Estrada is a 45 y.o. female   New onset right low back pain, right hip pain, radiating pain to right lower extremity  MRI of the lumbar spine, CT myelogram fail to demonstrate significant etiology  Differentiation diagnosis remain right lumbar radiculopathy versus right hip pathology  EMG nerve conduction study  X-ray of right hip  Nortriptyline 25 mg titrating to 50 mg every night  History of one isolated seizure in 2004 Migraine headache has much improved last one was February 2016    Levert FeinsteinYijun Idelia Caudell, M.D. Ph.D.  [facility] [address], [address] [place], [place] [address] [phone]

## 2016-04-21 ENCOUNTER — Ambulatory Visit (INDEPENDENT_AMBULATORY_CARE_PROVIDER_SITE_OTHER): Payer: PRIVATE HEALTH INSURANCE | Admitting: Neurology

## 2016-04-21 ENCOUNTER — Ambulatory Visit (INDEPENDENT_AMBULATORY_CARE_PROVIDER_SITE_OTHER): Payer: Self-pay | Admitting: Neurology

## 2016-04-21 DIAGNOSIS — M25551 Pain in right hip: Secondary | ICD-10-CM

## 2016-04-21 DIAGNOSIS — G43109 Migraine with aura, not intractable, without status migrainosus: Secondary | ICD-10-CM

## 2016-04-21 NOTE — Procedures (Signed)
   NCS (NERVE CONDUCTION STUDY) WITH EMG (ELECTROMYOGRAPHY) REPORT   STUDY DATE: August 15th 2017. PATIENT NAME: Michele Estrada DOB: [birth year] MRN: [id number]    TECHNOLOGIST: Gearldine ShownLorraine Jones ELECTROMYOGRAPHER: Levert FeinsteinYan, Mariusz Jubb M.D.  CLINICAL INFORMATION: 45 years old female presenting with few months history of right-sided low back pain radiating pain to right hip.  On examinations, bilateral upper lower extremity motor strength is normal. Deep tendon reflexes were normal and symmetric. Sensory examination was intact to light touch vibratory sensations.  FINDINGS: NERVE CONDUCTION STUDY: Bilateral peroneal sensory responses were normal. Bilateral peroneal to EDB and tibial motor responses were normal. Bilateral tibial H reflexes were normal and symmetric.  NEEDLE ELECTROMYOGRAPHY: Selective needle examinations were performed at right lower extremity muscles right lumbosacral paraspinal muscles.  Needle examination of right tibialis anterior, tibialis posterior, medial gastrocnemius, peroneal longus, vastus lateralis, gluteus medius was normal.  There was no spontaneous activity at right lumbosacral paraspinal muscles, right L4-5 S1.  IMPRESSION:   This is a normal study, there is no electrodiagnostic evidence of right lower extremity neuropathy, or right lumbosacral radiculopathy.   INTERPRETING PHYSICIAN:   Levert FeinsteinYan, Adalbert Alberto M.D. Ph.D. [facility] [address], [address] [place], [place] [address] [phone]

## 2016-06-06 IMAGING — MR MR LUMBAR SPINE W/O CM
4 of 5 series · 25 of 48 positions shown · non-contrast
Comparison: Plain films lumbar spine 05/20/2015.

CLINICAL DATA: Low back pain with a burning sensation for 3-4
weeks. Pain radiates into the right buttock and leg. No known
injury. Initial encounter.

EXAM:
MRI LUMBAR SPINE WITHOUT CONTRAST
TECHNIQUE: Multiplanar, multisequence MR imaging of the lumbar spine was
performed. No intravenous contrast was administered.

[Series 5: T2 · sagittal · 4.0mm · 0.68mm/px · 7 of 15 slices shown (1 of 2)]
[im 1/15]
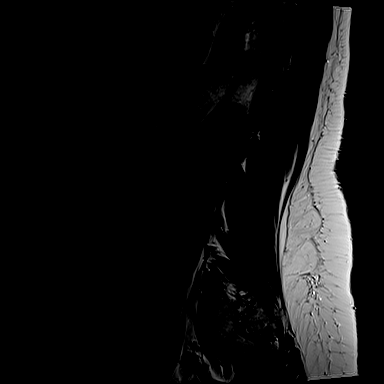
[im 3/15]
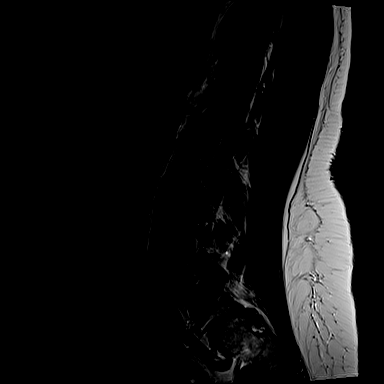
[im 5/15]
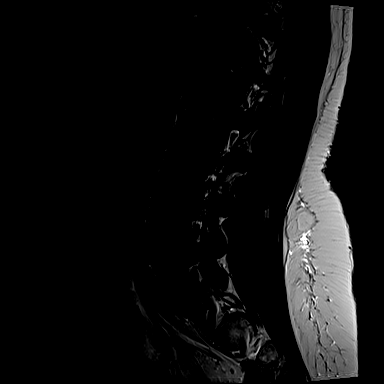
[im 8/15]
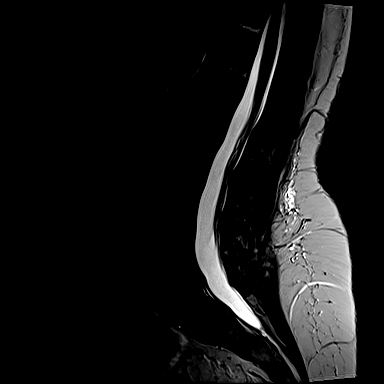
[im 10/15]
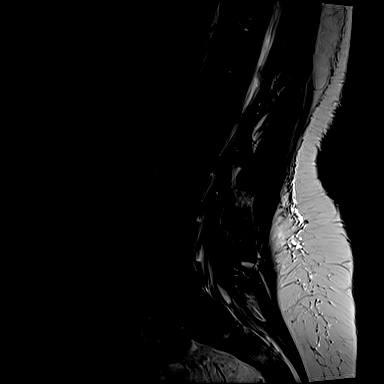
[im 12/15]
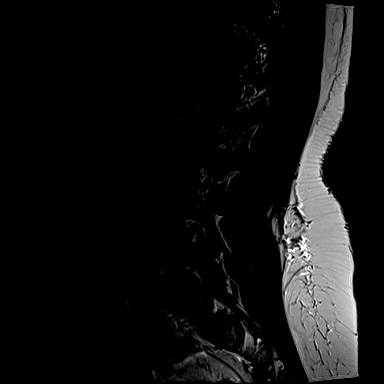
[im 15/15]
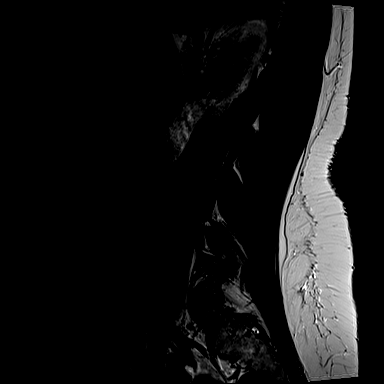

[Series 7: T1 · sagittal · 4.0mm · 0.81mm/px · 6 of 15 slices shown (1 of 2)]
[im 1/15]
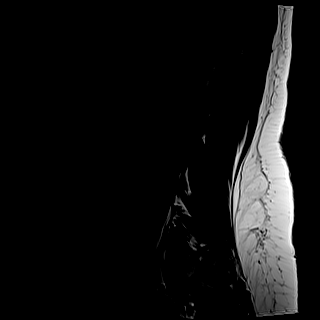
[im 3/15]
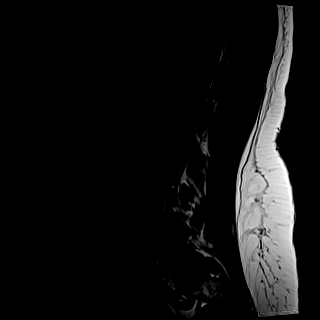
[im 6/15]
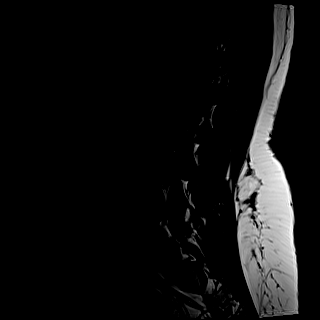
[im 9/15]
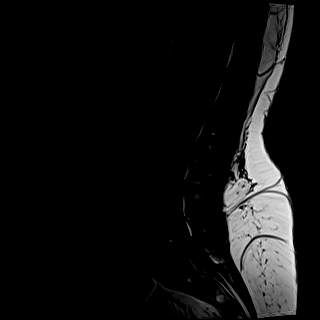
[im 12/15]
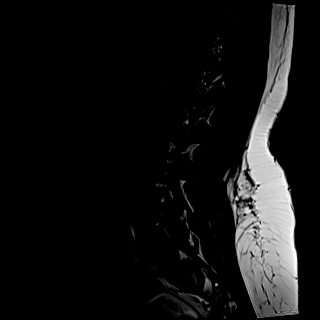
[im 15/15]
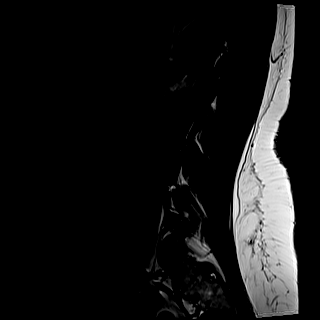

[Series 8: T2 · axial · 4.0mm · 0.28mm/px · z∈[-35,+143]mm · 8 of 33 slices shown (2 of 2)]
[im 1/33]
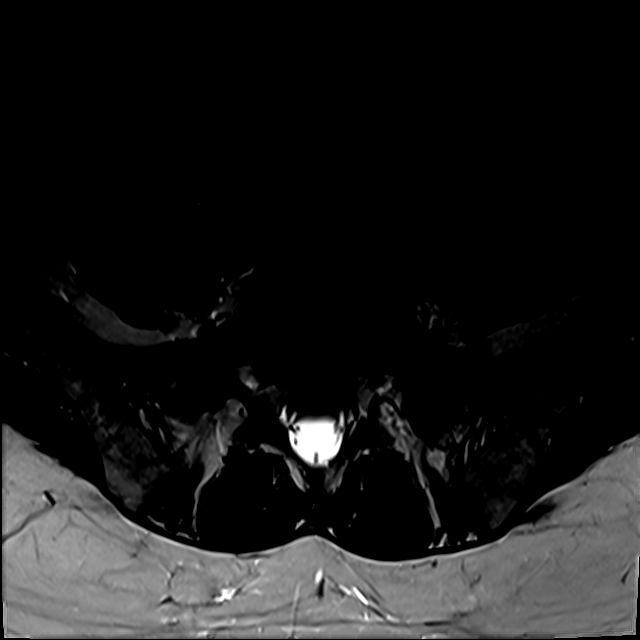
[im 5/33]
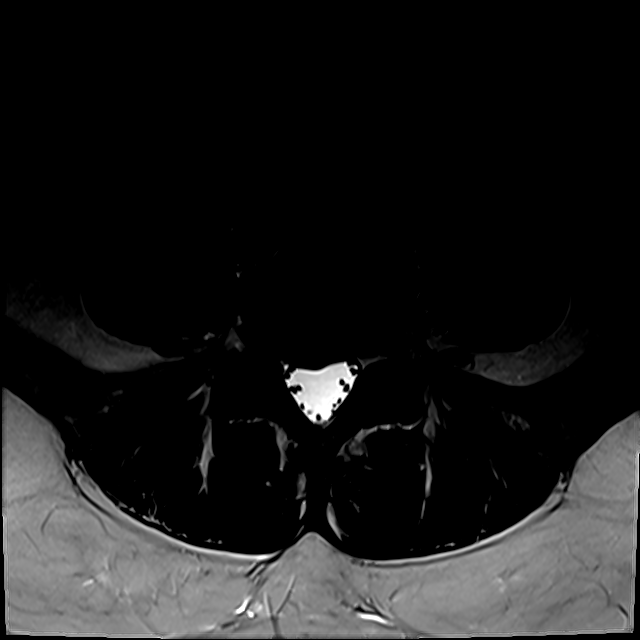
[im 10/33]
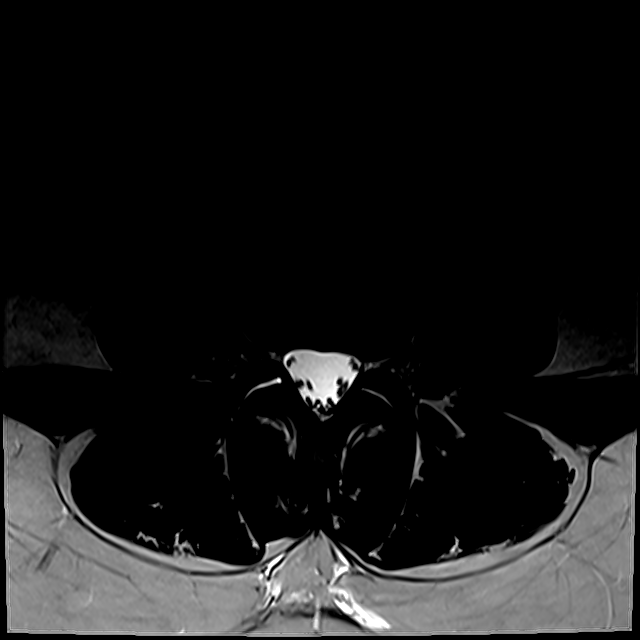
[im 15/33]
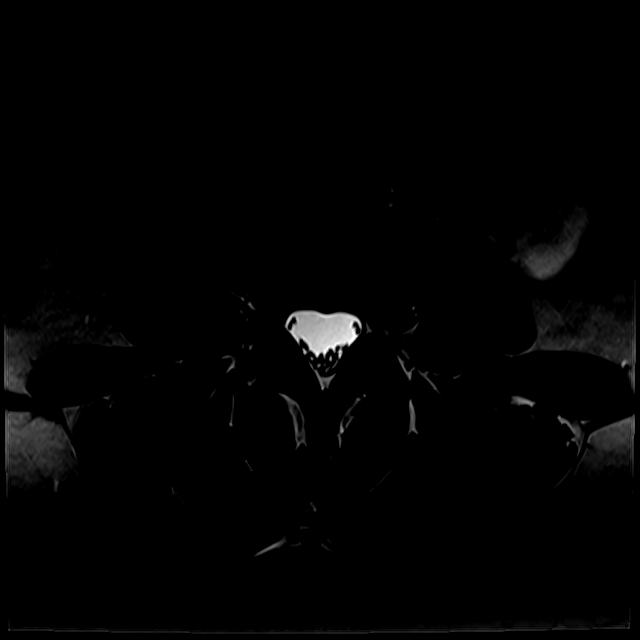
[im 18/33]
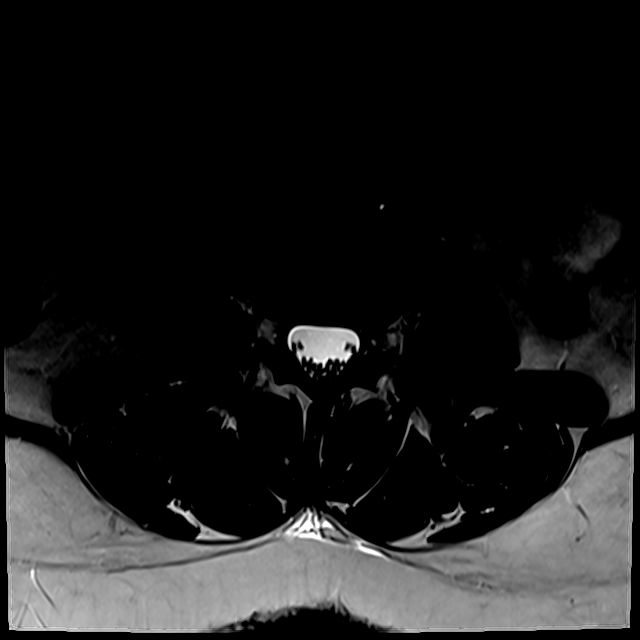
[im 23/33]
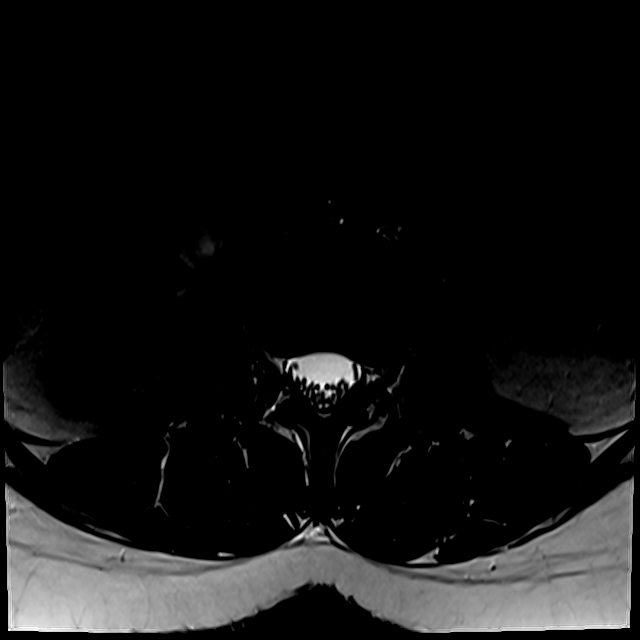
[im 28/33]
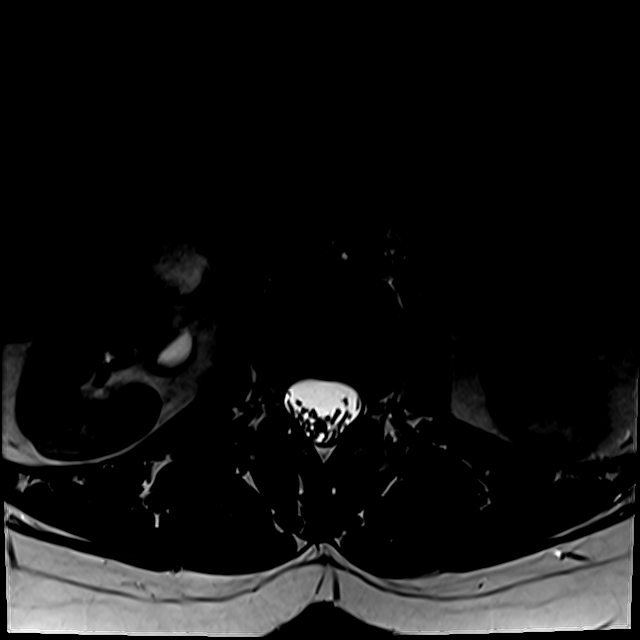
[im 33/33]
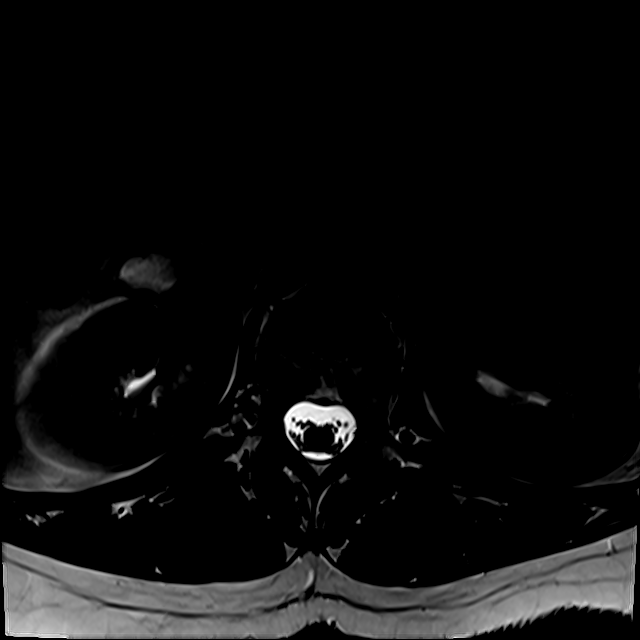

[Series 9: T1 · axial · 4.0mm · 0.59mm/px · z∈[-33,+124]mm · 4 of 34 slices shown (2 of 2)]
[im 1/34]
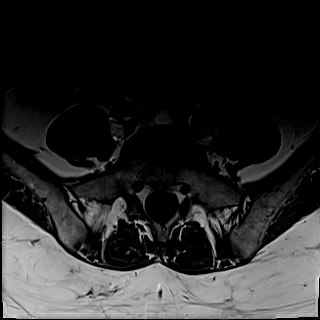
[im 5/34]
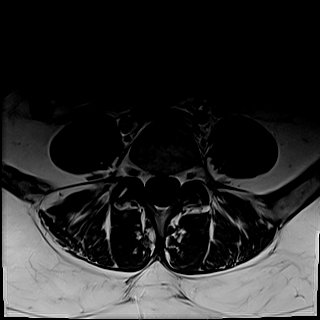
[im 17/34]
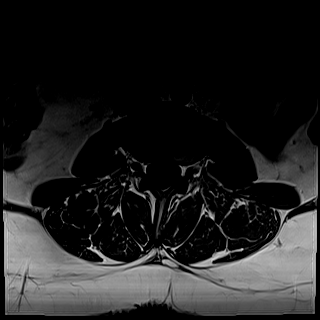
[im 29/34]
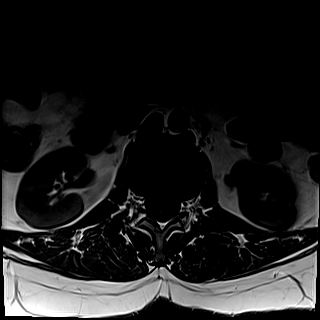

[25 of 48 positions shown; findings below may reference images not displayed]

FINDINGS: Vertebral body height, signal alignment are maintained. The conus
medullaris is normal in signal and position. Imaged intra-abdominal
contents are unremarkable.

The T11-12 to L4-5 levels are negative.

L5-S1: There is a shallow right paracentral protrusion slightly
indenting the ventral thecal sac but the central spinal canal neural
foramina appear widely patent.
IMPRESSION: Shallow right paracentral protrusion at L5-S1 slightly indents the
ventral thecal sac but the central canal and foramina appear widely
patent. The examination is otherwise negative.

## 2016-06-15 ENCOUNTER — Other Ambulatory Visit: Payer: Self-pay | Admitting: Obstetrics and Gynecology

## 2016-06-15 DIAGNOSIS — Z1231 Encounter for screening mammogram for malignant neoplasm of breast: Secondary | ICD-10-CM

## 2016-07-10 ENCOUNTER — Telehealth (INDEPENDENT_AMBULATORY_CARE_PROVIDER_SITE_OTHER): Payer: Self-pay | Admitting: Physical Medicine and Rehabilitation

## 2016-07-10 MED ORDER — TOPIRAMATE 100 MG PO TABS: 100.0000 mg | ORAL_TABLET | Freq: Two times a day (BID) | ORAL | 1 refills | Status: DC

## 2016-07-10 NOTE — Telephone Encounter (Signed)
Called the patient and gave her the instructions. She said she is taking 100 mg at night only because it makes her too sleepy to drive if she takes them in the morning.

## 2016-07-10 NOTE — Telephone Encounter (Signed)
Done, written for 100 mg po bid, please tell her to work up to this

## 2016-07-10 NOTE — Telephone Encounter (Signed)
Increased topamax

## 2016-07-15 ENCOUNTER — Ambulatory Visit
Admission: RE | Admit: 2016-07-15 | Discharge: 2016-07-15 | Disposition: A | Discharge status: Home / Self Care | Payer: No Typology Code available for payment source | Source: Ambulatory Visit | Attending: Obstetrics and Gynecology | Admitting: Obstetrics and Gynecology

## 2016-07-15 DIAGNOSIS — Z1231 Encounter for screening mammogram for malignant neoplasm of breast: Secondary | ICD-10-CM

## 2016-08-04 ENCOUNTER — Encounter (INDEPENDENT_AMBULATORY_CARE_PROVIDER_SITE_OTHER): Payer: Self-pay | Admitting: Orthopaedic Surgery

## 2016-08-04 ENCOUNTER — Ambulatory Visit (INDEPENDENT_AMBULATORY_CARE_PROVIDER_SITE_OTHER): Payer: PRIVATE HEALTH INSURANCE | Admitting: Orthopaedic Surgery

## 2016-08-04 VITALS — BP 114/78 | HR 78 | Ht 61.0 in | Wt 175.0 lb

## 2016-08-04 DIAGNOSIS — M5126 Other intervertebral disc displacement, lumbar region: Secondary | ICD-10-CM | POA: Diagnosis not present

## 2016-08-04 DIAGNOSIS — M5136 Other intervertebral disc degeneration, lumbar region: Secondary | ICD-10-CM
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
## 2016-08-04 NOTE — Patient Instructions (Signed)
Continue the walking program. Continued very slowly and gradually losing some weight. Please [facility] a [facility] so that in the future they can continue to refill your Topamax. Return to see Dr. Ophelia CharterYates in 3 months for recheck.

## 2016-09-09 ENCOUNTER — Encounter: Payer: Self-pay | Admitting: Physician Assistant

## 2016-09-09 ENCOUNTER — Ambulatory Visit (INDEPENDENT_AMBULATORY_CARE_PROVIDER_SITE_OTHER): Payer: PRIVATE HEALTH INSURANCE | Admitting: Physician Assistant

## 2016-09-09 VITALS — BP 138/72 | HR 84 | Temp 98.5°F | Resp 16 | Wt 176.0 lb

## 2016-09-09 DIAGNOSIS — M25551 Pain in right hip: Secondary | ICD-10-CM | POA: Diagnosis not present

## 2016-09-09 DIAGNOSIS — M5136 Other intervertebral disc degeneration, lumbar region: Secondary | ICD-10-CM

## 2016-09-09 DIAGNOSIS — M5126 Other intervertebral disc displacement, lumbar region: Secondary | ICD-10-CM | POA: Diagnosis not present

## 2016-09-09 DIAGNOSIS — Z5181 Encounter for therapeutic drug level monitoring: Secondary | ICD-10-CM

## 2016-09-09 MED ORDER — TOPIRAMATE 100 MG PO TABS: 100.0000 mg | ORAL_TABLET | Freq: Two times a day (BID) | ORAL | 1 refills | Status: DC

## 2016-09-09 NOTE — Patient Instructions (Signed)
Radicular Pain Introduction Radicular pain is a type of pain that spreads from your back or neck along a spinal nerve. Spinal nerves are nerves that leave the spinal cord and go to the muscles. Radicular pain occurs when one of these nerves becomes irritated or squeezed (compressed). Radicular pain is sometimes called radiculopathy, radiculitis, or a pinched nerve. When you have this type of pain, you may also have weakness, numbness, or tingling in the area of your body that is supplied by the nerve. The pain may feel sharp and burning. Spinal nerves leave the spinal cord through openings between the 24 bones (vertebrae) that make up the spine. Radicular pain is often caused by something pushing on a spinal nerve. This pushing may be done by a vertebra or by one of the round cushions between vertebrae (intervertebral disks). This can result from an injury, from wear and tear or aging of a disk, or from the growth of a bone spur that pushes on the nerve. Radicular pain can occur in various areas depending on which spinal nerve is affected:  Cervical radicular pain occurs in the neck. You may also feel pain, numbness, weakness, or tingling in the arms.  Thoracic radicular pain occurs in the mid-spine area. You would feel this pain in the back and chest. This type is rare.  Lumbar radicular pain occurs in the lower back area. You would feel this pain as low back pain. You may feel pain, numbness, weakness, or tingling in the buttocks or legs. Sciatica is a type of lumbar radicular pain that shoots down the back of the leg. Radicular pain often goes away when you follow instructions from your health care provider for relieving pain at home. Follow these instructions at home: Managing pain  If directed, apply ice to the affected area:  Put ice in a plastic bag.  Place a towel between your skin and the bag.  Leave the ice on for 20 minutes, 2-3 times a day.  If directed, apply heat to the affected  area as often as told by your health care provider. Use the heat source that your health care provider recommends, such as a moist heat pack or a heating pad.  Place a towel between your skin and the heat source.  Leave the heat on for 20-30 minutes.  Remove the heat if your skin turns bright red. This is especially important if you are unable to feel pain, heat, or cold. You may have a greater risk of getting burned. Activity  Do not sit or rest in bed for long periods of time.  Try to stay as active as possible. Ask your health care provider what type of exercise or activity is best for you.  Avoid activities that make your pain worse, such as bending and lifting.  Do not lift anything that is heavier than 10 lb (4.5 kg). Practice using proper technique when lifting items. Proper lifting technique involves bending your knees and rising up.  Do strength and range-of-motion exercises only as told by your health care provider. General instructions  Take over-the-counter and prescription medicines only as told by your health care provider.  Pay attention to any changes in your symptoms.  Keep all follow-up visits as told by your health care provider. This is important. Contact a health care provider if:  Your pain and other symptoms get worse.  Your pain medicine is not helping.  Your pain has not improved after a few weeks of home care.  You   have a fever. Get help right away if:  You have severe pain, weakness, or numbness.  You have difficulty with bladder or bowel control. This information is not intended to replace advice given to you by your health care provider. Make sure you discuss any questions you have with your health care provider. Document Released: 10/01/2004 Document Revised: 01/30/2016 Document Reviewed: 03/20/2015  2017 Elsevier  

## 2016-09-09 NOTE — Progress Notes (Signed)
Patient: Michele Estrada Female    DOB: 1970/11/25   46 y.o.   MRN: [id number] Visit Date: 09/09/2016  Today's Provider: Trey Sailors, PA-C   Chief Complaint  Patient presents with  . Back Pain   Subjective:    Back Pain  This is a chronic problem. The current episode started more than 1 year ago. The problem is unchanged. The pain is present in the sacro-iliac and lumbar spine. The quality of the pain is described as burning. The pain radiates to the right thigh. The pain is at a severity of 2/10. Associated symptoms include leg pain and tingling. Pertinent negatives include no abdominal pain, bladder incontinence, bowel incontinence or numbness. She has tried analgesics for the symptoms. The treatment provided significant (Pt reports being stable on Topamax) relief.   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 Ophelia Charter for this in the past. Symptoms described above. Previously got steroid injections which did not work. Has been on Topamax in the past and recently started up again 3 months ago. Denies any adverse effects like change in mood, suicidal thoughts, decreased word finding ability.     Allergies  Allergen Reactions  . Prozac [Fluoxetine Hcl] Other (See Comments)    High dose caused seizures.  . Estrogens Other (See Comments)    DVT on BCPs  . Iron Hives  . Neurontin [Gabapentin] Other (See Comments)    May have made moody and maybe increased migraine frequency.     Current Outpatient Prescriptions:  .  topiramate (TOPAMAX) 100 MG tablet, Take 1 tablet (100 mg total) by mouth 2 (two) times daily., Disp: 60 tablet, Rfl: 1 .  diphenhydrAMINE (BENADRYL) 25 MG tablet, Take 25 mg by mouth every 6 (six) hours as needed for sleep., Disp: , Rfl:   Review of Systems  Constitutional: Negative.   Gastrointestinal: Negative.  Negative for abdominal pain and bowel incontinence.  Genitourinary: Negative  for bladder incontinence.  Musculoskeletal: Positive for back pain and myalgias. Negative for arthralgias, gait problem, joint swelling, neck pain and neck stiffness.  Neurological: Positive for tingling. Negative for numbness.    Social History  Substance Use Topics  . Smoking status: Current Every Day Smoker    Packs/day: 0.80    Years: 17.00    Types: Cigarettes  . Smokeless tobacco: Never Used  . Alcohol use 0.6 oz/week    1 Standard drinks or equivalent per week     Comment: rarely   Objective:   BP 138/72 (BP Location: Right Arm, Patient Position: Sitting, Cuff Size: Large)   Pulse 84   Temp 98.5 F (36.9 C) (Oral)   Resp 16   Wt 176 lb (79.8 kg)   BMI 33.25 kg/m   Physical Exam  Constitutional: She is oriented to person, place, and time.  Cardiovascular: Intact distal pulses.   Musculoskeletal: Normal range of motion. She exhibits no edema, tenderness or deformity.       Right hip: Normal.       Left hip: Normal.       Right knee: Normal.       Left knee: Normal.       Right ankle: Normal.       Left ankle: Normal.  Neurological: She is alert and oriented to person, place, and time. She displays normal reflexes. Coordination normal.  Skin: Skin is warm and dry.  Psychiatric: She  has a normal mood and affect. Her behavior is normal.        Assessment & Plan:     1. Bulging lumbar disc  Controlled with Topamax. Will get renal panel as below to monitor Scr and bicarbonate. Patient may come in annually to monitor this.   - topiramate (TOPAMAX) 100 MG tablet; Take 1 tablet (100 mg total) by mouth 2 (two) times daily.  Dispense: 60 tablet; Refill: 1  2. Right hip pain  See above  3. Medication monitoring encounter  Renal panel to monitor topamax.  - Renal Function Panel  Return in about 1 year (around 09/09/2017) for back pain.  The entirety of the information documented in the History of Present Illness, Review of Systems and Physical Exam were personally  obtained by me. Portions of this information were initially documented by Kavin LeechLaura Layne Dilauro, CMA and reviewed by me for thoroughness and accuracy.   Patient Instructions  Radicular Pain Introduction Radicular pain is a type of pain that spreads from your back or neck along a spinal nerve. Spinal nerves are nerves that leave the spinal cord and go to the muscles. Radicular pain occurs when one of these nerves becomes irritated or squeezed (compressed). Radicular pain is sometimes called radiculopathy, radiculitis, or a pinched nerve. When you have this type of pain, you may also have weakness, numbness, or tingling in the area of your body that is supplied by the nerve. The pain may feel sharp and burning. Spinal nerves leave the spinal cord through openings between the 24 bones (vertebrae) that make up the spine. Radicular pain is often caused by something pushing on a spinal nerve. This pushing may be done by a vertebra or by one of the round cushions between vertebrae (intervertebral disks). This can result from an injury, from wear and tear or aging of a disk, or from the growth of a bone spur that pushes on the nerve. Radicular pain can occur in various areas depending on which spinal nerve is affected:  Cervical radicular pain occurs in the neck. You may also feel pain, numbness, weakness, or tingling in the arms.  Thoracic radicular pain occurs in the mid-spine area. You would feel this pain in the back and chest. This type is rare.  Lumbar radicular pain occurs in the lower back area. You would feel this pain as low back pain. You may feel pain, numbness, weakness, or tingling in the buttocks or legs. Sciatica is a type of lumbar radicular pain that shoots down the back of the leg. Radicular pain often goes away when you follow instructions from your health care provider for relieving pain at home. Follow these instructions at home: Managing pain  If directed, apply ice to the affected area:  Put  ice in a plastic bag.  Place a towel between your skin and the bag.  Leave the ice on for 20 minutes, 2-3 times a day.  If directed, apply heat to the affected area as often as told by your health care provider. Use the heat source that your health care provider recommends, such as a moist heat pack or a heating pad.  Place a towel between your skin and the heat source.  Leave the heat on for 20-30 minutes.  Remove the heat if your skin turns bright red. This is especially important if you are unable to feel pain, heat, or cold. You may have a greater risk of getting burned. Activity  Do not sit or rest in bed  for long periods of time.  Try to stay as active as possible. Ask your health care provider what type of exercise or activity is best for you.  Avoid activities that make your pain worse, such as bending and lifting.  Do not lift anything that is heavier than 10 lb (4.5 kg). Practice using proper technique when lifting items. Proper lifting technique involves bending your knees and rising up.  Do strength and range-of-motion exercises only as told by your health care provider. General instructions  Take over-the-counter and prescription medicines only as told by your health care provider.  Pay attention to any changes in your symptoms.  Keep all follow-up visits as told by your health care provider. This is important. Contact a health care provider if:  Your pain and other symptoms get worse.  Your pain medicine is not helping.  Your pain has not improved after a few weeks of home care.  You have a fever. Get help right away if:  You have severe pain, weakness, or numbness.  You have difficulty with bladder or bowel control. This information is not intended to replace advice given to you by your health care provider. Make sure you discuss any questions you have with your health care provider. Document Released: 10/01/2004 Document Revised: 01/30/2016 Document  Reviewed: 03/20/2015  2017 Elsevier           Ricki Rodriguez Jac Canavan, PA-C  [facility]

## 2016-09-10 ENCOUNTER — Telehealth: Payer: Self-pay

## 2016-09-10 LAB — RENAL FUNCTION PANEL
Albumin: 4.6 g/dL (ref 3.5–5.5)
BUN/Creatinine Ratio: 10 (ref 9–23)
BUN: 9 mg/dL (ref 6–24)
CO2: 18 mmol/L (ref 18–29)
Calcium: 9.1 mg/dL (ref 8.7–10.2)
Chloride: 105 mmol/L (ref 96–106)
Creatinine, Ser: 0.91 mg/dL (ref 0.57–1.00)
GFR calc Af Amer: 89 mL/min/{1.73_m2} (ref >59)
GFR calc non Af Amer: 78 mL/min/{1.73_m2} (ref >59)
Glucose: 84 mg/dL (ref 65–99)
Phosphorus: 3.2 mg/dL (ref 2.5–4.5)
Potassium: 4.2 mmol/L (ref 3.5–5.2)
Sodium: 139 mmol/L (ref 134–144)

## 2016-09-10 NOTE — Telephone Encounter (Signed)
Advised pt of lab results. Pt verbally acknowledges understanding. Emily Drozdowski, CMA   

## 2016-09-10 NOTE — Telephone Encounter (Signed)
-----   Message from Trey SailorsAdriana M Pollak, [place]PA-C sent at 09/10/2016 10:19 AM EST ----- Renal function panel normal.

## 2016-10-23 ENCOUNTER — Emergency Department (HOSPITAL_COMMUNITY)
Admission: EM | Admit: 2016-10-23 | Discharge: 2016-10-23 | Disposition: A | Discharge status: Home / Self Care | Payer: No Typology Code available for payment source | Attending: Emergency Medicine | Admitting: Emergency Medicine

## 2016-10-23 ENCOUNTER — Emergency Department (HOSPITAL_BASED_OUTPATIENT_CLINIC_OR_DEPARTMENT_OTHER)
Admit: 2016-10-23 | Discharge: 2016-10-23 | Disposition: A | Discharge status: Home / Self Care | Payer: No Typology Code available for payment source | Attending: Emergency Medicine | Admitting: Emergency Medicine

## 2016-10-23 ENCOUNTER — Encounter (HOSPITAL_COMMUNITY): Payer: Self-pay | Admitting: Emergency Medicine

## 2016-10-23 DIAGNOSIS — F17210 Nicotine dependence, cigarettes, uncomplicated: Secondary | ICD-10-CM | POA: Insufficient documentation

## 2016-10-23 DIAGNOSIS — M79609 Pain in unspecified limb: Secondary | ICD-10-CM

## 2016-10-23 DIAGNOSIS — M79661 Pain in right lower leg: Secondary | ICD-10-CM | POA: Diagnosis not present

## 2016-10-23 DIAGNOSIS — Z79899 Other long term (current) drug therapy: Secondary | ICD-10-CM | POA: Insufficient documentation

## 2016-10-23 LAB — I-STAT CHEM 8, ED
BUN: 13 mg/dL (ref 6–20)
CALCIUM ION: 1.27 mmol/L (ref 1.15–1.40)
Chloride: 112 mmol/L — ABNORMAL HIGH (ref 101–111)
Creatinine, Ser: 0.8 mg/dL (ref 0.44–1.00)
Glucose, Bld: 88 mg/dL (ref 65–99)
HEMATOCRIT: 38 % (ref 36.0–46.0)
HEMOGLOBIN: 12.9 g/dL (ref 12.0–15.0)
Potassium: 3.5 mmol/L (ref 3.5–5.1)
SODIUM: 143 mmol/L (ref 135–145)
TCO2: 20 mmol/L (ref 0–100)
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
## 2016-10-23 NOTE — Progress Notes (Signed)
*  PRELIMINARY RESULTS* Vascular Ultrasound Right lower extremity venous duplex has been completed.  Preliminary findings: No evidence of deep vein thrombosis or baker's cyst in the right lower extremity.   Chauncey FischerCharlotte C Toron Bowring 10/23/2016, 10:50 AM

## 2016-10-23 NOTE — ED Notes (Signed)
Papers reviewed with patient and she verbalizes understanding and intent to follow up. 

## 2016-10-23 NOTE — ED Triage Notes (Signed)
Pt sts right leg pain in calf area starting this am; pt sts hx of rsd and sts hx of DVT

## 2016-11-03 ENCOUNTER — Encounter (INDEPENDENT_AMBULATORY_CARE_PROVIDER_SITE_OTHER): Payer: Self-pay | Admitting: Orthopaedic Surgery

## 2016-11-03 ENCOUNTER — Ambulatory Visit (INDEPENDENT_AMBULATORY_CARE_PROVIDER_SITE_OTHER): Payer: PRIVATE HEALTH INSURANCE | Admitting: Orthopaedic Surgery

## 2016-11-03 VITALS — BP 116/77 | HR 76 | Temp 97.7°F | Ht 61.0 in | Wt 165.0 lb

## 2016-11-03 DIAGNOSIS — M5136 Other intervertebral disc degeneration, lumbar region: Secondary | ICD-10-CM

## 2016-11-03 DIAGNOSIS — M5126 Other intervertebral disc displacement, lumbar region: Secondary | ICD-10-CM

## 2016-11-03 NOTE — Addendum Note (Signed)
Addended by: Rogers SeedsYEATTS, Izak Anding M on: 11/03/2016 10:16 AM   Modules accepted: Kipp BroodSmartSet
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
## 2016-11-04 ENCOUNTER — Other Ambulatory Visit: Payer: Self-pay | Admitting: Physician Assistant

## 2016-11-04 DIAGNOSIS — M5126 Other intervertebral disc displacement, lumbar region: Secondary | ICD-10-CM

## 2016-11-04 DIAGNOSIS — M5136 Other intervertebral disc degeneration, lumbar region: Secondary | ICD-10-CM

## 2016-12-04 ENCOUNTER — Other Ambulatory Visit: Payer: Self-pay | Admitting: Physician Assistant

## 2016-12-04 DIAGNOSIS — M5136 Other intervertebral disc degeneration, lumbar region: Secondary | ICD-10-CM

## 2016-12-04 DIAGNOSIS — M5126 Other intervertebral disc displacement, lumbar region: Secondary | ICD-10-CM

## 2016-12-30 ENCOUNTER — Encounter (INDEPENDENT_AMBULATORY_CARE_PROVIDER_SITE_OTHER): Payer: Self-pay | Admitting: Orthopaedic Surgery

## 2016-12-30 ENCOUNTER — Ambulatory Visit (INDEPENDENT_AMBULATORY_CARE_PROVIDER_SITE_OTHER): Payer: PRIVATE HEALTH INSURANCE | Admitting: Orthopaedic Surgery

## 2016-12-30 VITALS — BP 125/79 | HR 79 | Ht 61.0 in | Wt 165.0 lb

## 2016-12-30 DIAGNOSIS — M5126 Other intervertebral disc displacement, lumbar region: Secondary | ICD-10-CM | POA: Diagnosis not present

## 2016-12-30 DIAGNOSIS — M5136 Other intervertebral disc degeneration, lumbar region: Secondary | ICD-10-CM
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
## 2017-01-26 ENCOUNTER — Ambulatory Visit (INDEPENDENT_AMBULATORY_CARE_PROVIDER_SITE_OTHER): Payer: PRIVATE HEALTH INSURANCE | Admitting: Family Medicine

## 2017-01-26 ENCOUNTER — Encounter: Payer: Self-pay | Admitting: Family Medicine

## 2017-01-26 VITALS — BP 112/78 | HR 87 | Temp 98.8°F | Resp 16 | Wt 170.6 lb

## 2017-01-26 DIAGNOSIS — R040 Epistaxis: Secondary | ICD-10-CM

## 2017-01-26 NOTE — Progress Notes (Signed)
Subjective:     Patient ID: Michele Estrada, female   DOB: [birth year], 46 y.o.   MRN: [id number]  HPI  Chief Complaint  Patient presents with  . Epistaxis    Patient comes in office todfay with concerns of frequent nose bleeds since 01/22/17. Patient states upoon awakening on 5/18 she had difficulty breathing and states that her nose bled from the left nostril for an hour. Patient reports that on 5/18 her nose continually bled throughout the day in 30min increments. Patiet states that she packs her nose and places a cold wash cloth on neck to stop bleeding. Patient reports that she had two more episodes since on 5/19 and today.   States current bleeding is from the left nostril. Reports she has had trouble with nose bleeds since being a kid and requiring plastic surgery on her nose.   Review of Systems     Objective:   Physical Exam  Constitutional: She appears well-developed and well-nourished. No distress.  HENT:  Nostrils with no active bleeding sites. No polyp noted.       Assessment:    1. Left-sided epistaxis     Plan:    Discussed use of pressure to stop bleeding; nasal decongestant spray after a bleed, and saline spray to moisturize. ENT referral if recurrent.

## 2017-01-26 NOTE — Patient Instructions (Signed)
Use pressure over the bridge of your nose when bleeding starts. Once bleeding stops use a nasal decongestant spray once. Consider using a salt water (saline) nasal spray to keep your nasal passages moist and to clear out any irritants.-

## 2017-02-26 ENCOUNTER — Encounter: Payer: Self-pay | Admitting: Physician Assistant

## 2017-02-26 ENCOUNTER — Ambulatory Visit (INDEPENDENT_AMBULATORY_CARE_PROVIDER_SITE_OTHER): Payer: PRIVATE HEALTH INSURANCE | Admitting: Physician Assistant

## 2017-02-26 VITALS — BP 118/72 | HR 88 | Temp 98.2°F | Resp 16 | Wt 170.0 lb

## 2017-02-26 DIAGNOSIS — M5126 Other intervertebral disc displacement, lumbar region: Secondary | ICD-10-CM | POA: Diagnosis not present
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
## 2017-02-26 NOTE — Patient Instructions (Signed)
Herniated Disk A herniated disk is when a disk in your spine bulges out too far. There is a disk with a spongy center in between each pair of bones in the spine (vertebrae). These disks act as shock absorbers when you move. A herniated disk can cause pain and muscle weakness. This can happen anywhere in the back or neck. Follow these instructions at home: Medicines  Take over-the-counter and prescription medicines only as told by your doctor.  Do not drive or use heavy machinery while taking prescription pain medicine. Activity  Rest as told by your doctor.  After your rest period: ? Return to your normal activities. Slowly start exercising as told by your doctor. Ask what activities are safe for you. ? Use good posture. ? Avoid movements that cause pain. ? Do not lift anything that is heavier than 10 lb (4.5 kg) until your doctor says this is safe. ? Do not sit or stand for a long time without moving. ? Do not sit for a long time without getting up and moving around.  Do exercises (physical therapy) as told.  Try to strengthen your back and belly (abdomen) with exercises like crunches, swimming, or walking. General instructions  Do not use any products that contain nicotine or tobacco, such as cigarettes and e-cigarettes. If you need help quitting, ask your doctor.  Do not wear high-heeled shoes.  Do not sleep on your belly.  If you are overweight, work with your doctor to lose weight safely.  To prevent or treat constipation while you are taking prescription pain medicine, your doctor may recommend that you: ? Drink enough fluid to keep your pee (urine) clear or pale yellow. ? Take over-the-counter or prescription medicines. ? Eat foods that are high in fiber. These include fresh fruits and vegetables, whole grains, and beans. ? Limit foods that are high in fat and processed sugars. These include fried and sweet foods.  Keep all follow-up visits as told by your doctor. This  is important. How is this prevented?  Stay at a healthy weight.  Try to avoid stress.  Stay in shape. Do at least 150 minutes of moderate-intensity exercise each week, such as fast walking or water aerobics.  When lifting objects: ? Keep your feet as far apart as your shoulders (shoulder-width apart) or farther apart. ? Tighten your belly muscles. ? Bend your knees and hips and keep your spine neutral. Lift using the strength of your legs, not your back. Do not lock your knees straight out. ? Always ask for help to lift heavy or awkward objects. Contact a doctor if:  You have back pain or neck pain that does not get better after 6 weeks.  You have very bad pain.  You get any of these problems in any part of your body: ? Tingling. ? Weakness. ? Loss of feeling (numbness). Get help right away if:  You cannot move your arms or legs.  You cannot control when you pee (urinate) or poop (have a bowel movement).  You feel dizzy.  You faint.  You have trouble breathing. This information is not intended to replace advice given to you by your health care provider. Make sure you discuss any questions you have with your health care provider. Document Released: 01/08/2014 Document Revised: 04/22/2016 Document Reviewed: 02/20/2016 Elsevier Interactive Patient Education  2017 Elsevier Inc.  

## 2017-03-02 ENCOUNTER — Telehealth: Payer: Self-pay | Admitting: Physician Assistant

## 2017-03-02 NOTE — Telephone Encounter (Signed)
Spoke with patient about her referral to Dr. Yves Dillhasnis being declined and pain mgmt clinic being his suggestion. Patient has done pain management before and doesn't want to do this again. She doesn't want more injections. Gave patient option of whether she knew of another provider she wanted to go to, whether it be orthopedics or pain mgmt. We can refer elsewhere, it's at her discretion. She will discuss with her husband and call back with decision.

## 2017-03-02 NOTE — Telephone Encounter (Signed)
Please review

## 2017-03-02 NOTE — Telephone Encounter (Signed)
Called patient. Thank you.

## 2017-03-02 NOTE — Telephone Encounter (Signed)
FYI--Dr Chasnis declined referral suggesting referral to pain clinic

## 2017-03-22 ENCOUNTER — Emergency Department
Admission: EM | Admit: 2017-03-22 | Discharge: 2017-03-22 | Disposition: A | Discharge status: Home / Self Care | Payer: No Typology Code available for payment source | Attending: Emergency Medicine | Admitting: Emergency Medicine

## 2017-03-22 ENCOUNTER — Encounter: Payer: Self-pay | Admitting: Emergency Medicine

## 2017-03-22 DIAGNOSIS — Z79899 Other long term (current) drug therapy: Secondary | ICD-10-CM | POA: Diagnosis not present

## 2017-03-22 DIAGNOSIS — F17210 Nicotine dependence, cigarettes, uncomplicated: Secondary | ICD-10-CM | POA: Diagnosis not present

## 2017-03-22 DIAGNOSIS — R040 Epistaxis: Secondary | ICD-10-CM | POA: Insufficient documentation

## 2017-03-22 LAB — CBC WITH DIFFERENTIAL/PLATELET
BASOS ABS: 0.1 10*3/uL (ref 0–0.1)
Basophils Relative: 1 %
EOS PCT: 1 %
Eosinophils Absolute: 0 10*3/uL (ref 0–0.7)
HCT: 38 % (ref 35.0–47.0)
Hemoglobin: 13.3 g/dL (ref 12.0–16.0)
LYMPHS PCT: 26 %
Lymphs Abs: 1.7 10*3/uL (ref 1.0–3.6)
MCH: 31.5 pg (ref 26.0–34.0)
MCHC: 35 g/dL (ref 32.0–36.0)
MCV: 90.1 fL (ref 80.0–100.0)
MONO ABS: 0.4 10*3/uL (ref 0.2–0.9)
MONOS PCT: 7 %
Neutro Abs: 4.3 10*3/uL (ref 1.4–6.5)
Neutrophils Relative %: 65 %
PLATELETS: 245 10*3/uL (ref 150–440)
RBC: 4.21 MIL/uL (ref 3.80–5.20)
RDW: 12.5 % (ref 11.5–14.5)
WBC: 6.6 10*3/uL (ref 3.6–11.0)

## 2017-03-22 LAB — PROTIME-INR
INR: 1
Prothrombin Time: 13.2 seconds (ref 11.4–15.2)
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
## 2017-03-22 NOTE — Discharge Instructions (Signed)
Follow-up with ENT clinic by calling for an appointment.

## 2017-03-22 NOTE — ED Triage Notes (Signed)
Pt reports nosebleed this morning. Pt seen by PCP and sent here after treatment because it took 45 minutes for the bleeding to stop. Pt reports frequent nosebleeds. Denies using anticoagulants. No bleeding noted in triage.

## 2017-03-22 NOTE — ED Notes (Signed)
See triage note sent in by PCP for nosebleed  No bleeding noted at present   Denies any trauma

## 2017-05-17 DIAGNOSIS — G894 Chronic pain syndrome: Secondary | ICD-10-CM | POA: Insufficient documentation

## 2017-05-17 DIAGNOSIS — G9050 Complex regional pain syndrome I, unspecified: Secondary | ICD-10-CM | POA: Insufficient documentation

## 2017-06-10 ENCOUNTER — Other Ambulatory Visit: Payer: Self-pay | Admitting: Physician Assistant

## 2017-06-10 DIAGNOSIS — M5136 Other intervertebral disc degeneration, lumbar region: Secondary | ICD-10-CM

## 2017-06-10 DIAGNOSIS — M5126 Other intervertebral disc displacement, lumbar region: Secondary | ICD-10-CM

## 2017-06-15 ENCOUNTER — Other Ambulatory Visit: Payer: Self-pay | Admitting: Obstetrics and Gynecology

## 2017-06-15 DIAGNOSIS — Z1231 Encounter for screening mammogram for malignant neoplasm of breast: Secondary | ICD-10-CM

## 2017-07-16 ENCOUNTER — Ambulatory Visit
Admission: RE | Admit: 2017-07-16 | Discharge: 2017-07-16 | Disposition: A | Discharge status: Home / Self Care | Payer: No Typology Code available for payment source | Source: Ambulatory Visit | Attending: Obstetrics and Gynecology | Admitting: Obstetrics and Gynecology

## 2017-07-16 DIAGNOSIS — Z1231 Encounter for screening mammogram for malignant neoplasm of breast: Secondary | ICD-10-CM

## 2017-08-09 ENCOUNTER — Other Ambulatory Visit: Payer: Self-pay | Admitting: Orthopaedic Surgery

## 2017-08-09 DIAGNOSIS — M5416 Radiculopathy, lumbar region: Secondary | ICD-10-CM

## 2017-08-23 ENCOUNTER — Ambulatory Visit
Admission: RE | Admit: 2017-08-23 | Discharge: 2017-08-23 | Disposition: A | Discharge status: Home / Self Care | Payer: No Typology Code available for payment source | Source: Ambulatory Visit | Attending: Orthopaedic Surgery | Admitting: Orthopaedic Surgery

## 2017-08-23 DIAGNOSIS — M5416 Radiculopathy, lumbar region: Secondary | ICD-10-CM

## 2017-09-07 HISTORY — PX: OTHER SURGICAL HISTORY: SHX169

## 2017-09-13 ENCOUNTER — Other Ambulatory Visit: Payer: Self-pay | Admitting: Physician Assistant

## 2017-09-13 DIAGNOSIS — M5126 Other intervertebral disc displacement, lumbar region: Secondary | ICD-10-CM

## 2017-09-13 DIAGNOSIS — M5136 Other intervertebral disc degeneration, lumbar region: Secondary | ICD-10-CM

## 2017-09-24 DIAGNOSIS — G8929 Other chronic pain: Secondary | ICD-10-CM | POA: Insufficient documentation

## 2017-11-05 DIAGNOSIS — G90521 Complex regional pain syndrome I of right lower limb: Secondary | ICD-10-CM | POA: Insufficient documentation

## 2018-01-10 ENCOUNTER — Encounter: Payer: Self-pay | Admitting: Physician Assistant

## 2018-01-10 ENCOUNTER — Ambulatory Visit (INDEPENDENT_AMBULATORY_CARE_PROVIDER_SITE_OTHER): Payer: PRIVATE HEALTH INSURANCE | Admitting: Physician Assistant

## 2018-01-10 VITALS — BP 124/72 | HR 84 | Temp 98.1°F | Resp 16 | Wt 163.0 lb

## 2018-01-10 DIAGNOSIS — F329 Major depressive disorder, single episode, unspecified: Secondary | ICD-10-CM | POA: Diagnosis not present

## 2018-01-10 DIAGNOSIS — Z9689 Presence of other specified functional implants: Secondary | ICD-10-CM | POA: Diagnosis not present

## 2018-01-10 DIAGNOSIS — M79604 Pain in right leg: Secondary | ICD-10-CM

## 2018-01-10 NOTE — Patient Instructions (Signed)
Spinal Cord Stimulation Trial Information A spinal cord stimulation trial is a test to see whether a spinal cord stimulator reduces your pain. A spinal cord stimulator is a small device that is attached to your back or inserted (implanted) in your back. The stimulator has small wires (leads) that connect it to your spinal cord. The stimulator sends electrical pulses through the leads to the spinal cord. This can relieve pain. Your health care provider may suggest a spinal cord stimulation trial if other treatments for chronic pain have not worked for you. Spinal cord stimulation may be used to manage pain that is caused by:  Coronary artery disease.  Failed back surgery.  Phantom limb sensation.  Peripheral neuropathy.  Complex regional pain syndrome.  Other syndromes that involve long-lasting (chronic) pain.  A trial period is usually 3-5 days, but this can vary among health care providers. After your trial period, you and your health care provider will discuss whether a permanent spinal cord stimulator is an option for you. The permanent stimulator may be an option depending on:  Whether the stimulator reduces your pain during the trial.  Whether the stimulator fits into your lifestyle.  Whether the cost of the stimulator is covered by your insurance.  How is a spinal cord stimulator placed for a trial? For a trial period, the stimulator is placed on your skin, not under it. Only the leads that connect the stimulator to the spinal cord are implanted under your skin. The exact location of the stimulator depends on where you have pain. There are two types of surgery for implanting the leads:  Noninvasive surgery. In this type of surgery, a small incision is made and needles are used to place the leads under your skin.  Open surgery. In this type of surgery, a larger incision is made, and the leads are implanted directly into your back.  How should I care for myself after a spinal cord  stimulator is placed? Activity  Return to your normal activities as told by your health care provider. Ask your health care provider what activities are safe for you.  Do not lift anything that is heavier than 10 lb (4.5 kg). General Instructions   Follow your health care provider's specific instructions about how to take care of your spinal cord stimulator and your incision.  Make sure to write down the following information so that you can share this information with your health care provider: ? Your responses to the stimulator, as told by your health care provider. ? Your pain level throughout the day. ? The amount and kind of pain medicine that you take.  Take over-the-counter and prescription medicines only as told by your health care provider.  Do not take baths, swim, or use a hot tub until your health care provider approves.  Tell all health care providers who care for you that you have a spinal cord stimulator. This is important information that could affect the medical treatment that you receive.  Keep all follow-up visits as told by your health care provider. This is important. When should I seek medical care? Seek medical care if:  You have more redness, swelling, or pain around your incision.  You have more fluid or blood coming from your incision.  Your incision feels warm to the touch.  You have pus or a bad smell coming from your incision.  The bandage (dressing) that covers your incision comes off.  When should I seek immediate medical care? Seek immediate medical   care if:  The stimulator leads come out.  Your pain gets worse.  You develop numbness or weakness in your legs, or you have difficulty walking.  You have problems urinating or having a bowel movement.  You have a fever.  You have symptoms that last for more than 2-3 days.  Your symptoms suddenly get worse.  This information is not intended to replace advice given to you by your health  care provider. Make sure you discuss any questions you have with your health care provider. Document Released: 12/09/2010 Document Revised: 04/21/2016 Document Reviewed: 03/14/2015 Elsevier Interactive Patient Education  2018 Elsevier Inc.  
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
## 2018-03-15 DIAGNOSIS — D6861 Antiphospholipid syndrome: Secondary | ICD-10-CM | POA: Insufficient documentation

## 2018-03-15 DIAGNOSIS — M5136 Other intervertebral disc degeneration, lumbar region: Secondary | ICD-10-CM | POA: Insufficient documentation

## 2018-03-15 DIAGNOSIS — M51369 Other intervertebral disc degeneration, lumbar region without mention of lumbar back pain or lower extremity pain: Secondary | ICD-10-CM | POA: Insufficient documentation

## 2018-03-15 DIAGNOSIS — R569 Unspecified convulsions: Secondary | ICD-10-CM | POA: Insufficient documentation

## 2018-03-17 DIAGNOSIS — M792 Neuralgia and neuritis, unspecified: Secondary | ICD-10-CM | POA: Insufficient documentation

## 2018-03-17 DIAGNOSIS — M79601 Pain in right arm: Secondary | ICD-10-CM | POA: Insufficient documentation

## 2018-03-17 DIAGNOSIS — G8929 Other chronic pain: Secondary | ICD-10-CM | POA: Insufficient documentation

## 2018-04-29 ENCOUNTER — Ambulatory Visit (INDEPENDENT_AMBULATORY_CARE_PROVIDER_SITE_OTHER): Payer: PRIVATE HEALTH INSURANCE | Admitting: Physician Assistant

## 2018-04-29 ENCOUNTER — Encounter: Payer: Self-pay | Admitting: Physician Assistant

## 2018-04-29 VITALS — BP 124/70 | HR 70 | Temp 98.2°F | Resp 16 | Wt 164.0 lb

## 2018-04-29 DIAGNOSIS — F329 Major depressive disorder, single episode, unspecified: Secondary | ICD-10-CM | POA: Diagnosis not present

## 2018-04-29 DIAGNOSIS — F32A Depression, unspecified: Secondary | ICD-10-CM

## 2018-04-29 NOTE — Progress Notes (Signed)
Patient: Michele Estrada Female    DOB: 17-Nov-1970   47 y.o.   MRN: [id number] Visit Date: 04/29/2018  Today's Provider: Trey Sailors, PA-C   Chief Complaint  Patient presents with  . Follow-up   Subjective:    HPI   Patient comes in today for a follow up. She reports that she was supposed to get a referral for therapy and never heard anything. She reports that her symptoms are worsening and would like to see someone soon. Has a history of complex regional pain syndrome and has seen multiple providers for this over the years. Currently seeing pain management services at [facility]. Reports she needs a letter from her provider about her disease process and why she cant work in order to help disability case. She says she has asked her CRPS specialist and they have directed her here. She continues to decline additional medication for anxiety and depression. She is on 30 mg Cymbalta with plans to increase it to 60 mg daily. She has a dog that is training to be a Metallurgist. She has been denied from disability twice with instructions to find an adaptable job.     Allergies  Allergen Reactions  . Prozac [Fluoxetine Hcl] Other (See Comments)    High dose caused seizures.  . Estrogens Other (See Comments)    DVT on BCPs  . Iron Hives  . Neurontin [Gabapentin] Other (See Comments)    May have made moody and maybe increased migraine frequency.     Current Outpatient Medications:  .  diphenhydrAMINE (BENADRYL) 25 MG tablet, Take 25 mg by mouth every 6 (six) hours as needed for sleep., Disp: , Rfl:  .  tiZANidine (ZANAFLEX) 4 MG tablet, TAKE 1 TABLET (4 MG TOTAL) BY MOUTH EVERY 6 (SIX) HOURS AS NEEDED FOR UP TO 30 DAYS., Disp: , Rfl: 2 .  topiramate (TOPAMAX) 100 MG tablet, TAKE 1 TABLET BY MOUTH TWICE A DAY, Disp: 60 tablet, Rfl: 2  Review of Systems  Constitutional: Positive for activity change and fatigue.  Respiratory: Negative for cough and shortness of breath.     Cardiovascular: Positive for leg swelling. Negative for chest pain and palpitations.  Musculoskeletal: Positive for arthralgias, back pain, gait problem, joint swelling and myalgias.  Neurological: Positive for dizziness. Negative for light-headedness and headaches.  Psychiatric/Behavioral: Negative for agitation, decreased concentration, self-injury, sleep disturbance and suicidal ideas. The patient is nervous/anxious.     Social History   Tobacco Use  . Smoking status: Current Every Day Smoker    Packs/day: 0.80    Years: 17.00    Pack years: 13.60    Types: Cigarettes  . Smokeless tobacco: Never Used  Substance Use Topics  . Alcohol use: Yes    Alcohol/week: 1.0 standard drinks    Types: 1 Standard drinks or equivalent per week    Comment: rarely   Objective:   BP 124/70 (BP Location: Right Arm, Patient Position: Sitting, Cuff Size: Normal)   Pulse 70   Temp 98.2 F (36.8 C)   Resp 16   Wt 164 lb (74.4 kg)   SpO2 98%   BMI 30.99 kg/m  Vitals:   04/29/18 1155  BP: 124/70  Pulse: 70  Resp: 16  Temp: 98.2 F (36.8 C)  SpO2: 98%  Weight: 164 lb (74.4 kg)     Physical Exam  Constitutional: She is oriented to person, place, and time. She appears well-developed and well-nourished.  Neurological: She  is alert and oriented to person, place, and time.  Skin: Skin is warm and dry.  Psychiatric: Her mood appears anxious. She exhibits a depressed mood.  Tearful in exam room.         Assessment & Plan:     1. Depression, unspecified depression type  Apologized for lack of contact from C3 team. I have sent this chart to C3 social worker directly and she should be contacted about counseling resources and also financial resources for medication.   - Ambulatory referral to [facility]  Return if symptoms worsen or fail to improve.  The entirety of the information documented in the History of Present Illness, Review of Systems and Physical Exam were personally  obtained by me. Portions of this information were initially documented by Anson Oregonachelle Presley, CMA and reviewed by me for thoroughness and accuracy.           Trey SailorsAdriana M Ezell Poke, PA-C  [facility]

## 2018-05-04 ENCOUNTER — Encounter (HOSPITAL_COMMUNITY): Payer: Self-pay

## 2018-05-04 ENCOUNTER — Encounter (HOSPITAL_COMMUNITY): Payer: Self-pay | Admitting: Licensed Clinical Social Worker

## 2018-05-04 ENCOUNTER — Ambulatory Visit (HOSPITAL_COMMUNITY): Payer: No Typology Code available for payment source | Admitting: Licensed Clinical Social Worker

## 2018-05-04 DIAGNOSIS — F332 Major depressive disorder, recurrent severe without psychotic features: Secondary | ICD-10-CM | POA: Insufficient documentation

## 2018-05-19 ENCOUNTER — Emergency Department (HOSPITAL_COMMUNITY)
Admission: EM | Admit: 2018-05-19 | Discharge: 2018-05-20 | Disposition: A | Discharge status: Home / Self Care | Payer: No Typology Code available for payment source | Attending: Emergency Medicine | Admitting: Emergency Medicine

## 2018-05-19 ENCOUNTER — Encounter (HOSPITAL_COMMUNITY): Payer: Self-pay

## 2018-05-19 ENCOUNTER — Other Ambulatory Visit: Payer: Self-pay

## 2018-05-19 DIAGNOSIS — Z79899 Other long term (current) drug therapy: Secondary | ICD-10-CM | POA: Diagnosis not present

## 2018-05-19 DIAGNOSIS — G894 Chronic pain syndrome: Secondary | ICD-10-CM

## 2018-05-19 DIAGNOSIS — R197 Diarrhea, unspecified: Secondary | ICD-10-CM | POA: Insufficient documentation

## 2018-05-19 DIAGNOSIS — R112 Nausea with vomiting, unspecified: Secondary | ICD-10-CM

## 2018-05-19 DIAGNOSIS — R1031 Right lower quadrant pain: Secondary | ICD-10-CM | POA: Diagnosis not present

## 2018-05-19 DIAGNOSIS — F17210 Nicotine dependence, cigarettes, uncomplicated: Secondary | ICD-10-CM | POA: Diagnosis not present

## 2018-05-19 LAB — COMPREHENSIVE METABOLIC PANEL
ALK PHOS: 36 U/L — AB (ref 38–126)
ALT: 24 U/L (ref 0–44)
AST: 21 U/L (ref 15–41)
Albumin: 4.1 g/dL (ref 3.5–5.0)
Anion gap: 10 (ref 5–15)
BILIRUBIN TOTAL: 0.8 mg/dL (ref 0.3–1.2)
BUN: 19 mg/dL (ref 6–20)
CHLORIDE: 111 mmol/L (ref 98–111)
CO2: 19 mmol/L — ABNORMAL LOW (ref 22–32)
CREATININE: 0.96 mg/dL (ref 0.44–1.00)
Calcium: 9.1 mg/dL (ref 8.9–10.3)
GFR calc Af Amer: >60 mL/min (ref >60)
Glucose, Bld: 109 mg/dL — ABNORMAL HIGH (ref 70–99)
Potassium: 3.8 mmol/L (ref 3.5–5.1)
Sodium: 140 mmol/L (ref 135–145)
TOTAL PROTEIN: 7.1 g/dL (ref 6.5–8.1)

## 2018-05-19 LAB — CBC
HCT: 43.1 % (ref 36.0–46.0)
Hemoglobin: 14.1 g/dL (ref 12.0–15.0)
MCH: 30.6 pg (ref 26.0–34.0)
MCHC: 32.7 g/dL (ref 30.0–36.0)
MCV: 93.5 fL (ref 78.0–100.0)
PLATELETS: 211 10*3/uL (ref 150–400)
RBC: 4.61 MIL/uL (ref 3.87–5.11)
RDW: 12.2 % (ref 11.5–15.5)
WBC: 9 10*3/uL (ref 4.0–10.5)

## 2018-05-19 LAB — URINALYSIS, ROUTINE W REFLEX MICROSCOPIC
Bilirubin Urine: NEGATIVE
GLUCOSE, UA: NEGATIVE mg/dL
HGB URINE DIPSTICK: NEGATIVE
KETONES UR: 80 mg/dL — AB
LEUKOCYTES UA: NEGATIVE
Nitrite: NEGATIVE
PROTEIN: NEGATIVE mg/dL
Specific Gravity, Urine: 1.029 (ref 1.005–1.030)
pH: 5 (ref 5.0–8.0)

## 2018-05-19 LAB — LIPASE, BLOOD: Lipase: 29 U/L (ref 11–51)

## 2018-05-19 MED ORDER — PROMETHAZINE HCL 25 MG/ML IJ SOLN
25.0000 mg | Freq: Once | INTRAMUSCULAR | Status: AC
  Administered 2018-05-19: 25 mg via INTRAVENOUS
  Filled 2018-05-19: qty 1

## 2018-05-19 MED ORDER — METHOCARBAMOL 1000 MG/10ML IJ SOLN
1000.0000 mg | Freq: Once | INTRAVENOUS | Status: AC
  Administered 2018-05-19: 1000 mg via INTRAVENOUS
  Filled 2018-05-19: qty 10

## 2018-05-19 MED ORDER — DICYCLOMINE HCL 20 MG PO TABS: 20.0000 mg | ORAL_TABLET | Freq: Three times a day (TID) | ORAL | 0 refills | Status: DC | PRN

## 2018-05-19 MED ORDER — SODIUM CHLORIDE 0.9 % IV BOLUS
1000.0000 mL | Freq: Once | INTRAVENOUS | Status: AC
  Administered 2018-05-19: 1000 mL via INTRAVENOUS

## 2018-05-19 MED ORDER — ONDANSETRON 4 MG PO TBDP: 4.0000 mg | ORAL_TABLET | Freq: Three times a day (TID) | ORAL | 0 refills | Status: DC | PRN

## 2018-05-19 MED ORDER — METHOCARBAMOL 1000 MG/10ML IJ SOLN
1000.0000 mg | Freq: Once | INTRAMUSCULAR | Status: DC
  Filled 2018-05-19: qty 10

## 2018-05-19 MED ORDER — ONDANSETRON HCL 4 MG/2ML IJ SOLN
4.0000 mg | Freq: Once | INTRAMUSCULAR | Status: AC
  Administered 2018-05-19: 4 mg via INTRAVENOUS
  Filled 2018-05-19: qty 2

## 2018-05-19 MED ORDER — DICYCLOMINE HCL 10 MG/ML IM SOLN
10.0000 mg | Freq: Once | INTRAMUSCULAR | Status: AC
  Administered 2018-05-19: 10 mg via INTRAMUSCULAR
  Filled 2018-05-19: qty 2
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
## 2018-05-19 NOTE — ED Triage Notes (Addendum)
Pt presents with R sided pain for months, [facility], reports onset of R sided abdominal pain x 3 days with nausea, vomiting and diarrhea.  Pt reports back spasms that are generalized and radiate into R leg, reports recent falls.  Pt reports h/o CRP S, with spinal stimulator placed in March 2019.

## 2018-06-27 ENCOUNTER — Other Ambulatory Visit: Payer: Self-pay | Admitting: Obstetrics and Gynecology

## 2018-06-27 DIAGNOSIS — Z1231 Encounter for screening mammogram for malignant neoplasm of breast: Secondary | ICD-10-CM

## 2018-08-08 ENCOUNTER — Ambulatory Visit
Admission: RE | Admit: 2018-08-08 | Discharge: 2018-08-08 | Disposition: A | Discharge status: Home / Self Care | Payer: No Typology Code available for payment source | Source: Ambulatory Visit | Attending: Obstetrics and Gynecology | Admitting: Obstetrics and Gynecology

## 2018-08-08 ENCOUNTER — Other Ambulatory Visit: Payer: Self-pay | Admitting: Obstetrics and Gynecology

## 2018-08-08 DIAGNOSIS — Z1231 Encounter for screening mammogram for malignant neoplasm of breast: Secondary | ICD-10-CM

## 2019-03-26 ENCOUNTER — Emergency Department
Admission: EM | Admit: 2019-03-26 | Discharge: 2019-03-27 | Disposition: A | Discharge status: Home / Self Care | Payer: Commercial Managed Care - PPO | Attending: Emergency Medicine | Admitting: Emergency Medicine

## 2019-03-26 ENCOUNTER — Other Ambulatory Visit: Payer: Self-pay

## 2019-03-26 ENCOUNTER — Emergency Department: Payer: Commercial Managed Care - PPO

## 2019-03-26 ENCOUNTER — Encounter: Payer: Self-pay | Admitting: Emergency Medicine

## 2019-03-26 DIAGNOSIS — G9050 Complex regional pain syndrome I, unspecified: Secondary | ICD-10-CM

## 2019-03-26 DIAGNOSIS — Z79899 Other long term (current) drug therapy: Secondary | ICD-10-CM | POA: Diagnosis not present

## 2019-03-26 DIAGNOSIS — R112 Nausea with vomiting, unspecified: Secondary | ICD-10-CM

## 2019-03-26 DIAGNOSIS — Z87891 Personal history of nicotine dependence: Secondary | ICD-10-CM | POA: Insufficient documentation

## 2019-03-26 HISTORY — DX: Complex regional pain syndrome I, unspecified: G90.50

## 2019-03-26 LAB — CBC WITH DIFFERENTIAL/PLATELET
Abs Immature Granulocytes: 0.04 10*3/uL (ref 0.00–0.07)
Basophils Absolute: 0.1 10*3/uL (ref 0.0–0.1)
Basophils Relative: 1 %
Eosinophils Absolute: 0 10*3/uL (ref 0.0–0.5)
Eosinophils Relative: 0 %
HCT: 43 % (ref 36.0–46.0)
Hemoglobin: 14.3 g/dL (ref 12.0–15.0)
Immature Granulocytes: 0 %
Lymphocytes Relative: 18 %
Lymphs Abs: 1.8 10*3/uL (ref 0.7–4.0)
MCH: 30.4 pg (ref 26.0–34.0)
MCHC: 33.3 g/dL (ref 30.0–36.0)
MCV: 91.3 fL (ref 80.0–100.0)
Monocytes Absolute: 0.4 10*3/uL (ref 0.1–1.0)
Monocytes Relative: 4 %
Neutro Abs: 7.4 10*3/uL (ref 1.7–7.7)
Neutrophils Relative %: 77 %
Platelets: 243 10*3/uL (ref 150–400)
RBC: 4.71 MIL/uL (ref 3.87–5.11)
RDW: 11.6 % (ref 11.5–15.5)
WBC: 9.7 10*3/uL (ref 4.0–10.5)
nRBC: 0 % (ref 0.0–0.2)

## 2019-03-26 LAB — TROPONIN I (HIGH SENSITIVITY): Troponin I (High Sensitivity): <2 ng/L (ref <18)

## 2019-03-26 LAB — BASIC METABOLIC PANEL
Anion gap: 12 (ref 5–15)
BUN: 13 mg/dL (ref 6–20)
CO2: 20 mmol/L — ABNORMAL LOW (ref 22–32)
Calcium: 9.1 mg/dL (ref 8.9–10.3)
Chloride: 107 mmol/L (ref 98–111)
Creatinine, Ser: 0.75 mg/dL (ref 0.44–1.00)
GFR calc Af Amer: >60 mL/min (ref >60)
GFR calc non Af Amer: >60 mL/min (ref >60)
Glucose, Bld: 99 mg/dL (ref 70–99)
Potassium: 3.6 mmol/L (ref 3.5–5.1)
Sodium: 139 mmol/L (ref 135–145)

## 2019-03-26 LAB — LIPASE, BLOOD: Lipase: 29 U/L (ref 11–51)

## 2019-03-26 MED ORDER — DIPHENHYDRAMINE HCL 50 MG/ML IJ SOLN
25.0000 mg | INTRAMUSCULAR | Status: AC
  Administered 2019-03-27: 25 mg via INTRAVENOUS
  Filled 2019-03-26: qty 1

## 2019-03-26 MED ORDER — KETOROLAC TROMETHAMINE 30 MG/ML IJ SOLN
15.0000 mg | Freq: Once | INTRAMUSCULAR | Status: AC
  Administered 2019-03-27: 15 mg via INTRAVENOUS
  Filled 2019-03-26: qty 1

## 2019-03-26 MED ORDER — HALOPERIDOL LACTATE 5 MG/ML IJ SOLN
5.0000 mg | Freq: Once | INTRAMUSCULAR | Status: AC
  Administered 2019-03-27: 5 mg via INTRAVENOUS
  Filled 2019-03-26: qty 1

## 2019-03-26 MED ORDER — SODIUM CHLORIDE 0.9 % IV BOLUS
1000.0000 mL | Freq: Once | INTRAVENOUS | Status: AC
  Administered 2019-03-27: 1000 mL via INTRAVENOUS

## 2019-03-26 MED ORDER — DEXAMETHASONE SODIUM PHOSPHATE 10 MG/ML IJ SOLN
10.0000 mg | Freq: Once | INTRAMUSCULAR | Status: AC
  Administered 2019-03-27: 10 mg via INTRAVENOUS
  Filled 2019-03-26: qty 1

## 2019-03-26 NOTE — ED Triage Notes (Signed)
Pt arrives via POV to triage with c/o emesis leading to chest pain. Pt states that she is feeling pain all over at this time. Pt states that her right leg is hurting and that is where her pain started. Pt has a stimulator which has been off x 2 weeks. Pt is in NAD.
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
## 2019-03-27 LAB — HEPATIC FUNCTION PANEL
ALT: 19 U/L (ref 0–44)
AST: 23 U/L (ref 15–41)
Albumin: 4.7 g/dL (ref 3.5–5.0)
Alkaline Phosphatase: 32 U/L — ABNORMAL LOW (ref 38–126)
Bilirubin, Direct: 0.1 mg/dL (ref 0.0–0.2)
Indirect Bilirubin: 0.8 mg/dL (ref 0.3–0.9)
Total Bilirubin: 0.9 mg/dL (ref 0.3–1.2)
Total Protein: 7.9 g/dL (ref 6.5–8.1)

## 2019-03-27 MED ORDER — ONDANSETRON 4 MG PO TBDP: ORAL_TABLET | ORAL | 0 refills | Status: DC

## 2019-06-21 ENCOUNTER — Ambulatory Visit: Payer: PRIVATE HEALTH INSURANCE | Admitting: Physician Assistant

## 2019-06-22 ENCOUNTER — Encounter: Payer: Self-pay | Admitting: Physician Assistant

## 2019-06-22 ENCOUNTER — Other Ambulatory Visit: Payer: Self-pay

## 2019-06-22 ENCOUNTER — Ambulatory Visit (INDEPENDENT_AMBULATORY_CARE_PROVIDER_SITE_OTHER): Payer: Commercial Managed Care - PPO | Admitting: Physician Assistant

## 2019-06-22 VITALS — BP 124/78 | HR 73 | Temp 97.1°F | Wt 170.0 lb

## 2019-06-22 DIAGNOSIS — M62838 Other muscle spasm: Secondary | ICD-10-CM

## 2019-06-22 DIAGNOSIS — E538 Deficiency of other specified B group vitamins: Secondary | ICD-10-CM

## 2019-06-22 DIAGNOSIS — M79A21 Nontraumatic compartment syndrome of right lower extremity: Secondary | ICD-10-CM

## 2019-06-22 NOTE — Patient Instructions (Signed)
Myoclonus Myoclonus is the sudden and quick movement of the muscles. It may include muscle jerks, twitches, or spasms that happen without a person's control (involuntary). There are different types of myoclonus. One type, called physiologic myoclonus, occurs normally in most people and rarely needs treatment. This type includes:  Hiccups.  Sleep starts, or twitching during sleep. Other types are caused by an underlying condition and may require treatment. What are the causes? The cause of this condition varies depending on the type of myoclonus. Physiologic myoclonus, such as hiccups, results from normal actions of the body. Other causes include:  Genetic condition or disease. This causes a type of myoclonus called essential myoclonus.  Epilepsy with seizures. This causes epileptic myoclonus.  Other underlying conditions cause secondary or symptomatic myoclonus. These include: ? Side effects of certain medicines. ? Metabolic conditions. ? Head, brain, or spinal injuries. ? Stroke. ? Nervous system conditions such as dementia, Parkinson disease, and Alzheimer disease. ? Infections. ? Poisoning. ? Brain tumors. ? Not having enough oxygen. What are the signs or symptoms? The main symptom of this condition is sudden spasms, jerking, or uncontrollable movements. Symptoms:  May occur in only one area of the body or over the entire body.  May come and go and may vary in intensity.  May make it hard for you to eat, speak, or walk. How is this diagnosed?  This condition is diagnosed based on your medical history, a review of your symptoms, and a physical exam. You may also have tests, including:  Electroencephalogram (EEG). This test checks the electrical activity of the brain.  Electromyogram (EMG). This test measures electrical activity in the muscles.  Imaging tests such as an MRI or CT scan.  Lumbar puncture (spinal tap) to check spinal fluid for infection or  inflammation.  Blood tests.  Urine tests. How is this treated? Treatment for this condition depends on the underlying condition and the severity of your symptoms. Treatment may include:  Medicines, such as tranquilizer and anti-seizure medicines.  Injections of botulinum toxins.  Treating the underlying cause, such as having a tumor removed or taking antibiotic medicine for an infection. Physiologic myoclonus does not require treatment. Follow these instructions at home:  Take over-the-counter and prescription medicines only as told by your health care provider.  If you are taking tranquilizer or anti-seizure medicines, do not drive or use heavy machinery until your body adjusts to the medicine. These medicines may cause drowsiness.  Keep a journal of your symptoms and the things that seem to trigger them or make them worse. Also, try to identity the things that make them better. Share this information with your health care provider.  Keep all follow-up visits as told by your health care provider. This is important. Contact a health care provider if:  You have severe pain and medicines do not help.  You have problems taking your medicines.  Your twitches, jerks, or spasms get worse. Get help right away if:  You have a seizure.  You have a reaction to your medicines, such as a rash, trouble breathing, or swelling.  You have trouble staying alert. Summary  Myoclonus is the sudden and quick movement of the muscles. It may include muscle jerks, twitches, or spasms that happen without a person's control (involuntary).  There are different types of myoclonus. Physiologic myoclonus occurs normally in most people and rarely needs treatment. Other types of myoclonus are caused by an underlying condition.  Treatment for this condition depends on the underlying condition  and the severity of your symptoms.  If you are taking tranquilizer or anti-seizure medicines, do not drive or use  heavy machinery until your body adjusts to the medicine. These medicines may cause drowsiness. This information is not intended to replace advice given to you by your health care provider. Make sure you discuss any questions you have with your health care provider. Document Released: 08/14/2002 Document Revised: 08/06/2017 Document Reviewed: 12/03/2016 Elsevier Patient Education  2020 [employer].

## 2019-06-22 NOTE — Progress Notes (Signed)
Patient: Michele Estrada Female    DOB: 04-08-1971   48 y.o.   MRN: [id number] Visit Date: 06/22/2019  Today's Provider: Trey Sailors, PA-C   Chief Complaint  Patient presents with  . Referral    Referral for Neurology   Subjective:     HPI   Needs referral to Neurology.  Pt would like to go to [facility]. She was evaluated by neurology Dr. Roxanna Mew at [facility]. Patient learned her B12 is low and was upset that neurologist didn't discuss with her that B12 could mimic MS symptoms. Patient reports neurologist was looking at old MRIs of her brain. Patient reports neurologist wouldn't consider   She reports she has a filed neurostimulator which shocks her even though it's off.   Reports she had 5-10 minutes jerking episode and was not aware. Neurologist said it wasn't a seizure and she wants to know what it was.   Pt also needs to have B12 checked.   Reports people have a difficult time sticking her and says nobody can touch her right arm to the CRPS and the phlebotomist recommended she get a port due to this.   Allergies  Allergen Reactions  . Prozac [Fluoxetine Hcl] Other (See Comments)    High dose caused seizures.  . Estrogens Other (See Comments)    DVT on BCPs  . Iron Hives  . Neurontin [Gabapentin] Other (See Comments)    May have made moody and maybe increased migraine frequency.  . Pregabalin     Migraines     Current Outpatient Medications:  .  baclofen (LIORESAL) 20 MG tablet, TAKE 1 TABLET BY MOUTH 3 TIMES DAILY FOR30 DAYS., Disp: , Rfl:  .  dicyclomine (BENTYL) 20 MG tablet, Take 1 tablet (20 mg total) by mouth 3 (three) times daily as needed for spasms., Disp: 10 tablet, Rfl: 0 .  DULoxetine (CYMBALTA) 60 MG capsule, Take 120 mg by mouth daily. , Disp: , Rfl: 5 .  levETIRAcetam (KEPPRA) 500 MG tablet, Take by mouth., Disp: , Rfl:  .  tiZANidine (ZANAFLEX) 4 MG tablet, Take 4 mg by mouth 3 (three) times daily. , Disp: , Rfl: 2 .  topiramate  (TOPAMAX) 100 MG tablet, TAKE 1 TABLET BY MOUTH TWICE A DAY (Patient taking differently: Take 400 mg by mouth at bedtime. ), Disp: 60 tablet, Rfl: 2 .  diphenhydrAMINE (BENADRYL) 25 MG tablet, Take 25 mg by mouth every 6 (six) hours as needed for sleep., Disp: , Rfl:  .  ondansetron (ZOFRAN ODT) 4 MG disintegrating tablet, Allow 1-2 tablets to dissolve in your mouth every 8 hours as needed for nausea/vomiting (Patient not taking: Reported on 06/22/2019), Disp: 30 tablet, Rfl: 0  Review of Systems  Constitutional: Negative.   Respiratory: Negative.   Cardiovascular: Negative.   Gastrointestinal: Negative.   Musculoskeletal: Negative.   Neurological: Negative for dizziness, light-headedness and headaches.    Social History   Tobacco Use  . Smoking status: Former Smoker    Packs/day: 0.80    Years: 17.00    Pack years: 13.60    Types: Cigarettes  . Smokeless tobacco: Never Used  Substance Use Topics  . Alcohol use: Not Currently    Alcohol/week: 1.0 standard drinks    Types: 1 Standard drinks or equivalent per week    Comment: rarely      Objective:   BP 124/78 (BP Location: Left Arm, Patient Position: Sitting, Cuff Size: Large)   Pulse 73  Temp (!) 97.1 F (36.2 C) (Temporal)   Wt 170 lb (77.1 kg)   BMI 32.12 kg/m  Vitals:   06/22/19 1505  BP: 124/78  Pulse: 73  Temp: (!) 97.1 F (36.2 C)  TempSrc: Temporal  Weight: 170 lb (77.1 kg)  Body mass index is 32.12 kg/m.   Physical Exam Constitutional:      Appearance: Normal appearance.  Skin:    General: Skin is warm and dry.  Neurological:     Mental Status: She is alert. Mental status is at baseline.  Psychiatric:        Mood and Affect: Mood is anxious.      No results found for any visits on 06/22/19.     Assessment & Plan    1. 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 I absolutely do not think she needs a port. Our phlebotomist was able to  draw blood using a vein in her hand.  - Ambulatory referral to Neurology  2. Muscle spasms of both lower extremities  - Ambulatory referral to Neurology  3. B12 deficiency  - B12  The entirety of the information documented in the History of Present Illness, Review of Systems and Physical Exam were personally obtained by me. Portions of this information were initially documented by Ashley Royalty, CMA and reviewed by me for thoroughness and accuracy.       Trinna Post, PA-C  [facility]

## 2019-06-23 LAB — VITAMIN B12: Vitamin B-12: 890 pg/mL (ref 232–1245)

## 2019-08-16 ENCOUNTER — Telehealth: Payer: Self-pay | Admitting: Neurology

## 2019-08-16 NOTE — Telephone Encounter (Signed)
I called the patient since she mentioned suicidal thoughts to our referral coordinator.  The patient explained her frustration about wanting a second opinion.  I again advised that I . Have reviewed her notes and they did not feel that they had anything additional to add.  I did ask the patient if she had thoughts of harming herself or anyone else.  She states that she is safe. No thoughts and she is currently with her granddaughter. She again discusses her diagnosis but eventually hangs up the phone.

## 2019-08-16 NOTE — Telephone Encounter (Signed)
Called patient to inform her of Dr. Garth Bigness decision. I let her know that he had reviewed everything and didn't feel like there was anything more he could offer in regards to her care. His suggestion was to continue to be seen by [facility]. Patient became very upset and stated her doctor at [facility] has done nothing to help her. She believes she has MS that has gone undiagnosed. I mentioned possibly being referred to another office in [place], such as [facility], but she specifically wanted to see Dr. Felecia Shelling. Towards the end of our conversation, patient stated "This is a suicide disease. How would Dr. Felecia Shelling feel if I committed suicide? How would he like to have that on his conscious?" I consulted with manager Megan on this encounter and next steps.

## 2019-08-16 NOTE — Telephone Encounter (Signed)
Pt is a pt of Dr. Greer Pickerel. Pt is requesting to see Dr. 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 Pt states that she needs to be Seen For MS. Can she switch her care?

## 2019-08-16 NOTE — Telephone Encounter (Signed)
She has been seen extensively at [facility] by Neurology.     There is nothing I can add to her care and she should continue to be seen there

## 2019-10-04 ENCOUNTER — Encounter: Payer: Self-pay | Admitting: Physician Assistant

## 2019-10-04 ENCOUNTER — Ambulatory Visit (INDEPENDENT_AMBULATORY_CARE_PROVIDER_SITE_OTHER): Payer: Commercial Managed Care - PPO | Admitting: Physician Assistant

## 2019-10-04 ENCOUNTER — Other Ambulatory Visit: Payer: Self-pay

## 2019-10-04 VITALS — BP 108/79 | HR 84 | Temp 96.8°F | Wt 168.0 lb

## 2019-10-04 DIAGNOSIS — R202 Paresthesia of skin: Secondary | ICD-10-CM | POA: Diagnosis not present

## 2019-10-04 DIAGNOSIS — F332 Major depressive disorder, recurrent severe without psychotic features: Secondary | ICD-10-CM

## 2019-10-04 DIAGNOSIS — R252 Cramp and spasm: Secondary | ICD-10-CM

## 2019-10-04 DIAGNOSIS — R569 Unspecified convulsions: Secondary | ICD-10-CM
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
## 2019-10-04 NOTE — Patient Instructions (Signed)
Paresthesia Paresthesia is a burning or prickling feeling. This feeling can happen in any part of the body. It often happens in the hands, arms, legs, or feet. Usually, it is not painful. In most cases, the feeling goes away in a short time and is not a sign of a serious problem. If you have paresthesia that lasts a long time, you may need to be seen by your doctor. Follow these instructions at home: Alcohol use   Do not drink alcohol if: ? Your doctor tells you not to drink. ? You are pregnant, may be pregnant, or are planning to become pregnant.  If you drink alcohol: ? Limit how much you use to:  0-1 drink a day for women.  0-2 drinks a day for men. ? Be aware of how much alcohol is in your drink. In the [place] one drink equals one 12 oz bottle of beer (355 mL), one 5 oz glass of wine (148 mL), or one 1 oz glass of hard liquor (44 mL). Nutrition   Eat a healthy diet. This includes: ? Eating foods that have a lot of fiber in them, such as fresh fruits and vegetables, whole grains, and beans. ? Limiting foods that have a lot of fat and processed sugars in them, such as fried or sweet foods. General instructions  Take over-the-counter and prescription medicines only as told by your doctor.  Do not use any products that have nicotine or tobacco in them, such as cigarettes and e-cigarettes. If you need help quitting, ask your doctor.  If you have diabetes, work with your doctor to make sure your blood sugar stays in a healthy range.  If your feet feel numb: ? Check for redness, warmth, and swelling every day. ? Wear padded socks and comfortable shoes. These help protect your feet.  Keep all follow-up visits as told by your doctor. This is important. Contact a doctor if:  You have paresthesia that gets worse or does not go away.  Your burning or prickling feeling gets worse when you walk.  You have pain or cramps.  You feel dizzy.  You have a rash. Get help right away if  you:  Feel weak.  Have trouble walking or moving.  Have problems speaking, understanding, or seeing.  Feel confused.  Cannot control when you pee (urinate) or poop (have a bowel movement).  Lose feeling (have numbness) after an injury.  Have new weakness in an arm or leg.  Pass out (faint). Summary  Paresthesia is a burning or prickling feeling. It often happens in the hands, arms, legs, or feet.  In most cases, the feeling goes away in a short time and is not a sign of a serious problem.  If you have paresthesia that lasts a long time, you may need to be seen by your doctor. This information is not intended to replace advice given to you by your health care provider. Make sure you discuss any questions you have with your health care provider. Document Revised: 09/19/2018 Document Reviewed: 09/02/2017 Elsevier Patient Education  2020 Elsevier Inc.  

## 2019-11-03 ENCOUNTER — Telehealth: Payer: Self-pay | Admitting: Physician Assistant

## 2019-11-03 NOTE — Telephone Encounter (Signed)
Handicapped license form signed. Just needs my license number and then we can call patient to pick up.

## 2019-11-03 NOTE — Telephone Encounter (Signed)
Patient advised.

## 2019-11-06 LAB — HM COLONOSCOPY

## 2019-11-30 DIAGNOSIS — K8681 Exocrine pancreatic insufficiency: Secondary | ICD-10-CM | POA: Insufficient documentation

## 2019-12-14 ENCOUNTER — Telehealth: Payer: Self-pay

## 2019-12-14 NOTE — Progress Notes (Signed)
Patient: Michele Estrada Female    DOB: 02-Sep-1971   49 y.o.   MRN: [id number] Visit Date: 12/15/2019  Today's Provider: Trey Sailors, PA-C   Chief Complaint  Patient presents with  . Emesis   Subjective:     Virtual Visit via Telephone Note  I connected with Michele Estrada on 12/14/19 at  9:00 AM EDT by telephone and verified that I am speaking with the correct person using two identifiers.  Location: Patient: Home Provider: Office   I discussed the limitations, risks, security and privacy concerns of performing an evaluation and management service by telephone and the availability of in person appointments. I also discussed with the patient that there may be a patient responsible charge related to this service. The patient expressed understanding and agreed to proceed.  Emesis  This is a new problem. The current episode started in the past 7 days. The problem occurs intermittently. The problem has been unchanged. The emesis has an appearance of stomach contents. There has been no fever. Associated symptoms include abdominal pain, diarrhea and dizziness. Pertinent negatives include no chills, coughing, fever, myalgias, sweats, URI or weight loss. She has tried increased fluids and bed rest for the symptoms.   Reports she has been vomiting since Tuesday night as well as diarrhea. 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 She has chronic diarrhea and vomiting. She denies fevers, chills, SOB. She got tested prior to colonoscopy and endoscopy but not since she has been having these symptoms. On Tuesday she ate some leftover [place] food and afterwards she felt sick.   Allergies  Allergen Reactions  . Prozac [Fluoxetine Hcl] Other (See Comments)    High dose caused seizures.  . Estrogens Other (See Comments)    DVT on BCPs  . Gabapentin Other (See Comments)    May have made moody and  maybe increased migraine frequency. "May have made moody and maybe increased migraine frequency" May have made moody and maybe increased migraine frequency. May have made moody and maybe increased migraine frequency.  . Iron Hives  . Pregabalin Anxiety and Other (See Comments)    Migraines Migraines Migraines Migraines      Current Outpatient Medications:  .  ascorbic acid (VITAMIN C) 1000 MG tablet, Take by mouth., Disp: , Rfl:  .  B Complex Vitamins (VITAMIN B COMPLEX) TABS, Take by mouth., Disp: , Rfl:  .  baclofen (LIORESAL) 20 MG tablet, TAKE 1 TABLET BY MOUTH 3 TIMES DAILY FOR30 DAYS., Disp: , Rfl:  .  Biotin 10 MG CAPS, Take by mouth., Disp: , Rfl:  .  Cholecalciferol 25 MCG (1000 UT) capsule, Take by mouth., Disp: , Rfl:  .  dicyclomine (BENTYL) 20 MG tablet, Take 1 tablet (20 mg total) by mouth 3 (three) times daily as needed for spasms., Disp: 10 tablet, Rfl: 0 .  diphenhydrAMINE (BENADRYL) 25 MG tablet, Take 25 mg by mouth every 6 (six) hours as needed for sleep., Disp: , Rfl:  .  diphenhydrAMINE (SOMINEX) 25 MG tablet, Take by mouth., Disp: , Rfl:  .  DULoxetine (CYMBALTA) 60 MG capsule, Take 120 mg by mouth daily. , Disp: , Rfl: 5 .  levETIRAcetam (KEPPRA) 500 MG tablet, Take by mouth., Disp: , Rfl:  .  lipase/protease/amylase (CREON) 36000 UNITS CPEP capsule, Take 1 capsules with first bite of each meal, 1-2 capsules during each meal, and 1 capsule at  the end of each meal.  Take 1-2 capsules with each snack.  Allow for 3 meals 1 snack., Disp: , Rfl:  .  omeprazole (PRILOSEC) 40 MG capsule, Take by mouth., Disp: , Rfl:  .  Pediatric Multiple Vit-C-FA (CHEWABLE VITE CHILDRENS) CHEW, Chew by mouth., Disp: , Rfl:  .  pramipexole (MIRAPEX) 0.125 MG tablet, Take by mouth., Disp: , Rfl:  .  tiZANidine (ZANAFLEX) 4 MG tablet, Take 4 mg by mouth 3 (three) times daily. , Disp: , Rfl: 2 .  topiramate (TOPAMAX) 100 MG tablet, TAKE 1 TABLET BY MOUTH TWICE A DAY (Patient taking  differently: Take 400 mg by mouth at bedtime. ), Disp: 60 tablet, Rfl: 2 .  ondansetron (ZOFRAN ODT) 4 MG disintegrating tablet, Allow 1-2 tablets to dissolve in your mouth every 8 hours as needed for nausea/vomiting (Patient not taking: Reported on 06/22/2019), Disp: 30 tablet, Rfl: 0  Review of Systems  Constitutional: Negative for chills, fever and weight loss.  HENT: Negative.   Eyes: Negative.   Respiratory: Negative for cough.   Cardiovascular: Negative.   Gastrointestinal: Positive for abdominal pain, diarrhea and vomiting.  Endocrine: Negative.   Genitourinary: Negative.   Musculoskeletal: Negative.  Negative for myalgias.  Skin: Negative.   Allergic/Immunologic: Negative.   Neurological: Positive for dizziness.  Hematological: Negative.   Psychiatric/Behavioral: Negative.     Social History   Tobacco Use  . Smoking status: Former Smoker    Packs/day: 0.80    Years: 17.00    Pack years: 13.60    Types: Cigarettes  . Smokeless tobacco: Never Used  Substance Use Topics  . Alcohol use: Not Currently    Alcohol/week: 1.0 standard drinks    Types: 1 Standard drinks or equivalent per week    Comment: rarely      Objective:   There were no vitals taken for this visit. There were no vitals filed for this visit.There is no height or weight on file to calculate BMI.   Physical Exam   No results found for any visits on 12/15/19.     Assessment & Plan    1. Nausea and vomiting, intractability of vomiting not specified, unspecified vomiting type  Counseled on possibility of viral or bacterial gastroenteritis, may be acute on chronic flare of GI issues.   - ondansetron (ZOFRAN-ODT) 4 MG disintegrating tablet; Take 1 tablet (4 mg total) by mouth every 8 (eight) hours as needed for nausea or vomiting.  Dispense: 20 tablet; Refill: 0  2. Pancreatic insufficiency  Followed by [facility].   I discussed the assessment and treatment plan with the patient. The patient  was provided an opportunity to ask questions and all were answered. The patient agreed with the plan and demonstrated an understanding of the instructions.   The patient was advised to call back or seek an in-person evaluation if the symptoms worsen or if the condition fails to improve as anticipated.  I provided 15 minutes of non-face-to-face time during this encounter.    Trinna Post, PA-C  [facility]

## 2019-12-14 NOTE — Telephone Encounter (Signed)
She has been scheduled for tomorrow morning and will go to the ED if things worsen tonight.

## 2019-12-14 NOTE — Telephone Encounter (Signed)
Copied from CRM [id number]. Topic: General - Inquiry >> Dec 14, 2019 11:07 AM Leary Roca wrote: Reason for CRM: Patient is wanting a prescription for a new medication and would like a call back . Please advise

## 2019-12-14 NOTE — Telephone Encounter (Signed)
If she declines going to the ER she may be scheduled virtually.

## 2019-12-14 NOTE — Telephone Encounter (Signed)
I spoke with the patient. She stated that she has been non stop vomitting for the past 2 day and has been unable to keep anything down, She stated that she is very weak and with the vomiting it has increased her heart rate and made her unable to keep her other medications down. With her symptoms she was advised to call 911 to be evaluated and transported to the hospital for imaging and IV fluids. She stated that she is a Chronic Pain Patient with multiple different health issues that she could not stand the long wait and she really did not want to go to the hospital. She stated that she would call her husband to see what he thinks and if he could take her if needed. She gave verbal understanding about my recommendations.

## 2019-12-15 ENCOUNTER — Ambulatory Visit (INDEPENDENT_AMBULATORY_CARE_PROVIDER_SITE_OTHER): Payer: Commercial Managed Care - PPO | Admitting: Physician Assistant

## 2019-12-15 DIAGNOSIS — R112 Nausea with vomiting, unspecified: Secondary | ICD-10-CM

## 2019-12-15 DIAGNOSIS — K8689 Other specified diseases of pancreas: Secondary | ICD-10-CM | POA: Diagnosis not present

## 2019-12-15 DIAGNOSIS — R197 Diarrhea, unspecified: Secondary | ICD-10-CM | POA: Diagnosis not present

## 2019-12-15 MED ORDER — ONDANSETRON 4 MG PO TBDP: 4.0000 mg | ORAL_TABLET | Freq: Three times a day (TID) | ORAL | 0 refills | Status: DC | PRN

## 2019-12-15 NOTE — Patient Instructions (Signed)

## 2019-12-18 DIAGNOSIS — Z5309 Procedure and treatment not carried out because of other contraindication: Secondary | ICD-10-CM | POA: Insufficient documentation

## 2020-01-22 ENCOUNTER — Encounter: Payer: Self-pay | Admitting: Physician Assistant

## 2020-01-22 ENCOUNTER — Other Ambulatory Visit: Payer: Self-pay | Admitting: Neurology

## 2020-01-22 ENCOUNTER — Other Ambulatory Visit: Payer: Self-pay

## 2020-01-22 ENCOUNTER — Ambulatory Visit (INDEPENDENT_AMBULATORY_CARE_PROVIDER_SITE_OTHER): Payer: Commercial Managed Care - PPO | Admitting: Physician Assistant

## 2020-01-22 VITALS — BP 132/84 | HR 85 | Temp 97.1°F | Ht 61.0 in | Wt 179.4 lb

## 2020-01-22 DIAGNOSIS — G90521 Complex regional pain syndrome I of right lower limb: Secondary | ICD-10-CM

## 2020-01-22 DIAGNOSIS — D6861 Antiphospholipid syndrome: Secondary | ICD-10-CM

## 2020-01-22 DIAGNOSIS — D649 Anemia, unspecified: Secondary | ICD-10-CM

## 2020-01-22 DIAGNOSIS — Z0000 Encounter for general adult medical examination without abnormal findings: Secondary | ICD-10-CM | POA: Diagnosis not present

## 2020-01-22 DIAGNOSIS — K8681 Exocrine pancreatic insufficiency: Secondary | ICD-10-CM

## 2020-01-22 DIAGNOSIS — G259 Extrapyramidal and movement disorder, unspecified: Secondary | ICD-10-CM

## 2020-01-22 NOTE — Progress Notes (Signed)
Complete physical exam   Patient: Michele Estrada   DOB: September 02, 1971   49 y.o. Female  MRN: [id number] Visit Date: 01/22/2020  Today's healthcare provider: Trinna Post, PA-C   Chief Complaint  Patient presents with  . Annual Exam  I,Porsha C McClurkin,acting as a scribe for Trinna Post, PA-C.,have documented all relevant documentation on the behalf of Trinna Post, PA-C,as directed by  Trinna Post, PA-C while in the presence of Trinna Post, PA-C.  Subjective    Michele Estrada is a 49 y.o. female who presents today for a complete physical exam.  She reports consuming a low fat diet. The patient does not participate in regular exercise at present. She generally feels fairly well. She reports sleeping poorly. She does have additional problems to discuss today.  HPI   Patient reports that she have not get her mammogram due to them not allowing her service dog to stay in the office. Patient states that she will be getting her pap with OB-GYN doctor and that she is supposed to see them yearly due to hysterectomy showing precancerous cells on pathology. She plans to schedule with them to get PAP and mammogram. She sees Dr. Ouida Sills.   Patient states that she is not getting the COVID-19 vaccine.  Antiphospholipid Syndrome: Patient reports being diagnosed with this remotely. She was never advised to be on blood thinners or ASA. She reports concern due to eating too much vitamin K rich foods and this having a blood thinning effect on her with increased bruising.   She reports persistent bloating with EPI. She is followed by [facility] for this and takes creon which she reports gives her side effects.   Past Medical History:  Diagnosis Date  . Anxiety   . Bulging lumbar disc    PT has Ortho MD   . CRPS (complex regional pain syndrome type I)   . Dental abscess 07/04/2012  . Migraines   . Seizure [facility])    Past Surgical History:  Procedure Laterality Date  .  CHOLECYSTECTOMY    . LAPAROSCOPIC ASSISTED VAGINAL HYSTERECTOMY Bilateral 03/22/2014   Procedure: LAPAROSCOPIC ASSISTED VAGINAL HYSTERECTOMY, BILATERAL SALPINGECTOMY;  Surgeon: Olga Millers, MD;  Location: [facility];  Service: Gynecology;  Laterality: Bilateral;  . SHOULDER SURGERY    . TUBAL LIGATION    . uterine ablation    . VAGINAL HYSTERECTOMY    . WRIST SURGERY Left    Social History   Socioeconomic History  . Marital status: Married    Spouse name: Michele Estrada  . Number of children: 1  . Years of education: Bachelors  . Highest education level: Not on file  Occupational History  . 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 Smoking status: Former Smoker    Packs/day: 0.80    Years: 17.00    Pack years: 13.60    Types: Cigarettes  . Smokeless tobacco: Never Used  Substance and Sexual Activity  . Alcohol use: Not Currently    Alcohol/week: 1.0 standard drinks    Types: 1 Standard drinks or equivalent per week    Comment: rarely  . Drug use: No    Types: Cocaine    Comment: Hx of use.  Marland Kitchen Sexual activity: Yes    Partners: Male    Comment: long term monagamous relationship  Other Topics Concern  . Not on file  Social History  Narrative   Patient lives at home at home with her husband Michele Estrada) and son.   Patient works for [employer] and [employer].   Right handed   Caffeine One cup of coffee daily.      Social Determinants of Health   Financial Resource Strain:   . Difficulty of Paying Living Expenses:   Food Insecurity:   . Worried About Programme researcher, broadcasting/film/video in the Last Year:   . Barista in the Last Year:   Transportation Needs:   . Freight forwarder (Medical):   Marland Kitchen Lack of Transportation (Non-Medical):   Physical Activity:   . Days of Exercise per Week:   . Minutes of Exercise per Session:   Stress:   . Feeling of Stress :   Social Connections:   . Frequency of Communication with  Friends and Family:   . Frequency of Social Gatherings with Friends and Family:   . Attends Religious Services:   . Active Member of Clubs or Organizations:   . Attends Banker Meetings:   Marland Kitchen Marital Status:   Intimate Partner Violence:   . Fear of Current or Ex-Partner:   . Emotionally Abused:   Marland Kitchen Physically Abused:   . Sexually Abused:    Family Status  Relation Name Status  . Mother  Alive  . Father  Alive  . Sister  Alive  . Brother  Alive  . PGM  Deceased  . MGM  Deceased  . MGF  Deceased   Family History  Problem Relation Age of Onset  . Diabetes Mother   . Hypertension Mother   . Diabetes Father   . Hypertension Father   . Migraines Sister   . Endometriosis Sister   . Migraines Brother   . Diabetes Paternal Grandmother   . Lung cancer Maternal Grandmother   . Lung cancer Maternal Grandfather    Allergies  Allergen Reactions  . Prozac [Fluoxetine Hcl] Other (See Comments)    High dose caused seizures.  . Estrogens Other (See Comments)    DVT on BCPs  . Gabapentin Other (See Comments)    May have made moody and maybe increased migraine frequency. "May have made moody and maybe increased migraine frequency" May have made moody and maybe increased migraine frequency. May have made moody and maybe increased migraine frequency.  . Iron Hives  . Pregabalin Anxiety and Other (See Comments)    Migraines Migraines Migraines Migraines     Patient Care Team: Maryella Shivers as PCP - General (Physician Assistant) Eldred Manges, MD as Consulting Physician (Orthopedic Surgery)   Medications: Outpatient Medications Prior to Visit  Medication Sig  . ascorbic acid (VITAMIN C) 1000 MG tablet Take by mouth.  . B Complex Vitamins (VITAMIN B COMPLEX) TABS Take by mouth.  . baclofen (LIORESAL) 20 MG tablet TAKE 1 TABLET BY MOUTH 3 TIMES DAILY FOR30 DAYS.  Marland Kitchen Biotin 10 MG CAPS Take by mouth.  . Cholecalciferol 25 MCG (1000 UT) capsule Take by mouth.   . dicyclomine (BENTYL) 20 MG tablet Take 1 tablet (20 mg total) by mouth 3 (three) times daily as needed for spasms.  . diphenhydrAMINE (BENADRYL) 25 MG tablet Take 25 mg by mouth every 6 (six) hours as needed for sleep.  . diphenhydrAMINE (SOMINEX) 25 MG tablet Take by mouth.  . DULoxetine (CYMBALTA) 60 MG capsule Take 120 mg by mouth daily.   Marland Kitchen levETIRAcetam (KEPPRA) 500 MG tablet Take by mouth.  Marland Kitchen  lipase/protease/amylase (CREON) 36000 UNITS CPEP capsule Take 1 capsules with first bite of each meal, 1-2 capsules during each meal, and 1 capsule at the end of each meal.  Take 1-2 capsules with each snack.  Allow for 3 meals 1 snack.  Marland Kitchen omeprazole (PRILOSEC) 40 MG capsule Take by mouth.  . ondansetron (ZOFRAN-ODT) 4 MG disintegrating tablet Take 1 tablet (4 mg total) by mouth every 8 (eight) hours as needed for nausea or vomiting.  . pramipexole (MIRAPEX) 0.125 MG tablet Take by mouth.  Marland Kitchen tiZANidine (ZANAFLEX) 4 MG tablet Take 4 mg by mouth 3 (three) times daily.   Marland Kitchen topiramate (TOPAMAX) 100 MG tablet TAKE 1 TABLET BY MOUTH TWICE A DAY (Patient taking differently: Take 400 mg by mouth at bedtime. )  . Pediatric Multiple Vit-C-FA (CHEWABLE VITE CHILDRENS) CHEW Chew by mouth.   No facility-administered medications prior to visit.    Review of Systems  Constitutional: Positive for diaphoresis.  HENT: Positive for facial swelling and tinnitus.   Eyes: Positive for visual disturbance.  Respiratory: Negative.   Cardiovascular: Negative.   Gastrointestinal: Positive for abdominal distention.  Endocrine: Negative.   Genitourinary: Negative.   Musculoskeletal: Positive for arthralgias, back pain, myalgias, neck pain and neck stiffness.  Skin: Negative.   Allergic/Immunologic: Negative.   Neurological: Positive for headaches.  Hematological: Negative.   Psychiatric/Behavioral: Positive for sleep disturbance.      Objective    BP 132/84 (BP Location: Left Arm, Patient Position: Sitting, Cuff  Size: Normal)   Pulse 85   Temp (!) 97.1 F (36.2 C) (Temporal)   Ht 5\' 1"  (1.549 m)   Wt 179 lb 6.4 oz (81.4 kg)   SpO2 96%   BMI 33.90 kg/m    Physical Exam Constitutional:      Appearance: Normal appearance.  Cardiovascular:     Rate and Rhythm: Normal rate and regular rhythm.     Pulses: Normal pulses.     Heart sounds: Normal heart sounds.  Pulmonary:     Effort: Pulmonary effort is normal.     Breath sounds: Normal breath sounds.  Abdominal:     General: Abdomen is flat. Bowel sounds are normal.     Palpations: Abdomen is soft.  Skin:    General: Skin is warm and dry.  Neurological:     General: No focal deficit present.     Mental Status: She is alert and oriented to person, place, and time.  Psychiatric:        Mood and Affect: Mood normal.        Behavior: Behavior normal.       Depression Screen  PHQ 2/9 Scores 01/22/2020  PHQ - 2 Score 3  PHQ- 9 Score 9    No results found for any visits on 01/22/20.  Assessment & Plan    1. Annual physical exam Patient presents today for annual physical. Patient states that she will schedule appointment with OB-GYN to have mammogram and pap. - HIV Antibody (routine testing w rflx) - TSH - Lipid panel - Comprehensive metabolic panel - CBC with Differential/Platelet - Iron, TIBC and Ferritin Panel  2. Antiphospholipid syndrome (HCC)  Advised that I do not think vitamin K rich food will cause blood thinning. In fact, we use vitamin K to reverse the effects of blood thinners like warfarin and it is involved in the synthesis of coagulation factors. Will refer to hem/onc for further eval/tx of below.   - Ambulatory referral to Hematology  3. Anemia,  unspecified type  - Iron, TIBC and Ferritin Panel  4. CRPS  Followed by [facility]  5. Exocrine pancreatic insufficiency   Followed by [facility]  Routine Health Maintenance and Physical Exam  Exercise Activities and Dietary recommendations Goals    None     Immunization History  Administered Date(s) Administered  . Influenza Split 06/03/2012  . Tdap 06/03/2012    Health Maintenance  Topic Date Due  . HIV Screening  Never done  . COVID-19 Vaccine (1) 02/07/2020 (Originally 08/24/1989)  . INFLUENZA VACCINE  04/07/2020  . TETANUS/TDAP  06/03/2022    Discussed health benefits of physical activity, and encouraged her to engage in regular exercise appropriate for her age and condition.   F/u 1 year    I, Trey Sailors, PA-C, have reviewed all documentation for this visit. The documentation on 01/23/20 for the exam, diagnosis, procedures, and orders are all accurate and complete.    Maryella Shivers  [facility] [phone] (phone) [phone] (fax)  [facility]

## 2020-01-23 LAB — IRON,TIBC AND FERRITIN PANEL
Ferritin: 87 ng/mL (ref 15–150)
Iron Saturation: 25 % (ref 15–55)
Iron: 67 ug/dL (ref 27–159)
Total Iron Binding Capacity: 267 ug/dL (ref 250–450)
UIBC: 200 ug/dL (ref 131–425)

## 2020-01-23 LAB — CBC WITH DIFFERENTIAL/PLATELET
Basophils Absolute: 0.1 10*3/uL (ref 0.0–0.2)
Basos: 1 %
EOS (ABSOLUTE): 0.1 10*3/uL (ref 0.0–0.4)
Eos: 1 %
Hematocrit: 37.2 % (ref 34.0–46.6)
Hemoglobin: 13 g/dL (ref 11.1–15.9)
Immature Grans (Abs): 0 10*3/uL (ref 0.0–0.1)
Immature Granulocytes: 0 %
Lymphocytes Absolute: 2.1 10*3/uL (ref 0.7–3.1)
Lymphs: 22 %
MCH: 31.6 pg (ref 26.6–33.0)
MCHC: 34.9 g/dL (ref 31.5–35.7)
MCV: 91 fL (ref 79–97)
Monocytes Absolute: 0.6 10*3/uL (ref 0.1–0.9)
Monocytes: 6 %
Neutrophils Absolute: 6.5 10*3/uL (ref 1.4–7.0)
Neutrophils: 70 %
Platelets: 292 10*3/uL (ref 150–450)
RBC: 4.11 x10E6/uL (ref 3.77–5.28)
RDW: 11.6 % — ABNORMAL LOW (ref 11.7–15.4)
WBC: 9.3 10*3/uL (ref 3.4–10.8)

## 2020-01-23 LAB — COMPREHENSIVE METABOLIC PANEL
ALT: 32 IU/L (ref 0–32)
AST: 25 IU/L (ref 0–40)
Albumin/Globulin Ratio: 2 (ref 1.2–2.2)
Albumin: 4.5 g/dL (ref 3.8–4.8)
Alkaline Phosphatase: 43 IU/L — ABNORMAL LOW (ref 48–121)
BUN/Creatinine Ratio: 9 (ref 9–23)
BUN: 9 mg/dL (ref 6–24)
Bilirubin Total: 0.2 mg/dL (ref 0.0–1.2)
CO2: 17 mmol/L — ABNORMAL LOW (ref 20–29)
Calcium: 9.2 mg/dL (ref 8.7–10.2)
Chloride: 106 mmol/L (ref 96–106)
Creatinine, Ser: 0.96 mg/dL (ref 0.57–1.00)
GFR calc Af Amer: 82 mL/min/{1.73_m2} (ref >59)
GFR calc non Af Amer: 71 mL/min/{1.73_m2} (ref >59)
Globulin, Total: 2.3 g/dL (ref 1.5–4.5)
Glucose: 90 mg/dL (ref 65–99)
Potassium: 4 mmol/L (ref 3.5–5.2)
Sodium: 138 mmol/L (ref 134–144)
Total Protein: 6.8 g/dL (ref 6.0–8.5)

## 2020-01-23 LAB — LIPID PANEL
Chol/HDL Ratio: 3.3 ratio (ref 0.0–4.4)
Cholesterol, Total: 227 mg/dL — ABNORMAL HIGH (ref 100–199)
HDL: 68 mg/dL (ref >39)
LDL Chol Calc (NIH): 143 mg/dL — ABNORMAL HIGH (ref 0–99)
Triglycerides: 91 mg/dL (ref 0–149)
VLDL Cholesterol Cal: 16 mg/dL (ref 5–40)

## 2020-01-23 LAB — HIV ANTIBODY (ROUTINE TESTING W REFLEX): HIV Screen 4th Generation wRfx: NONREACTIVE

## 2020-01-23 LAB — TSH: TSH: 1.84 u[IU]/mL (ref 0.450–4.500)

## 2020-01-23 NOTE — Patient Instructions (Signed)
Health Maintenance, Female Adopting a healthy lifestyle and getting preventive care are important in promoting health and wellness. Ask your health care provider about:  The right schedule for you to have regular tests and exams.  Things you can do on your own to prevent diseases and keep yourself healthy. What should I know about diet, weight, and exercise? Eat a healthy diet   Eat a diet that includes plenty of vegetables, fruits, low-fat dairy products, and lean protein.  Do not eat a lot of foods that are high in solid fats, added sugars, or sodium. Maintain a healthy weight Body mass index (BMI) is used to identify weight problems. It estimates body fat based on height and weight. Your health care provider can help determine your BMI and help you achieve or maintain a healthy weight. Get regular exercise Get regular exercise. This is one of the most important things you can do for your health. Most adults should:  Exercise for at least 150 minutes each week. The exercise should increase your heart rate and make you sweat (moderate-intensity exercise).  Do strengthening exercises at least twice a week. This is in addition to the moderate-intensity exercise.  Spend less time sitting. Even light physical activity can be beneficial. Watch cholesterol and blood lipids Have your blood tested for lipids and cholesterol at 47 years of age, then have this test every 5 years. Have your cholesterol levels checked more often if:  Your lipid or cholesterol levels are high.  You are older than 47 years of age.  You are at high risk for heart disease. What should I know about cancer screening? Depending on your health history and family history, you may need to have cancer screening at various ages. This may include screening for:  Breast cancer.  Cervical cancer.  Colorectal cancer.  Skin cancer.  Lung cancer. What should I know about heart disease, diabetes, and high blood  pressure? Blood pressure and heart disease  High blood pressure causes heart disease and increases the risk of stroke. This is more likely to develop in people who have high blood pressure readings, are of African descent, or are overweight.  Have your blood pressure checked: ? Every 3-5 years if you are 18-39 years of age. ? Every year if you are 40 years old or older. Diabetes Have regular diabetes screenings. This checks your fasting blood sugar level. Have the screening done:  Once every three years after age 40 if you are at a normal weight and have a low risk for diabetes.  More often and at a younger age if you are overweight or have a high risk for diabetes. What should I know about preventing infection? Hepatitis B If you have a higher risk for hepatitis B, you should be screened for this virus. Talk with your health care provider to find out if you are at risk for hepatitis B infection. Hepatitis C Testing is recommended for:  Everyone born from 1945 through 1965.  Anyone with known risk factors for hepatitis C. Sexually transmitted infections (STIs)  Get screened for STIs, including gonorrhea and chlamydia, if: ? You are sexually active and are younger than 47 years of age. ? You are older than 47 years of age and your health care provider tells you that you are at risk for this type of infection. ? Your sexual activity has changed since you were last screened, and you are at increased risk for chlamydia or gonorrhea. Ask your health care provider if   you are at risk.  Ask your health care provider about whether you are at high risk for HIV. Your health care provider may recommend a prescription medicine to help prevent HIV infection. If you choose to take medicine to prevent HIV, you should first get tested for HIV. You should then be tested every 3 months for as long as you are taking the medicine. Pregnancy  If you are about to stop having your period (premenopausal) and  you may become pregnant, seek counseling before you get pregnant.  Take 400 to 800 micrograms (mcg) of folic acid every day if you become pregnant.  Ask for birth control (contraception) if you want to prevent pregnancy. Osteoporosis and menopause Osteoporosis is a disease in which the bones lose minerals and strength with aging. This can result in bone fractures. If you are 65 years old or older, or if you are at risk for osteoporosis and fractures, ask your health care provider if you should:  Be screened for bone loss.  Take a calcium or vitamin D supplement to lower your risk of fractures.  Be given hormone replacement therapy (HRT) to treat symptoms of menopause. Follow these instructions at home: Lifestyle  Do not use any products that contain nicotine or tobacco, such as cigarettes, e-cigarettes, and chewing tobacco. If you need help quitting, ask your health care provider.  Do not use street drugs.  Do not share needles.  Ask your health care provider for help if you need support or information about quitting drugs. Alcohol use  Do not drink alcohol if: ? Your health care provider tells you not to drink. ? You are pregnant, may be pregnant, or are planning to become pregnant.  If you drink alcohol: ? Limit how much you use to 0-1 drink a day. ? Limit intake if you are breastfeeding.  Be aware of how much alcohol is in your drink. In the [place] one drink equals one 12 oz bottle of beer (355 mL), one 5 oz glass of wine (148 mL), or one 1 oz glass of hard liquor (44 mL). General instructions  Schedule regular health, dental, and eye exams.  Stay current with your vaccines.  Tell your health care provider if: ? You often feel depressed. ? You have ever been abused or do not feel safe at home. Summary  Adopting a healthy lifestyle and getting preventive care are important in promoting health and wellness.  Follow your health care provider's instructions about healthy  diet, exercising, and getting tested or screened for diseases.  Follow your health care provider's instructions on monitoring your cholesterol and blood pressure. This information is not intended to replace advice given to you by your health care provider. Make sure you discuss any questions you have with your health care provider. Document Revised: 08/17/2018 Document Reviewed: 08/17/2018 Elsevier Patient Education  2020 [employer]  

## 2020-01-29 ENCOUNTER — Inpatient Hospital Stay: Payer: Commercial Managed Care - PPO

## 2020-01-29 ENCOUNTER — Other Ambulatory Visit: Payer: Self-pay

## 2020-01-29 ENCOUNTER — Inpatient Hospital Stay: Payer: Commercial Managed Care - PPO | Attending: Oncology | Admitting: Oncology

## 2020-01-29 ENCOUNTER — Encounter: Payer: Self-pay | Admitting: Oncology

## 2020-01-29 VITALS — BP 110/84 | HR 93 | Temp 99.1°F | Resp 16 | Ht 61.0 in | Wt 174.9 lb

## 2020-01-29 DIAGNOSIS — Z862 Personal history of diseases of the blood and blood-forming organs and certain disorders involving the immune mechanism: Secondary | ICD-10-CM

## 2020-01-29 DIAGNOSIS — D6861 Antiphospholipid syndrome: Secondary | ICD-10-CM | POA: Diagnosis present

## 2020-01-29 DIAGNOSIS — Z7901 Long term (current) use of anticoagulants: Secondary | ICD-10-CM | POA: Diagnosis not present

## 2020-01-29 DIAGNOSIS — Z86718 Personal history of other venous thrombosis and embolism: Secondary | ICD-10-CM

## 2020-01-29 LAB — CBC WITH DIFFERENTIAL/PLATELET
Abs Immature Granulocytes: 0.02 10*3/uL (ref 0.00–0.07)
Basophils Absolute: 0 10*3/uL (ref 0.0–0.1)
Basophils Relative: 0 %
Eosinophils Absolute: 0.1 10*3/uL (ref 0.0–0.5)
Eosinophils Relative: 1 %
HCT: 37.4 % (ref 36.0–46.0)
Hemoglobin: 12.7 g/dL (ref 12.0–15.0)
Immature Granulocytes: 0 %
Lymphocytes Relative: 23 %
Lymphs Abs: 2.1 10*3/uL (ref 0.7–4.0)
MCH: 30.9 pg (ref 26.0–34.0)
MCHC: 34 g/dL (ref 30.0–36.0)
MCV: 91 fL (ref 80.0–100.0)
Monocytes Absolute: 0.5 10*3/uL (ref 0.1–1.0)
Monocytes Relative: 6 %
Neutro Abs: 6.4 10*3/uL (ref 1.7–7.7)
Neutrophils Relative %: 70 %
Platelets: 267 10*3/uL (ref 150–400)
RBC: 4.11 MIL/uL (ref 3.87–5.11)
RDW: 12.4 % (ref 11.5–15.5)
WBC: 9 10*3/uL (ref 4.0–10.5)
nRBC: 0 % (ref 0.0–0.2)

## 2020-01-29 LAB — PROTIME-INR
INR: 1 (ref 0.8–1.2)
Prothrombin Time: 12.7 seconds (ref 11.4–15.2)

## 2020-01-29 LAB — APTT: aPTT: 27 seconds (ref 24–36)

## 2020-01-29 NOTE — Progress Notes (Signed)
Pt has lots of chronic issues-neurological wise and per pt she states she has had all her life-bad menstrual cycles with lots of blood clots. Lots of pain with her complex regional pain syndrome. She is suppose to see a doctor to test for MS.

## 2020-01-30 LAB — LUPUS ANTICOAGULANT
DRVVT: 31.8 s (ref 0.0–47.0)
PTT Lupus Anticoagulant: 27.2 s (ref 0.0–51.9)
Thrombin Time: 15.3 s (ref 0.0–23.0)
dPT Confirm Ratio: 1.1 Ratio (ref 0.00–1.40)
dPT: 31.9 s (ref 0.0–55.0)

## 2020-01-30 LAB — BETA-2-GLYCOPROTEIN I ABS, IGG/M/A
Beta-2 Glyco I IgG: <9 GPI IgG units (ref 0–20)
Beta-2-Glycoprotein I IgA: <9 GPI IgA units (ref 0–25)
Beta-2-Glycoprotein I IgM: <9 GPI IgM units (ref 0–32)

## 2020-01-30 LAB — CARDIOLIPIN ANTIBODIES, IGM+IGG
Anticardiolipin IgG: <9 GPL U/mL (ref 0–14)
Anticardiolipin IgM: <9 MPL U/mL (ref 0–12)

## 2020-02-01 ENCOUNTER — Encounter: Payer: Self-pay | Admitting: Oncology
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
## 2020-02-13 ENCOUNTER — Inpatient Hospital Stay: Payer: Commercial Managed Care - PPO | Attending: Oncology | Admitting: Oncology

## 2020-02-13 DIAGNOSIS — Z87891 Personal history of nicotine dependence: Secondary | ICD-10-CM | POA: Diagnosis not present

## 2020-02-13 DIAGNOSIS — Z801 Family history of malignant neoplasm of trachea, bronchus and lung: Secondary | ICD-10-CM | POA: Diagnosis not present

## 2020-02-13 DIAGNOSIS — K8681 Exocrine pancreatic insufficiency: Secondary | ICD-10-CM | POA: Diagnosis not present

## 2020-02-13 DIAGNOSIS — R109 Unspecified abdominal pain: Secondary | ICD-10-CM

## 2020-02-13 DIAGNOSIS — Z86718 Personal history of other venous thrombosis and embolism: Secondary | ICD-10-CM | POA: Diagnosis not present

## 2020-02-13 DIAGNOSIS — Z79899 Other long term (current) drug therapy: Secondary | ICD-10-CM

## 2020-02-13 DIAGNOSIS — M79604 Pain in right leg: Secondary | ICD-10-CM
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
## 2020-02-22 ENCOUNTER — Ambulatory Visit
Admission: RE | Admit: 2020-02-22 | Discharge: 2020-02-22 | Disposition: A | Discharge status: Home / Self Care | Payer: Commercial Managed Care - PPO | Source: Ambulatory Visit | Attending: Neurology | Admitting: Neurology

## 2020-02-22 ENCOUNTER — Other Ambulatory Visit: Payer: Self-pay

## 2020-02-22 DIAGNOSIS — G259 Extrapyramidal and movement disorder, unspecified: Secondary | ICD-10-CM

## 2020-02-22 MED ORDER — GADOBENATE DIMEGLUMINE 529 MG/ML IV SOLN
16.0000 mL | Freq: Once | INTRAVENOUS | Status: AC | PRN
  Administered 2020-02-22: 16 mL via INTRAVENOUS

## 2020-03-06 ENCOUNTER — Telehealth: Payer: Self-pay | Admitting: Physician Assistant

## 2020-03-06 NOTE — Telephone Encounter (Signed)
The form says she has to attach SSA/SSI forms to it. If she has this, she should bring it. If not I will fax it.

## 2020-03-06 NOTE — Telephone Encounter (Signed)
Patient advised. She states she contacted the Neurologist and he said patient's PCP would have to complete the forms. The forms are for [facility] so she can get her debt cleared.

## 2020-03-06 NOTE — Telephone Encounter (Signed)
Patient dropped off forms to fill out. These forms regard disability. As this refers to her complex regional pain syndrome, she needs to have her neurologist complete this form.

## 2020-03-08 ENCOUNTER — Telehealth: Payer: Self-pay

## 2020-03-08 NOTE — Telephone Encounter (Signed)
Tired to call pt no answer. Pt had dropped off application form to be completed. The form is ready to be faxed but it is missing a copy of pt's SSA/SSI statement. We would be happy to fax the application for pt if she would drop off copy of the required statement. Otherwise, pt can pick up application from office and she can attach statement with application and send. When pt returns call please advise what option pt would like. Thanks TNP

## 2020-03-13 NOTE — Telephone Encounter (Addendum)
Pt will send her son Michele Estrada to pick up the application form. Pt has SSA/SSI statement

## 2020-03-13 NOTE — Telephone Encounter (Signed)
Forms given to pt's son as requested. Front desk verified pt's son as Leander Rams per drivers license.

## 2020-04-29 DIAGNOSIS — R200 Anesthesia of skin: Secondary | ICD-10-CM | POA: Insufficient documentation

## 2020-04-29 DIAGNOSIS — G259 Extrapyramidal and movement disorder, unspecified: Secondary | ICD-10-CM | POA: Insufficient documentation

## 2020-04-29 DIAGNOSIS — R296 Repeated falls: Secondary | ICD-10-CM | POA: Insufficient documentation

## 2020-06-24 ENCOUNTER — Other Ambulatory Visit: Payer: Self-pay

## 2020-06-24 ENCOUNTER — Emergency Department
Admission: EM | Admit: 2020-06-24 | Discharge: 2020-06-24 | Disposition: A | Discharge status: Home / Self Care | Payer: Commercial Managed Care - PPO | Attending: Emergency Medicine | Admitting: Emergency Medicine

## 2020-06-24 ENCOUNTER — Encounter: Payer: Self-pay | Admitting: Emergency Medicine

## 2020-06-24 ENCOUNTER — Emergency Department: Payer: Commercial Managed Care - PPO

## 2020-06-24 DIAGNOSIS — Z87891 Personal history of nicotine dependence: Secondary | ICD-10-CM | POA: Insufficient documentation

## 2020-06-24 DIAGNOSIS — G894 Chronic pain syndrome: Secondary | ICD-10-CM | POA: Diagnosis not present

## 2020-06-24 DIAGNOSIS — R1084 Generalized abdominal pain: Secondary | ICD-10-CM | POA: Insufficient documentation

## 2020-06-24 LAB — COMPREHENSIVE METABOLIC PANEL
ALT: 16 U/L (ref 0–44)
AST: 17 U/L (ref 15–41)
Albumin: 4.2 g/dL (ref 3.5–5.0)
Alkaline Phosphatase: 31 U/L — ABNORMAL LOW (ref 38–126)
Anion gap: 9 (ref 5–15)
BUN: 10 mg/dL (ref 6–20)
CO2: 21 mmol/L — ABNORMAL LOW (ref 22–32)
Calcium: 8.9 mg/dL (ref 8.9–10.3)
Chloride: 107 mmol/L (ref 98–111)
Creatinine, Ser: 0.86 mg/dL (ref 0.44–1.00)
GFR, Estimated: >60 mL/min (ref >60)
Glucose, Bld: 90 mg/dL (ref 70–99)
Potassium: 3.5 mmol/L (ref 3.5–5.1)
Sodium: 137 mmol/L (ref 135–145)
Total Bilirubin: 0.7 mg/dL (ref 0.3–1.2)
Total Protein: 7.5 g/dL (ref 6.5–8.1)

## 2020-06-24 LAB — CBC
HCT: 40.4 % (ref 36.0–46.0)
Hemoglobin: 13.4 g/dL (ref 12.0–15.0)
MCH: 30.6 pg (ref 26.0–34.0)
MCHC: 33.2 g/dL (ref 30.0–36.0)
MCV: 92.2 fL (ref 80.0–100.0)
Platelets: 270 10*3/uL (ref 150–400)
RBC: 4.38 MIL/uL (ref 3.87–5.11)
RDW: 12.9 % (ref 11.5–15.5)
WBC: 9.5 10*3/uL (ref 4.0–10.5)
nRBC: 0.2 % (ref 0.0–0.2)

## 2020-06-24 LAB — LIPASE, BLOOD: Lipase: 27 U/L (ref 11–51)

## 2020-06-24 MED ORDER — IOHEXOL 300 MG/ML  SOLN
100.0000 mL | Freq: Once | INTRAMUSCULAR | Status: AC | PRN
  Administered 2020-06-24: 100 mL via INTRAVENOUS

## 2020-06-24 MED ORDER — DROPERIDOL 2.5 MG/ML IJ SOLN
2.5000 mg | Freq: Once | INTRAMUSCULAR | Status: AC
  Administered 2020-06-24: 2.5 mg via INTRAVENOUS
  Filled 2020-06-24: qty 2

## 2020-06-24 MED ORDER — KETOROLAC TROMETHAMINE 30 MG/ML IJ SOLN
15.0000 mg | Freq: Once | INTRAMUSCULAR | Status: AC
  Administered 2020-06-24: 15 mg via INTRAVENOUS
  Filled 2020-06-24: qty 1

## 2020-06-24 MED ORDER — LACTATED RINGERS IV BOLUS
1000.0000 mL | Freq: Once | INTRAVENOUS | Status: AC
  Administered 2020-06-24: 1000 mL via INTRAVENOUS
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
## 2020-06-24 NOTE — Discharge Instructions (Signed)
Please follow-up with your neurologist.  Return to the ED with any acutely worsening symptoms.  You were noted to have an incidentally found 1 cm cyst in your left ovary.

## 2020-06-24 NOTE — ED Triage Notes (Signed)
C/O flair of CRPS to abdomen x 2-3 days.  AAOx3.  Skin warm and dry. NAD

## 2020-07-16 ENCOUNTER — Other Ambulatory Visit: Payer: Self-pay | Admitting: Physician Assistant

## 2020-07-16 DIAGNOSIS — R112 Nausea with vomiting, unspecified: Secondary | ICD-10-CM

## 2020-07-16 NOTE — Telephone Encounter (Signed)
Requested medication (s) are due for refill today: yes  Requested medication (s) are on the active medication list: yes  Last refill:  12/15/19  Future visit scheduled: no  Notes to clinic:  med not delegated to NT to RF   Requested Prescriptions  Pending Prescriptions Disp Refills   ondansetron (ZOFRAN-ODT) 4 MG disintegrating tablet [Pharmacy Med Name: ONDANSETRON 4 MG ODT] 20 tablet 0    Sig: TAKE 1 TABLET BY MOUTH EVERY 8 HOURS AS NEEDED FOR NAUSEA OR VOMITING.      Not Delegated - Gastroenterology: Antiemetics Failed - 07/16/2020  3:47 PM      Failed - This refill cannot be delegated      Passed - Valid encounter within last 6 months    Recent Outpatient Visits           5 months ago Annual physical exam   [facility] Osvaldo Angst M, PA-C   7 months ago Nausea and vomiting, intractability of vomiting not specified, unspecified vomiting type   [facility] Osvaldo Angst M, [place]   9 months ago Paresthesias   [facility] [place], [place], [place]   1 year ago Non-traumatic compartment syndrome of right lower extremity   [facility] Osvaldo Angst M, [place]   2 years ago Depression, unspecified depression type   [facility] [place], [place], [place]

## 2020-10-07 ENCOUNTER — Encounter: Payer: Self-pay | Admitting: Pain Medicine

## 2020-10-07 ENCOUNTER — Ambulatory Visit: Payer: Commercial Managed Care - PPO | Attending: Pain Medicine | Admitting: Pain Medicine

## 2020-10-07 ENCOUNTER — Other Ambulatory Visit: Payer: Self-pay

## 2020-10-07 VITALS — BP 108/71 | HR 102 | Temp 97.2°F | Resp 16 | Ht 61.0 in | Wt 175.0 lb

## 2020-10-07 DIAGNOSIS — Z86718 Personal history of other venous thrombosis and embolism: Secondary | ICD-10-CM | POA: Insufficient documentation

## 2020-10-07 DIAGNOSIS — G90521 Complex regional pain syndrome I of right lower limb: Secondary | ICD-10-CM | POA: Insufficient documentation

## 2020-10-07 DIAGNOSIS — Z789 Other specified health status: Secondary | ICD-10-CM | POA: Insufficient documentation

## 2020-10-07 DIAGNOSIS — G8929 Other chronic pain: Secondary | ICD-10-CM

## 2020-10-07 DIAGNOSIS — M899 Disorder of bone, unspecified: Secondary | ICD-10-CM | POA: Insufficient documentation

## 2020-10-07 DIAGNOSIS — G894 Chronic pain syndrome: Secondary | ICD-10-CM | POA: Diagnosis not present

## 2020-10-07 DIAGNOSIS — M79601 Pain in right arm: Secondary | ICD-10-CM

## 2020-10-07 DIAGNOSIS — R52 Pain, unspecified: Secondary | ICD-10-CM | POA: Diagnosis not present

## 2020-10-07 DIAGNOSIS — Z862 Personal history of diseases of the blood and blood-forming organs and certain disorders involving the immune mechanism: Secondary | ICD-10-CM | POA: Insufficient documentation

## 2020-10-07 DIAGNOSIS — M79604 Pain in right leg: Secondary | ICD-10-CM | POA: Diagnosis not present

## 2020-10-07 DIAGNOSIS — R569 Unspecified convulsions: Secondary | ICD-10-CM | POA: Diagnosis not present

## 2020-10-07 DIAGNOSIS — Z79899 Other long term (current) drug therapy: Secondary | ICD-10-CM | POA: Insufficient documentation
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
## 2020-10-07 NOTE — Progress Notes (Signed)
Safety precautions to be maintained throughout the outpatient stay will include: orient to surroundings, keep bed in low position, maintain call bell within reach at all times, provide assistance with transfer out of bed and ambulation.  

## 2020-12-02 ENCOUNTER — Other Ambulatory Visit: Payer: Self-pay | Admitting: Physician Assistant

## 2020-12-02 DIAGNOSIS — R112 Nausea with vomiting, unspecified: Secondary | ICD-10-CM

## 2020-12-02 NOTE — Telephone Encounter (Signed)
Requested medication (s) are due for refill today:no  Last refill: 07/17/2020  Future visit scheduled: no  Notes to clinic:   This refill cannot be delegated  Requested Prescriptions  Pending Prescriptions Disp Refills   ondansetron (ZOFRAN-ODT) 4 MG disintegrating tablet [Pharmacy Med Name: ONDANSETRON 4 MG ODT] 20 tablet 0    Sig: TAKE 1 TABLET BY MOUTH EVERY 8 HOURS AS NEEDED FOR NAUSEA OR VOMITING.      Not Delegated - Gastroenterology: Antiemetics Failed - 12/02/2020  9:03 AM      Failed - This refill cannot be delegated      Failed - Valid encounter within last 6 months    Recent Outpatient Visits           10 months ago Annual physical exam   [facility] Osvaldo Angst M, PA-C   11 months ago Nausea and vomiting, intractability of vomiting not specified, unspecified vomiting type   [facility] [place], Lavella Hammock, [place]   1 year ago Paresthesias   [facility] Osvaldo Angst M, [place]   1 year ago Non-traumatic compartment syndrome of right lower extremity   [facility] Osvaldo Angst M, [place]   2 years ago Depression, unspecified depression type   [facility] [place], [place], [place]

## 2021-01-24 ENCOUNTER — Other Ambulatory Visit: Payer: Self-pay | Admitting: Family Medicine

## 2021-01-24 DIAGNOSIS — R112 Nausea with vomiting, unspecified: Secondary | ICD-10-CM

## 2021-02-18 ENCOUNTER — Encounter: Payer: Self-pay | Admitting: Family Medicine

## 2021-02-18 ENCOUNTER — Other Ambulatory Visit: Payer: Self-pay

## 2021-02-18 ENCOUNTER — Ambulatory Visit (INDEPENDENT_AMBULATORY_CARE_PROVIDER_SITE_OTHER): Payer: Commercial Managed Care - PPO | Admitting: Family Medicine

## 2021-02-18 VITALS — BP 113/79 | HR 89 | Temp 98.3°F | Resp 16 | Ht 61.0 in | Wt 184.0 lb

## 2021-02-18 DIAGNOSIS — K8689 Other specified diseases of pancreas: Secondary | ICD-10-CM

## 2021-02-18 DIAGNOSIS — G90521 Complex regional pain syndrome I of right lower limb: Secondary | ICD-10-CM

## 2021-02-18 DIAGNOSIS — R569 Unspecified convulsions: Secondary | ICD-10-CM | POA: Diagnosis not present

## 2021-02-18 DIAGNOSIS — R112 Nausea with vomiting, unspecified: Secondary | ICD-10-CM | POA: Diagnosis not present
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
## 2021-02-19 ENCOUNTER — Encounter: Payer: Self-pay | Admitting: *Deleted

## 2021-03-05 ENCOUNTER — Other Ambulatory Visit: Payer: Self-pay | Admitting: Family Medicine

## 2021-03-05 DIAGNOSIS — R112 Nausea with vomiting, unspecified: Secondary | ICD-10-CM

## 2021-03-05 NOTE — Telephone Encounter (Signed)
Requested medication (s) are due for refill today:  yes   Requested medication (s) are on the active medication list: yes   Last refill:  02/17/2021  Future visit scheduled:  yes   Notes to clinic:  this refill cannot be delegated    Requested Prescriptions  Pending Prescriptions Disp Refills   ondansetron (ZOFRAN-ODT) 4 MG disintegrating tablet [Pharmacy Med Name: ONDANSETRON 4 MG ODT] 20 tablet 2    Sig: TAKE 1 TABLET BY MOUTH EVERY 8 HOURS AS NEEDED FOR NAUSEA OR VOMITING.      Not Delegated - Gastroenterology: Antiemetics Failed - 03/05/2021  9:50 AM      Failed - This refill cannot be delegated      Passed - Valid encounter within last 6 months    Recent Outpatient Visits           2 weeks ago Nausea and vomiting, intractability of vomiting not specified, unspecified vomiting type   [facility] Chrismon, Jodell Cipro, PA-C   1 year ago Annual physical exam   [facility] Osvaldo Angst M, PA-C   1 year ago Nausea and vomiting, intractability of vomiting not specified, unspecified vomiting type   [facility] [place], Lavella Hammock, [place]   1 year ago Paresthesias   [facility] Osvaldo Angst M, [place]   1 year ago Non-traumatic compartment syndrome of right lower extremity   [facility] Trey Sailors, [place]       Future Appointments             In 3 weeks Bacigalupo, Marzella Schlein, MD [facility]

## 2021-03-27 ENCOUNTER — Ambulatory Visit (INDEPENDENT_AMBULATORY_CARE_PROVIDER_SITE_OTHER): Payer: Commercial Managed Care - PPO | Admitting: Family Medicine

## 2021-03-27 ENCOUNTER — Other Ambulatory Visit: Payer: Self-pay

## 2021-03-27 ENCOUNTER — Encounter: Payer: Self-pay | Admitting: Family Medicine

## 2021-03-27 VITALS — BP 127/88 | HR 92 | Temp 98.1°F | Ht 61.0 in | Wt 184.0 lb

## 2021-03-27 DIAGNOSIS — G9050 Complex regional pain syndrome I, unspecified: Secondary | ICD-10-CM | POA: Diagnosis not present

## 2021-03-27 DIAGNOSIS — K3184 Gastroparesis: Secondary | ICD-10-CM | POA: Diagnosis not present

## 2021-03-27 DIAGNOSIS — K8681 Exocrine pancreatic insufficiency: Secondary | ICD-10-CM

## 2021-03-27 MED ORDER — TOPIRAMATE 50 MG PO TABS: 50.0000 mg | ORAL_TABLET | Freq: Two times a day (BID) | ORAL | 2 refills | Status: DC

## 2021-03-27 NOTE — Assessment & Plan Note (Signed)
Longstanding Managed by GI Not on meds currently Will discuss with GI

## 2021-03-27 NOTE — Assessment & Plan Note (Signed)
Failed Zenpep and Creon per patient  Upcoming appt with GI

## 2021-03-27 NOTE — Assessment & Plan Note (Signed)
Not currently on medications No longer seeing Neuro Would like to try Topamax again because it was helpful in the past - will resume at 50mg  BID and likely need to increase in the future (previously on 400mg  daily)
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
## 2021-04-07 ENCOUNTER — Other Ambulatory Visit: Payer: Self-pay

## 2021-04-07 ENCOUNTER — Ambulatory Visit (INDEPENDENT_AMBULATORY_CARE_PROVIDER_SITE_OTHER): Payer: Commercial Managed Care - PPO | Admitting: Gastroenterology

## 2021-04-07 ENCOUNTER — Telehealth: Payer: Self-pay

## 2021-04-07 VITALS — BP 119/80 | HR 81 | Temp 98.8°F | Ht 61.0 in | Wt 183.2 lb

## 2021-04-07 DIAGNOSIS — K8681 Exocrine pancreatic insufficiency: Secondary | ICD-10-CM | POA: Diagnosis not present

## 2021-04-07 DIAGNOSIS — R109 Unspecified abdominal pain: Secondary | ICD-10-CM

## 2021-04-07 DIAGNOSIS — R197 Diarrhea, unspecified: Secondary | ICD-10-CM | POA: Diagnosis not present

## 2021-04-07 DIAGNOSIS — R112 Nausea with vomiting, unspecified: Secondary | ICD-10-CM

## 2021-04-07 NOTE — Patient Instructions (Signed)
I will call you this afternoon with appointment for the Gastric Emptying Study that will be at [facility] Entrance

## 2021-04-07 NOTE — Telephone Encounter (Signed)
Gastric emptying study scheduled at [facility] on September 9th, arrive at 8:30 AM to register for 9:00 AM appointment.  Cannot have anything to eat or drink 6 hours prior to procedure   Left detailed msg on VM per HIPAA  Msg sent via mychart

## 2021-04-07 NOTE — Progress Notes (Addendum)
Michele BouillonVarnita Prestin Estrada [address]  [address]  [place], [place] [address]  Main: [phone]  Fax: [phone]   Gastroenterology Consultation  Referring Provider:     Maryella ShiversPollak, Adriana M, PA-C Primary Care Physician:  Maryella ShiversPollak, Adriana M, PA-C Reason for Consultation:     Nausea vomiting        HPI:    Chief complaint: Nausea vomiting  Michele Estrada is a 50 y.o. y/o female referred for consultation & management  by Dr. Jodi MarblePollak, Lavella HammockAdriana M, PA-C.  Patient with history of chronic regional pain syndrome, referred for nausea vomiting.  Patient reports chronic history of the symptoms dating back a few years, with intermittent flares.  States when the referral was requested, she was having more frequent symptoms, but as of the last few days to week, symptoms have improved, but she continues to have intermittent symptoms consisting of abdominal pain, nausea and vomiting.  She was previously told she has EPI and has been on Creon and Zenpep in the past.  Zenpep samples were given to her by her PCP and she states she stopped taking it as she was vomiting.  She states she was also told that she has gastroparesis but never had an official gastric emptying study.  She attributes a lot of her symptoms to chronic regional pain syndrome and states that the pain had eventually moved to her "gut" and she started having abdominal symptoms.  Describes abdominal pain is diffuse, dull, 5 or 10, nonradiating, intermittent.  Present with or without meals.  No specific aggravating or relieving factors.  Denies any dysphagia.  No blood in stool.  Reports abdominal bloating and intermittent loose stools as well  Previous GI records from digestive health services in [place] available and were personally reviewed.  She was last seen by them in May 2021 for similar symptoms, with abdominal bloating and nausea.  She had been taking Creon at the time.  Creon was discontinued due to little improvement in her  symptoms with it.  MiraLAX titrated to effect versus single bottle of magnesium citrate was recommended.  Levbid nightly and Levsin as needed were recommended.  She was seen by Dr. Kinnie ScalesMedoff in March 2021 and as per the clinic note, patient had an endoscopy and colonoscopy on November 06, 2019 and endoscopy was normal with extensive biopsies of esophagus, stomach, and duodenum without findings.  Colonoscopy was also normal.  The procedure or pathology reports itself are not available to me.  As per his note the only lab that was abnormal was low pancreatic elastase.  Alternative Cymbalta and Topamax were also being considered.  Patient states Topamax did not do well with her.    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    Migraines    Scoliosis    Seizure (HCC)    Type II CRPS (complex regional pain syndrome)     Past Surgical History:  Procedure Laterality Date   CHOLECYSTECTOMY     LAPAROSCOPIC ASSISTED VAGINAL HYSTERECTOMY Bilateral 03/22/2014   Procedure: LAPAROSCOPIC ASSISTED VAGINAL HYSTERECTOMY, BILATERAL SALPINGECTOMY;  Surgeon: Levi AlandMark E Anderson, MD;  Location: [facility];  Service: Gynecology;  Laterality: Bilateral;   SHOULDER SURGERY     stimulator removed   2019  TUBAL LIGATION     uterine ablation     VAGINAL HYSTERECTOMY     WRIST SURGERY Left     Prior to Admission medications   Medication Sig Start Date End Date Taking? Authorizing Provider  diphenhydrAMINE (BENADRYL) 25 MG tablet Take 25 mg by mouth every 6 (six) hours as needed.   Yes [provider]  ondansetron (ZOFRAN-ODT) 4 MG disintegrating tablet TAKE 1 TABLET BY MOUTH EVERY 8 HOURS AS NEEDED FOR NAUSEA OR VOMITING. 03/06/21  Yes Chrismon, Jodell Cipro, PA-C  topiramate (TOPAMAX) 50 MG tablet Take 1 tablet (50  mg total) by mouth 2 (two) times daily. 03/27/21  Yes Bacigalupo, Marzella Schlein, MD    Family History  Problem Relation Age of Onset   Diabetes Mother    Hypertension Mother    Diabetes Father    Hypertension Father    Migraines Sister    Endometriosis Sister    Migraines Brother    Diabetes Paternal Grandmother    Lung cancer Maternal Grandmother    Lung cancer Maternal Grandfather      Social History   Tobacco Use   Smoking status: Former    Packs/day: 0.80    Years: 17.00    Pack years: 13.60    Types: Cigarettes    Quit date: 10/07/2016    Years since quitting: 4.5   Smokeless tobacco: Never  Vaping Use   Vaping Use: Never used  Substance Use Topics   Alcohol use: Not Currently    Alcohol/week: 1.0 standard drink    Types: 1 Standard drinks or equivalent per week   Drug use: Never    Allergies as of 04/07/2021 - Review Complete 04/07/2021  Allergen Reaction Noted   Prozac [fluoxetine hcl] Other (See Comments) 05/13/2012   Estrogens Other (See Comments) 05/13/2012   Gabapentin Other (See Comments) 07/25/2012   Iron Hives 05/13/2012   Pregabalin Anxiety and Other (See Comments) 09/17/2017    Review of Systems:    All systems reviewed and negative except where noted in HPI.   Physical Exam:  Constitutional: General:   Alert,  Well-developed, well-nourished, pleasant and cooperative in NAD BP 119/80   Pulse 81   Temp 98.8 F (37.1 C) (Oral)   Ht 5\' 1"  (1.549 m)   Wt 183 lb 3.2 oz (83.1 kg)   BMI 34.62 kg/m   Eyes:  Sclera clear, no icterus.   Conjunctiva pink. PERRLA  Ears:  No scars, lesions or masses, Normal auditory acuity. Nose:  No deformity, discharge, or lesions. Mouth:  No deformity or lesions, oropharynx pink & moist.  Neck:  Supple; no masses or thyromegaly.  Respiratory: Normal respiratory effort, Normal percussion  Gastrointestinal:  No bruits.  Soft, non-tender and non-distended without masses, hepatosplenomegaly or hernias noted.  No  guarding or rebound tenderness.     Cardiac: No clubbing or edema.  No cyanosis. Normal posterior tibial pedal pulses noted.  Lymphatic:  No significant cervical or axillary adenopathy.  Psych:  Alert and cooperative. Normal mood and affect.  Musculoskeletal:  Normal gait. Head normocephalic, atraumatic. Symmetrical without gross deformities. 5/5 Upper and Lower extremity strength bilaterally.  Skin: Warm. Intact without significant lesions or rashes. No jaundice.  Neurologic:  Face symmetrical, tongue midline, Normal sensation to touch;  grossly normal neurologically.  Psych:  Alert and oriented x3, Alert and cooperative. Normal mood and affect.   Labs: CBC    Component Value Date/Time   WBC 9.5 06/24/2020 1434   RBC 4.38 06/24/2020 1434  HGB 13.4 06/24/2020 1434   HGB 13.0 01/22/2020 1452   HCT 40.4 06/24/2020 1434   HCT 37.2 01/22/2020 1452   PLT 270 06/24/2020 1434   PLT 292 01/22/2020 1452   MCV 92.2 06/24/2020 1434   MCV 91 01/22/2020 1452   MCV 91 06/06/2013 1715   MCH 30.6 06/24/2020 1434   MCHC 33.2 06/24/2020 1434   RDW 12.9 06/24/2020 1434   RDW 11.6 (L) 01/22/2020 1452   RDW 12.3 06/06/2013 1715   LYMPHSABS 2.1 01/29/2020 1122   LYMPHSABS 2.1 01/22/2020 1452   MONOABS 0.5 01/29/2020 1122   EOSABS 0.1 01/29/2020 1122   EOSABS 0.1 01/22/2020 1452   BASOSABS 0.0 01/29/2020 1122   BASOSABS 0.1 01/22/2020 1452   CMP     Component Value Date/Time   NA 137 06/24/2020 1434   NA 138 01/22/2020 1452   NA 140 06/06/2013 1715   K 3.5 06/24/2020 1434   K 3.5 06/06/2013 1715   CL 107 06/24/2020 1434   CL 111 (H) 06/06/2013 1715   CO2 21 (L) 06/24/2020 1434   CO2 25 06/06/2013 1715   GLUCOSE 90 06/24/2020 1434   GLUCOSE 88 06/06/2013 1715   BUN 10 06/24/2020 1434   BUN 9 01/22/2020 1452   BUN 12 06/06/2013 1715   CREATININE 0.86 06/24/2020 1434   CREATININE 1.13 06/06/2013 1715   CREATININE 0.92 04/04/2013 1721   CALCIUM 8.9 06/24/2020 1434   CALCIUM  8.8 06/06/2013 1715   PROT 7.5 06/24/2020 1434   PROT 6.8 01/22/2020 1452   PROT 7.5 06/06/2013 1715   ALBUMIN 4.2 06/24/2020 1434   ALBUMIN 4.5 01/22/2020 1452   ALBUMIN 4.2 06/06/2013 1715   AST 17 06/24/2020 1434   AST 24 06/06/2013 1715   ALT 16 06/24/2020 1434   ALT 23 06/06/2013 1715   ALKPHOS 31 (L) 06/24/2020 1434   ALKPHOS 47 (L) 06/06/2013 1715   BILITOT 0.7 06/24/2020 1434   BILITOT 0.2 01/22/2020 1452   BILITOT 0.3 06/06/2013 1715   GFRNONAA >60 06/24/2020 1434   GFRNONAA >60 06/06/2013 1715   GFRAA 82 01/22/2020 1452   GFRAA >60 06/06/2013 1715   TTG IgA - February 2021 Fecal calprotectin normal thyroid 2021 Fecal lactoferrin - February 2021 Fecal pancreatic elastase 147 Feb 2021  Imaging Studies: CT abdomen pelvis October 2021 with no focal abnormalities identified for acute abdominal pain  Assessment and Plan:   Michele Estrada is a 50 y.o. y/o female has been referred for abdominal pain, bloating, nausea vomiting, intermittent loose stools  Patient has history of pancreatic insufficiency with symptoms poorly controlled at this time.  Patient has not been on any pancreatic replacement enzymes consistently recently.  States samples were given by her primary care provider in the office and she stopped taking them about a week ago.  We will check pancreatic elastase to evaluate if it has improved from before as it was low at 147 in February 2021. If fecal pancreatic elastase is low, will need to consider pancreatic replacement enzymes  Patient has had chronic history of abdominal pain, bloating, nausea and vomiting, intermittent loose stools, for at least a few years, with previous work-up and evaluation by Dr. Kinnie Scales and digestive health services team with records reviewed and summarized as per HPI.  Given possibility of gastroparesis was being considered in the past, however will obtain gastric emptying study at this time to see if it is present  Given her  abdominal pain we will also check H. pylori  breath test  No indication for EGD and colonoscopy at this time given that procedures were just done last year for evaluation of the same symptoms, and repeat procedures at this time unlikely to change management.  In addition, imaging with CT abdomen pelvis in October 2021 was otherwise reassuring, with reassuring CBC and CMP in October 2021 as well  Previous celiac testing, fecal calprotectin levels have also been normal as listed above in labs  May also need to consider SIBO testing in the future.  Question if with her history of chronic regional pain syndrome in her requiring pain medications intermittently in the past if SIBO is a possibility  Can consider IBS Delene Ruffini or antispasmodics in the future as well  Patient likely has a component of functional symptoms as well  Dr Michele Estrada  Speech recognition software was used to dictate the above note.

## 2021-04-07 NOTE — Addendum Note (Signed)
Addended by: Melodie Bouillon on: 04/07/2021 02:08 PM   Modules accepted: Orders

## 2021-04-08 NOTE — Telephone Encounter (Signed)
Last read by Bradi Clarnce Flock at 4:46 PM on 04/07/2021

## 2021-04-09 LAB — H. PYLORI BREATH TEST: H pylori Breath Test: NEGATIVE

## 2021-04-10 LAB — PANCREATIC ELASTASE, FECAL: Pancreatic Elastase, Fecal: 343 ug Elast./g (ref >200)

## 2021-04-16 ENCOUNTER — Ambulatory Visit: Payer: Medicare Other | Admitting: Gastroenterology

## 2021-05-16 ENCOUNTER — Ambulatory Visit: Payer: Commercial Managed Care - PPO

## 2021-05-23 ENCOUNTER — Ambulatory Visit
Admission: RE | Admit: 2021-05-23 | Discharge: 2021-05-23 | Disposition: A | Discharge status: Home / Self Care | Payer: Commercial Managed Care - PPO | Source: Ambulatory Visit | Attending: Gastroenterology | Admitting: Gastroenterology

## 2021-05-23 ENCOUNTER — Other Ambulatory Visit: Payer: Self-pay

## 2021-05-23 DIAGNOSIS — R112 Nausea with vomiting, unspecified: Secondary | ICD-10-CM | POA: Diagnosis present

## 2021-05-23 MED ORDER — TECHNETIUM TC 99M SULFUR COLLOID
2.0000 | Freq: Once | INTRAVENOUS | Status: AC | PRN
  Administered 2021-05-23: 2.83 via INTRAVENOUS

## 2021-05-30 ENCOUNTER — Encounter: Payer: Self-pay | Admitting: Family Medicine

## 2021-06-10 ENCOUNTER — Ambulatory Visit: Payer: Commercial Managed Care - PPO | Admitting: Gastroenterology

## 2021-06-13 ENCOUNTER — Ambulatory Visit: Payer: Self-pay | Admitting: Family Medicine

## 2021-06-17 ENCOUNTER — Other Ambulatory Visit: Payer: Self-pay | Admitting: Family Medicine

## 2021-06-18 NOTE — Telephone Encounter (Signed)
Requested medications are due for refill today.  yes  Requested medications are on the active medications list.  yes  Last refill. 03/27/2021  Future visit scheduled.   yes  Notes to clinic.  Medication not delegated. 

## 2021-06-30 ENCOUNTER — Encounter: Payer: Self-pay | Admitting: Family Medicine

## 2021-06-30 ENCOUNTER — Ambulatory Visit (INDEPENDENT_AMBULATORY_CARE_PROVIDER_SITE_OTHER): Payer: Commercial Managed Care - PPO | Admitting: Family Medicine

## 2021-06-30 ENCOUNTER — Other Ambulatory Visit: Payer: Self-pay

## 2021-06-30 VITALS — BP 104/79 | HR 85 | Temp 97.5°F | Ht 61.0 in | Wt 181.3 lb

## 2021-06-30 DIAGNOSIS — K3184 Gastroparesis: Secondary | ICD-10-CM

## 2021-06-30 DIAGNOSIS — K8681 Exocrine pancreatic insufficiency: Secondary | ICD-10-CM

## 2021-06-30 DIAGNOSIS — G9050 Complex regional pain syndrome I, unspecified: Secondary | ICD-10-CM

## 2021-06-30 DIAGNOSIS — G894 Chronic pain syndrome: Secondary | ICD-10-CM | POA: Diagnosis not present

## 2021-06-30 MED ORDER — TOPIRAMATE 200 MG PO TABS: 200.0000 mg | ORAL_TABLET | Freq: Every day | ORAL | 1 refills | Status: DC

## 2021-06-30 NOTE — Assessment & Plan Note (Signed)
Saw GI Doing much better since using pulse electromagnetic device (OSKA)

## 2021-06-30 NOTE — Assessment & Plan Note (Addendum)
No longer seeing pain management or Neuro Failed cymbalta, gabapentin, and depakote in the class Doing better on topamax but will increase to 200mg  daily

## 2021-06-30 NOTE — Progress Notes (Signed)
Established patient visit   Patient: Michele Estrada   DOB: 1971/08/13   50 y.o. Female  MRN: [id number] Visit Date: 06/30/2021  Today's healthcare provider: Shirlee Latch, MD   Chief Complaint  Patient presents with   Follow-up    Subjective    HPI  Patient reports feeling good. States she hates sitting in certain positions due to it hurting. She would rate her pain level today at a 4/10. Patient reports using OSKA 5/6 times a day or a hour and a half each session. Has been using since June/July and it reset her vagus nerves and labs have gotten better.     Medications: Outpatient Medications Prior to Visit  Medication Sig   diphenhydrAMINE (BENADRYL) 25 MG tablet Take 25 mg by mouth every 6 (six) hours as needed.   ELDERBERRY PO Take by mouth every morning.   ondansetron (ZOFRAN-ODT) 4 MG disintegrating tablet TAKE 1 TABLET BY MOUTH EVERY 8 HOURS AS NEEDED FOR NAUSEA OR VOMITING.   TURMERIC PO Take 50 mg by mouth at bedtime.   [DISCONTINUED] topiramate (TOPAMAX) 50 MG tablet TAKE 1 TABLET BY MOUTH TWICE A DAY   No facility-administered medications prior to visit.    Review of Systems  Constitutional:  Positive for fatigue.  Respiratory: Negative.    Cardiovascular: Negative.   Musculoskeletal:  Positive for myalgias.   Last CBC Lab Results  Component Value Date   WBC 9.5 06/24/2020   HGB 13.4 06/24/2020   HCT 40.4 06/24/2020   MCV 92.2 06/24/2020   MCH 30.6 06/24/2020   RDW 12.9 06/24/2020   PLT 270 06/24/2020   Last metabolic panel Lab Results  Component Value Date   GLUCOSE 90 06/24/2020   NA 137 06/24/2020   K 3.5 06/24/2020   CL 107 06/24/2020   CO2 21 (L) 06/24/2020   BUN 10 06/24/2020   CREATININE 0.86 06/24/2020   GFRNONAA >60 06/24/2020   CALCIUM 8.9 06/24/2020   PHOS 3.2 09/09/2016   PROT 7.5 06/24/2020   ALBUMIN 4.2 06/24/2020   LABGLOB 2.3 01/22/2020   AGRATIO 2.0 01/22/2020   BILITOT 0.7 06/24/2020   ALKPHOS 31 (L)  06/24/2020   AST 17 06/24/2020   ALT 16 06/24/2020   ANIONGAP 9 06/24/2020   Last lipids Lab Results  Component Value Date   CHOL 227 (H) 01/22/2020   HDL 68 01/22/2020   LDLCALC 143 (H) 01/22/2020   TRIG 91 01/22/2020   CHOLHDL 3.3 01/22/2020   Last hemoglobin A1c No results found for: HGBA1C Last thyroid functions Lab Results  Component Value Date   TSH 1.840 01/22/2020   Last vitamin D No results found for: 25OHVITD2, 25OHVITD3, VD25OH Last vitamin B12 and Folate Lab Results  Component Value Date   VITAMINB12 890 06/22/2019       Objective    Temp (!) 97.5 F (36.4 C) (Temporal)   Ht 5\' 1"  (1.549 m)   Wt 181 lb 4.8 oz (82.2 kg)   BMI 34.26 kg/m  BP Readings from Last 3 Encounters:  04/07/21 119/80  03/27/21 127/88  02/18/21 113/79   Wt Readings from Last 3 Encounters:  06/30/21 181 lb 4.8 oz (82.2 kg)  04/07/21 183 lb 3.2 oz (83.1 kg)  03/27/21 184 lb (83.5 kg)      Physical Exam Vitals reviewed.  Constitutional:      General: She is not in acute distress.    Appearance: Normal appearance. She is well-developed. She is not diaphoretic.  HENT:     Head: Normocephalic and atraumatic.  Eyes:     General: No scleral icterus.    Conjunctiva/sclera: Conjunctivae normal.  Neck:     Thyroid: No thyromegaly.  Cardiovascular:     Rate and Rhythm: Normal rate and regular rhythm.     Pulses: Normal pulses.     Heart sounds: Normal heart sounds. No murmur heard. Pulmonary:     Effort: Pulmonary effort is normal. No respiratory distress.     Breath sounds: Normal breath sounds. No wheezing, rhonchi or rales.  Musculoskeletal:     Cervical back: Neck supple.     Right lower leg: No edema.     Left lower leg: No edema.  Lymphadenopathy:     Cervical: No cervical adenopathy.  Skin:    General: Skin is warm and dry.     Findings: No rash.  Neurological:     Mental Status: She is alert and oriented to person, place, and time. Mental status is at baseline.   Psychiatric:        Mood and Affect: Mood normal.        Behavior: Behavior normal.      No results found for any visits on 06/30/21.  Assessment & Plan     Problem List Items Addressed This Visit       Digestive   Exocrine pancreatic insufficiency    Saw GI Doing much better since using pulse electromagnetic device (OSKA)      Gastroparesis    F/b GI Improving with OSKA device        Nervous and Auditory   Complex regional pain syndrome I - Primary    No longer seeing pain management or Neuro Failed cymbalta, gabapentin, and depakote in the class Doing better on topamax but will increase to 200mg  daily      Relevant Medications   topiramate (TOPAMAX) 200 MG tablet     Other   Chronic pain syndrome (Chronic)    Continue topamax as above Has failed multiple options Per pain management, opioid meds not recommended for sympathetically mediated pain      Relevant Medications   topiramate (TOPAMAX) 200 MG tablet     Return in about 3 months (around 09/30/2021) for chronic disease f/u, With new PCP.      I, 10/02/2021, MD, have reviewed all documentation for this visit. The documentation on 06/30/21 for the exam, diagnosis, procedures, and orders are all accurate and complete.   Deo Mehringer, 07/02/21, MD, MPH [facility]

## 2021-06-30 NOTE — Assessment & Plan Note (Signed)
F/b GI Improving with OSKA device

## 2021-06-30 NOTE — Assessment & Plan Note (Signed)
Continue topamax as above Has failed multiple options Per pain management, opioid meds not recommended for sympathetically mediated pain

## 2021-09-24 ENCOUNTER — Other Ambulatory Visit: Payer: Self-pay | Admitting: Family Medicine

## 2021-09-24 NOTE — Telephone Encounter (Signed)
Requested medication (s) are due for refill today - no  Requested medication (s) are on the active medication list -yes  Future visit scheduled -yes  Last refill: 06/30/21 #90 1RF  Notes to clinic: Request RF: non delegated Rx, should have RF  Requested Prescriptions  Pending Prescriptions Disp Refills   topiramate (TOPAMAX) 200 MG tablet [Pharmacy Med Name: TOPIRAMATE 200 MG TAB] 90 tablet 1    Sig: TAKE 1 TABLET BY MOUTH AT BEDTIME     Not Delegated - Neurology: Anticonvulsants - topiramate & zonisamide Failed - 09/24/2021 11:55 AM      Failed - This refill cannot be delegated      Failed - Cr in normal range and within 360 days    Creatinine  Date Value Ref Range Status  06/06/2013 1.13 0.60 - 1.30 mg/dL Final   Creat  Date Value Ref Range Status  04/04/2013 0.92 0.50 - 1.10 mg/dL Final   Creatinine, Ser  Date Value Ref Range Status  06/24/2020 0.86 0.44 - 1.00 mg/dL Final          Failed - CO2 in normal range and within 360 days    CO2  Date Value Ref Range Status  06/24/2020 21 (L) 22 - 32 mmol/L Final   Co2  Date Value Ref Range Status  06/06/2013 25 21 - 32 mmol/L Final          Passed - Valid encounter within last 12 months    Recent Outpatient Visits           2 months ago Complex regional pain syndrome type 1, affecting unspecified site   [facility], Marzella Schlein, MD   6 months ago Complex regional pain syndrome type 1, affecting unspecified site   [facility], Marzella Schlein, MD   7 months ago Nausea and vomiting, intractability of vomiting not specified, unspecified vomiting type   [facility] Chrismon, Jodell Cipro, PA-C   1 year ago Annual physical exam   [facility] Osvaldo Angst M, PA-C   1 year ago Nausea and vomiting, intractability of vomiting not specified, unspecified vomiting type   [facility], Ricki Rodriguez M, [place]       Future Appointments              In 1 week Drubel, Lillia Abed, PA-C [facility]               Requested Prescriptions  Pending Prescriptions Disp Refills   topiramate (TOPAMAX) 200 MG tablet [Pharmacy Med Name: TOPIRAMATE 200 MG TAB] 90 tablet 1    Sig: TAKE 1 TABLET BY MOUTH AT BEDTIME     Not Delegated - Neurology: Anticonvulsants - topiramate & zonisamide Failed - 09/24/2021 11:55 AM      Failed - This refill cannot be delegated      Failed - Cr in normal range and within 360 days    Creatinine  Date Value Ref Range Status  06/06/2013 1.13 0.60 - 1.30 mg/dL Final   Creat  Date Value Ref Range Status  04/04/2013 0.92 0.50 - 1.10 mg/dL Final   Creatinine, Ser  Date Value Ref Range Status  06/24/2020 0.86 0.44 - 1.00 mg/dL Final          Failed - CO2 in normal range and within 360 days    CO2  Date Value Ref Range Status  06/24/2020 21 (L) 22 - 32 mmol/L Final   Co2  Date Value Ref  Range Status  06/06/2013 25 21 - 32 mmol/L Final          Passed - Valid encounter within last 12 months    Recent Outpatient Visits           2 months ago Complex regional pain syndrome type 1, affecting unspecified site   [facility], Marzella Schlein, MD   6 months ago Complex regional pain syndrome type 1, affecting unspecified site   [facility], Marzella Schlein, MD   7 months ago Nausea and vomiting, intractability of vomiting not specified, unspecified vomiting type   [facility] Chrismon, Jodell Cipro, PA-C   1 year ago Annual physical exam   [facility] Osvaldo Angst M, [place]   1 year ago Nausea and vomiting, intractability of vomiting not specified, unspecified vomiting type   [facility], Lavella Hammock, [place]       Future Appointments             In 1 week Alfredia Ferguson, PA-C [facility]

## 2021-10-06 ENCOUNTER — Other Ambulatory Visit: Payer: Self-pay

## 2021-10-06 ENCOUNTER — Encounter: Payer: Self-pay | Admitting: Physician Assistant

## 2021-10-06 ENCOUNTER — Ambulatory Visit (INDEPENDENT_AMBULATORY_CARE_PROVIDER_SITE_OTHER): Payer: Commercial Managed Care - PPO | Admitting: Physician Assistant

## 2021-10-06 VITALS — BP 114/81 | HR 89 | Temp 98.7°F | Ht 61.0 in | Wt 180.1 lb

## 2021-10-06 DIAGNOSIS — M940 Chondrocostal junction syndrome [Tietze]: Secondary | ICD-10-CM

## 2021-10-06 DIAGNOSIS — Z1159 Encounter for screening for other viral diseases: Secondary | ICD-10-CM | POA: Diagnosis not present

## 2021-10-06 DIAGNOSIS — G9050 Complex regional pain syndrome I, unspecified: Secondary | ICD-10-CM

## 2021-10-06 DIAGNOSIS — Z6834 Body mass index (BMI) 34.0-34.9, adult: Secondary | ICD-10-CM | POA: Diagnosis not present

## 2021-10-06 NOTE — Progress Notes (Signed)
I,Sha'taria Tyson,acting as a Neurosurgeon for [employer], PA-C.,have documented all relevant documentation on the behalf of Alfredia Ferguson, PA-C,as directed by  Alfredia Ferguson, PA-C while in the presence of Alfredia Ferguson, PA-C.  Established patient visit   Patient: Michele Estrada   DOB: 10-15-1970   51 y.o. Female  MRN: [id number] Visit Date: 10/06/2021  Today's healthcare provider: Alfredia Ferguson, PA-C   Chief Complaint  Patient presents with   Follow-up   Subjective    HPI   Pt reports increase in chostochonditris due to recent COVID infection. Used to tolerate walking short and long distances, now flares of chronic pain w/ short distances. Has been using OSKA device on her chest with some relief.  Last visit her topamax was increased to 200 mg, she states historically she has taken 400 mg without side effects and feels she wants to get back to this dose. Feels overall well on 200 mg. Denies mood changes, fatigue, brain fog.   Also is concerned about her weight gain in the last 2-3 years.    Medications: Outpatient Medications Prior to Visit  Medication Sig   Ascorbic Acid (VITAMIN C) 1000 MG tablet Take 1,000 mg by mouth daily. Take 2 tablets qd   diphenhydrAMINE (BENADRYL) 25 MG tablet Take 25 mg by mouth every 6 (six) hours as needed.   ondansetron (ZOFRAN-ODT) 4 MG disintegrating tablet TAKE 1 TABLET BY MOUTH EVERY 8 HOURS AS NEEDED FOR NAUSEA OR VOMITING.   topiramate (TOPAMAX) 200 MG tablet Take 1 tablet (200 mg total) by mouth at bedtime.   TURMERIC PO Take 50 mg by mouth at bedtime.   [DISCONTINUED] ELDERBERRY PO Take by mouth every morning.   No facility-administered medications prior to visit.   Review of Systems  Constitutional:  Negative for fatigue and fever.  Respiratory:  Negative for cough and shortness of breath.   Cardiovascular:  Positive for chest pain. Negative for leg swelling.  Neurological:  Negative for dizziness and headaches.        Objective    Blood pressure 114/81, pulse 89, temperature 98.7 F (37.1 C), temperature source Oral, height 5\' 1"  (1.549 m), weight 180 lb 1.6 oz (81.7 kg), SpO2 100 %.   Physical Exam Constitutional:      General: She is awake.     Appearance: She is well-developed.  HENT:     Head: Normocephalic.  Eyes:     Conjunctiva/sclera: Conjunctivae normal.  Cardiovascular:     Rate and Rhythm: Normal rate and regular rhythm.     Heart sounds: Normal heart sounds.  Pulmonary:     Effort: Pulmonary effort is normal.     Breath sounds: Normal breath sounds.  Skin:    General: Skin is warm.  Neurological:     Mental Status: She is alert and oriented to person, place, and time.  Psychiatric:        Attention and Perception: Attention normal.        Mood and Affect: Mood normal.        Speech: Speech normal.        Behavior: Behavior is cooperative.     No results found for any visits on 10/06/21.  Assessment & Plan     Problem List Items Addressed This Visit       Nervous and Auditory   Complex regional pain syndrome I - Primary    Feels well on topamax 200 mg but does want to continue to increase to 400mg .  As this is off-label, will discuss w/ Dr. B to get an idea of her treatment plan.         Musculoskeletal and Integument   Costal chondritis    Discussed antiinflammatory methods, pt does not take NSAIDs, not able to use a bathtub. She started taking tumeric, suggested CBD/CBD cream If not improved we can try a low dose steroid for a few days        Other   BMI 34.0-34.9,adult    Pt would like annual bw completed today, ordered A1c, lipid, cmp, cbc      Relevant Orders   Comprehensive Metabolic Panel (CMET)   CBC w/Diff/Platelet   Lipid Profile   HgB A1c   Other Visit Diagnoses     Encounter for hepatitis C screening test for low risk patient       Relevant Orders   Hepatitis C antibody        Return in about 4 months (around 02/03/2022) for CPE.       I, Alfredia Ferguson, PA-C have reviewed all documentation for this visit. The documentation on  10/06/2021 for the exam, diagnosis, procedures, and orders are all accurate and complete.    Alfredia Ferguson, PA-C  [facility] [phone] (phone) [phone] (fax)  [facility]

## 2021-10-06 NOTE — Assessment & Plan Note (Signed)
Discussed antiinflammatory methods, pt does not take NSAIDs, not able to use a bathtub. She started taking tumeric, suggested CBD/CBD cream If not improved we can try a low dose steroid for a few days

## 2021-10-06 NOTE — Assessment & Plan Note (Signed)
Pt would like annual bw completed today, ordered A1c, lipid, cmp, cbc

## 2021-10-06 NOTE — Assessment & Plan Note (Signed)
Feels well on topamax 200 mg but does want to continue to increase to 400mg . As this is off-label, will discuss w/ Dr. B to get an idea of her treatment plan.

## 2021-10-07 ENCOUNTER — Encounter: Payer: Self-pay | Admitting: Physician Assistant

## 2021-10-07 LAB — CBC WITH DIFFERENTIAL/PLATELET
Basophils Absolute: 0 10*3/uL (ref 0.0–0.2)
Basos: 1 %
EOS (ABSOLUTE): 0 10*3/uL (ref 0.0–0.4)
Eos: 1 %
Hematocrit: 38.1 % (ref 34.0–46.6)
Hemoglobin: 12.7 g/dL (ref 11.1–15.9)
Immature Grans (Abs): 0 10*3/uL (ref 0.0–0.1)
Immature Granulocytes: 0 %
Lymphocytes Absolute: 2.3 10*3/uL (ref 0.7–3.1)
Lymphs: 33 %
MCH: 30.5 pg (ref 26.6–33.0)
MCHC: 33.3 g/dL (ref 31.5–35.7)
MCV: 92 fL (ref 79–97)
Monocytes Absolute: 0.5 10*3/uL (ref 0.1–0.9)
Monocytes: 8 %
Neutrophils Absolute: 4.1 10*3/uL (ref 1.4–7.0)
Neutrophils: 57 %
Platelets: 216 10*3/uL (ref 150–450)
RBC: 4.16 x10E6/uL (ref 3.77–5.28)
RDW: 11.7 % (ref 11.7–15.4)
WBC: 7 10*3/uL (ref 3.4–10.8)

## 2021-10-07 LAB — COMPREHENSIVE METABOLIC PANEL
ALT: 16 IU/L (ref 0–32)
AST: 16 IU/L (ref 0–40)
Albumin/Globulin Ratio: 2.1 (ref 1.2–2.2)
Albumin: 4.6 g/dL (ref 3.8–4.8)
Alkaline Phosphatase: 39 IU/L — ABNORMAL LOW (ref 44–121)
BUN/Creatinine Ratio: 8 — ABNORMAL LOW (ref 9–23)
BUN: 8 mg/dL (ref 6–24)
Bilirubin Total: <0.2 mg/dL (ref 0.0–1.2)
CO2: 18 mmol/L — ABNORMAL LOW (ref 20–29)
Calcium: 9 mg/dL (ref 8.7–10.2)
Chloride: 111 mmol/L — ABNORMAL HIGH (ref 96–106)
Creatinine, Ser: 0.99 mg/dL (ref 0.57–1.00)
Globulin, Total: 2.2 g/dL (ref 1.5–4.5)
Glucose: 86 mg/dL (ref 70–99)
Potassium: 4 mmol/L (ref 3.5–5.2)
Sodium: 141 mmol/L (ref 134–144)
Total Protein: 6.8 g/dL (ref 6.0–8.5)
eGFR: 70 mL/min/{1.73_m2} (ref >59)

## 2021-10-07 LAB — LIPID PANEL
Chol/HDL Ratio: 3.9 ratio (ref 0.0–4.4)
Cholesterol, Total: 169 mg/dL (ref 100–199)
HDL: 43 mg/dL (ref >39)
LDL Chol Calc (NIH): 110 mg/dL — ABNORMAL HIGH (ref 0–99)
Triglycerides: 86 mg/dL (ref 0–149)
VLDL Cholesterol Cal: 16 mg/dL (ref 5–40)

## 2021-10-07 LAB — HEMOGLOBIN A1C
Est. average glucose Bld gHb Est-mCnc: 103 mg/dL
Hgb A1c MFr Bld: 5.2 % (ref 4.8–5.6)

## 2021-10-07 LAB — HEPATITIS C ANTIBODY: Hep C Virus Ab: <0.1 s/co ratio (ref 0.0–0.9)

## 2021-10-08 ENCOUNTER — Other Ambulatory Visit: Payer: Self-pay | Admitting: Physician Assistant

## 2021-10-08 MED ORDER — TOPIRAMATE 200 MG PO TABS: 300.0000 mg | ORAL_TABLET | Freq: Every day | ORAL | 1 refills | Status: DC

## 2021-11-19 ENCOUNTER — Encounter: Payer: Self-pay | Admitting: Emergency Medicine

## 2021-11-19 ENCOUNTER — Ambulatory Visit
Admission: EM | Admit: 2021-11-19 | Discharge: 2021-11-19 | Disposition: A | Discharge status: Home / Self Care | Payer: Commercial Managed Care - PPO | Attending: Emergency Medicine | Admitting: Emergency Medicine

## 2021-11-19 ENCOUNTER — Other Ambulatory Visit: Payer: Self-pay

## 2021-11-19 DIAGNOSIS — H1031 Unspecified acute conjunctivitis, right eye: Secondary | ICD-10-CM

## 2021-11-19 MED ORDER — POLYMYXIN B-TRIMETHOPRIM 10000-0.1 UNIT/ML-% OP SOLN: 1.0000 [drp] | Freq: Four times a day (QID) | OPHTHALMIC | 0 refills | Status: AC

## 2021-11-19 NOTE — ED Triage Notes (Signed)
Pt presents with right eye irritation. She has tried to flush her eye but that has not helped. ?
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
## 2021-11-19 NOTE — Discharge Instructions (Addendum)
Use the antibiotic eyedrops as prescribed.    Follow-up with your eye doctor for a recheck in 1 to 2 days if your symptoms are not improving.    Go to the emergency department if you have acute eye pain, changes in your vision, or other concerning symptoms.    

## 2021-11-24 ENCOUNTER — Other Ambulatory Visit: Payer: Self-pay | Admitting: Physician Assistant

## 2021-11-24 ENCOUNTER — Telehealth: Payer: Self-pay | Admitting: Physician Assistant

## 2021-11-24 DIAGNOSIS — G9050 Complex regional pain syndrome I, unspecified: Secondary | ICD-10-CM

## 2021-11-24 MED ORDER — TOPIRAMATE 200 MG PO TABS: 400.0000 mg | ORAL_TABLET | Freq: Every day | ORAL | 1 refills | Status: DC

## 2021-11-24 NOTE — Telephone Encounter (Signed)
Pt wants a refill on her topiramate (TOPAMAX) 200 MG tablet/ but she stated she was advised during her last appt that the dose was suppose to increase to 400MG  / please advise  ? ?Pt has 8 days of pills left and wanted to let office know before she ran out /  ? ?[facility] - [place] - [address] Phone:  [phone]  ?Fax:  [phone]  ?  ? ?

## 2021-12-01 ENCOUNTER — Telehealth: Payer: Self-pay | Admitting: Physician Assistant

## 2021-12-01 NOTE — Telephone Encounter (Signed)
[facility] faxed refill request for the following medications: ? ?ondansetron (ZOFRAN-ODT) 4 MG disintegrating tablet  ? ?Please advise. ? ?

## 2021-12-02 ENCOUNTER — Other Ambulatory Visit: Payer: Self-pay | Admitting: Physician Assistant

## 2021-12-02 DIAGNOSIS — R112 Nausea with vomiting, unspecified: Secondary | ICD-10-CM

## 2021-12-02 MED ORDER — ONDANSETRON 4 MG PO TBDP: 4.0000 mg | ORAL_TABLET | Freq: Three times a day (TID) | ORAL | 2 refills | Status: DC | PRN

## 2022-01-26 ENCOUNTER — Encounter: Payer: Self-pay | Admitting: Physician Assistant

## 2022-01-26 ENCOUNTER — Ambulatory Visit (INDEPENDENT_AMBULATORY_CARE_PROVIDER_SITE_OTHER): Payer: Commercial Managed Care - PPO | Admitting: Physician Assistant

## 2022-01-26 VITALS — BP 119/92 | HR 91 | Ht 61.0 in | Wt 178.7 lb

## 2022-01-26 DIAGNOSIS — Z0000 Encounter for general adult medical examination without abnormal findings: Secondary | ICD-10-CM | POA: Diagnosis not present

## 2022-01-26 DIAGNOSIS — M940 Chondrocostal junction syndrome [Tietze]: Secondary | ICD-10-CM

## 2022-01-26 DIAGNOSIS — R5383 Other fatigue: Secondary | ICD-10-CM | POA: Diagnosis not present

## 2022-01-26 DIAGNOSIS — G9050 Complex regional pain syndrome I, unspecified: Secondary | ICD-10-CM

## 2022-01-26 NOTE — Progress Notes (Signed)
I,Michele Estrada,acting as a Neurosurgeonscribe for Eastman KodakLindsay Lodie Waheed, PA-C.,have documented all relevant documentation on the behalf of Michele FergusonLindsay Garth Diffley, PA-C,as directed by  Michele FergusonLindsay Michele Ates, PA-C while in the presence of Michele FergusonLindsay Michele Quang, PA-C.   Complete physical exam   Patient: Michele Estrada Way   DOB: 06-25-1971   51 y.o. Female  MRN: [id number] Visit Date: 01/26/2022  Today's healthcare provider: Alfredia FergusonLindsay Shauniece Kwan, PA-C   Cc. cpe  Subjective    Michele Estrada is a 51 y.o. female who presents today for a complete physical exam.  She reports consuming a general diet.  The patient reports walking daily  She generally feels well. She reports sleeping well. She does not have additional problems to discuss today.   Reports continued worsening of fatigue and pain since December. Feels little improvement only stability.  Reports still feels unable to lose weight despite increased dose of topamax--but unable to exercise more than short walks due to significant fatigue and pain.  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    Migraines    Scoliosis    Seizure (HCC)    Type II CRPS (complex regional pain syndrome)    Past Surgical History:  Procedure Laterality Date   CHOLECYSTECTOMY     LAPAROSCOPIC ASSISTED VAGINAL HYSTERECTOMY Bilateral 03/22/2014   Procedure: LAPAROSCOPIC ASSISTED VAGINAL HYSTERECTOMY, BILATERAL SALPINGECTOMY;  Surgeon: Levi AlandMark E Anderson, MD;  Location: [facility];  Service: Gynecology;  Laterality: Bilateral;   SHOULDER SURGERY     stimulator removed   2019   TUBAL LIGATION     uterine ablation     VAGINAL HYSTERECTOMY     WRIST SURGERY Left    Social History   Socioeconomic History   Marital status: Married    Spouse name: Tasia CatchingsCraig   Number of children: 1   Years of  education: Bachelors   Highest education level: Not on file  Occupational History   Occupation: Psychologist, counsellingBilling Manager    Employer: [employer]    Comment: [employer]  Tobacco Use   Smoking status: Former    Packs/day: 0.80    Years: 17.00    Pack years: 13.60    Types: Cigarettes    Quit date: 10/07/2016    Years since quitting: 5.3   Smokeless tobacco: Never  Vaping Use   Vaping Use: Never used  Substance and Sexual Activity   Alcohol use: Not Currently    Alcohol/week: 1.0 standard drink    Types: 1 Standard drinks or equivalent per week   Drug use: Never   Sexual activity: Yes    Partners: Male    Comment: long term monagamous relationship  Other Topics Concern   Not on file  Social History Narrative   Patient lives at home at home with her husband Tasia Catchings(Craig) and son.   Patient works for [employer] and [employer].   Right handed   Caffeine One cup of coffee daily.      Social Determinants of Health   Financial Resource Strain: Not on file  Food Insecurity: Not on file  Transportation Needs: Not on file  Physical Activity: Not on file  Stress: Not on file  Social Connections: Not on file  Intimate Partner Violence: Not on file   Family  Status  Relation Name Status   Mother  Alive   Father  Alive   Sister  Alive   Brother  Alive   PGM  Deceased   MGM  Deceased   MGF  Deceased   Family History  Problem Relation Age of Onset   Diabetes Mother    Hypertension Mother    Diabetes Father    Hypertension Father    Migraines Sister    Endometriosis Sister    Migraines Brother    Diabetes Paternal Grandmother    Lung cancer Maternal Grandmother    Lung cancer Maternal Grandfather    Allergies  Allergen Reactions   Prozac [Fluoxetine Hcl] Other (See Comments)    High dose caused seizures.   Estrogens Other (See Comments)    DVT on BCPs   Gabapentin Other (See Comments)    May have made moody and maybe increased migraine frequency. "May have made  moody and maybe increased migraine frequency" May have made moody and maybe increased migraine frequency. May have made moody and maybe increased migraine frequency.   Iron Hives   Pregabalin Anxiety and Other (See Comments)    Migraines Migraines Migraines Migraines  Migraines  Migraines Migraines Migraines Migraines    Patient Care Team: Michele Ferguson, PA-C as PCP - General (Physician Assistant) Eldred Manges, MD as Consulting Physician (Orthopedic Surgery) Schermerhorn, Ihor Austin, MD as Referring Physician (Obstetrics and Gynecology)   Medications: Outpatient Medications Prior to Visit  Medication Sig   Ascorbic Acid (VITAMIN C) 1000 MG tablet Take 1,000 mg by mouth daily. Take 2 tablets qd   diphenhydrAMINE (BENADRYL) 25 MG tablet Take 25 mg by mouth every 6 (six) hours as needed.   ondansetron (ZOFRAN-ODT) 4 MG disintegrating tablet Take 1 tablet (4 mg total) by mouth every 8 (eight) hours as needed.   topiramate (TOPAMAX) 200 MG tablet Take 2 tablets (400 mg total) by mouth at bedtime.   TURMERIC PO Take 100 mg by mouth at bedtime.   No facility-administered medications prior to visit.    Review of Systems  Constitutional:  Positive for fatigue. Negative for fever.  Respiratory:  Negative for cough and shortness of breath.   Cardiovascular:  Negative for chest pain and leg swelling.  Gastrointestinal:  Negative for abdominal pain.  Neurological:  Negative for dizziness and headaches.    Objective     BP (!) 119/92 (BP Location: Left Arm, Patient Position: Sitting, Cuff Size: Large)   Pulse 91   Ht 5\' 1"  (1.549 m)   Wt 178 lb 11.2 oz (81.1 kg)   SpO2 99%   BMI 33.77 kg/m    Physical Exam Constitutional:      General: She is awake.     Appearance: She is well-developed. She is not ill-appearing.  HENT:     Head: Normocephalic.     Right Ear: Tympanic membrane normal.     Left Ear: Tympanic membrane normal.     Nose: Nose normal. No congestion or  rhinorrhea.     Mouth/Throat:     Pharynx: No oropharyngeal exudate or posterior oropharyngeal erythema.  Eyes:     Conjunctiva/sclera: Conjunctivae normal.     Pupils: Pupils are equal, round, and reactive to light.  Neck:     Thyroid: No thyroid mass or thyromegaly.  Cardiovascular:     Rate and Rhythm: Normal rate and regular rhythm.     Heart sounds: Normal heart sounds.  Pulmonary:     Effort: Pulmonary effort is  normal.     Breath sounds: Normal breath sounds.  Musculoskeletal:     Right lower leg: No swelling. No edema.     Left lower leg: No swelling. No edema.  Lymphadenopathy:     Cervical: No cervical adenopathy.  Skin:    General: Skin is warm.  Neurological:     Mental Status: She is alert and oriented to person, place, and time.  Psychiatric:        Attention and Perception: Attention normal.        Mood and Affect: Mood normal.        Speech: Speech normal.        Behavior: Behavior normal. Behavior is cooperative.     Last depression screening scores    10/06/2021   10:07 AM 06/30/2021   11:45 AM 02/18/2021    8:42 AM  PHQ 2/9 Scores  PHQ - 2 Score 0 2 2  PHQ- 9 Score 5  9   Last fall risk screening    01/26/2022   10:14 AM  Fall Risk   Falls in the past year? 0  Number falls in past yr: 0  Injury with Fall? 0  Risk for fall due to : No Fall Risks   Last Audit-C alcohol use screening    01/26/2022   10:14 AM  Alcohol Use Disorder Test (AUDIT)  1. How often do you have a drink containing alcohol? 0  2. How many drinks containing alcohol do you have on a typical day when you are drinking? 0  3. How often do you have six or more drinks on one occasion? 0  AUDIT-C Score 0   A score of 3 or more in women, and 4 or more in men indicates increased risk for alcohol abuse, EXCEPT if all of the points are from question 1   No results found for any visits on 01/26/22.  Assessment & Plan    Routine Health Maintenance and Physical Exam  Exercise  Activities and Dietary recommendations --balanced diet high in fiber and protein, low in sugars, carbs, fats. --physical activity/exercise 30 minutes 3-5 times a week     Immunization History  Administered Date(s) Administered   Influenza Split 06/03/2012   Tdap 06/03/2012    Health Maintenance  Topic Date Due   COVID-19 Vaccine (1) Never done   MAMMOGRAM  01/30/2022 (Originally 01/13/2022)   INFLUENZA VACCINE  04/07/2022   TETANUS/TDAP  06/03/2022   COLONOSCOPY (Pts 45-52yrs Insurance coverage will need to be confirmed)  11/05/2029   Hepatitis C Screening  Completed   HIV Screening  Completed   HPV VACCINES  Aged Out    Discussed health benefits of physical activity, and encouraged her to engage in regular exercise appropriate for her age and condition.  Problem List Items Addressed This Visit       Nervous and Auditory   Complex regional pain syndrome type 1 of lower extremity (Right) (Chronic)    Feels little improvement with increased dose of Topamax 400 mg daily. Discussed referrals to specialists, pt found no benefit from neurology in the past;  Recommending referral to rheumatology.        Relevant Orders   Ambulatory referral to Rheumatology   Other Visit Diagnoses     Health maintenance examination    -  Primary       Pt has mammogram scheduled today.  Return in about 6 months (around 07/29/2022) for chronic conditions.     I, Michele Ferguson, PA-C  have reviewed all documentation for this visit. The documentation on  01/26/2022 for the exam, diagnosis, procedures, and orders are all accurate and complete.  Michele Ferguson, PA-C [facility] [address] [place], [place], [address] Office: [phone] Fax: [phone]   [facility]

## 2022-01-26 NOTE — Assessment & Plan Note (Signed)
Feels little improvement with increased dose of Topamax 400 mg daily. Discussed referrals to specialists, pt found no benefit from neurology in the past;  Recommending referral to rheumatology.

## 2022-01-29 ENCOUNTER — Other Ambulatory Visit: Payer: Self-pay | Admitting: Physician Assistant

## 2022-01-29 DIAGNOSIS — M940 Chondrocostal junction syndrome [Tietze]: Secondary | ICD-10-CM

## 2022-01-29 DIAGNOSIS — R5383 Other fatigue: Secondary | ICD-10-CM

## 2022-01-29 DIAGNOSIS — G9050 Complex regional pain syndrome I, unspecified: Secondary | ICD-10-CM

## 2022-02-05 ENCOUNTER — Encounter: Payer: Self-pay | Admitting: Physician Assistant

## 2022-02-11 ENCOUNTER — Other Ambulatory Visit: Payer: Self-pay | Admitting: Physician Assistant

## 2022-02-11 DIAGNOSIS — G894 Chronic pain syndrome: Secondary | ICD-10-CM

## 2022-02-11 DIAGNOSIS — M940 Chondrocostal junction syndrome [Tietze]: Secondary | ICD-10-CM

## 2022-02-11 DIAGNOSIS — R5383 Other fatigue: Secondary | ICD-10-CM

## 2022-02-11 DIAGNOSIS — G9050 Complex regional pain syndrome I, unspecified: Secondary | ICD-10-CM

## 2022-02-17 ENCOUNTER — Other Ambulatory Visit: Payer: Self-pay | Admitting: Physician Assistant

## 2022-02-17 DIAGNOSIS — G9050 Complex regional pain syndrome I, unspecified: Secondary | ICD-10-CM

## 2022-03-12 ENCOUNTER — Encounter: Payer: Self-pay | Admitting: Family Medicine

## 2022-03-12 ENCOUNTER — Ambulatory Visit: Payer: Self-pay

## 2022-03-12 ENCOUNTER — Ambulatory Visit (INDEPENDENT_AMBULATORY_CARE_PROVIDER_SITE_OTHER): Payer: Commercial Managed Care - PPO | Admitting: Family Medicine

## 2022-03-12 DIAGNOSIS — G90523 Complex regional pain syndrome I of lower limb, bilateral: Secondary | ICD-10-CM

## 2022-03-12 MED ORDER — MELOXICAM 15 MG PO TABS: 15.0000 mg | ORAL_TABLET | Freq: Every day | ORAL | 0 refills | Status: DC

## 2022-03-12 MED ORDER — BACLOFEN 20 MG PO TABS: 20.0000 mg | ORAL_TABLET | Freq: Every evening | ORAL | 0 refills | Status: DC | PRN
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
## 2022-03-17 ENCOUNTER — Ambulatory Visit
Admission: RE | Admit: 2022-03-17 | Discharge: 2022-03-17 | Disposition: A | Discharge status: Home / Self Care | Payer: Commercial Managed Care - PPO | Source: Ambulatory Visit | Attending: Family Medicine | Admitting: Family Medicine

## 2022-03-17 ENCOUNTER — Ambulatory Visit
Admission: RE | Admit: 2022-03-17 | Discharge: 2022-03-17 | Disposition: A | Discharge status: Home / Self Care | Payer: Commercial Managed Care - PPO | Attending: Family Medicine | Admitting: Family Medicine

## 2022-03-17 DIAGNOSIS — Y939 Activity, unspecified: Secondary | ICD-10-CM | POA: Insufficient documentation

## 2022-03-17 DIAGNOSIS — G90523 Complex regional pain syndrome I of lower limb, bilateral: Secondary | ICD-10-CM

## 2022-03-19 NOTE — Progress Notes (Signed)
Chronic thoracic, upper spine, changes noted. However, no acute fracture. This is reassuring. Hope you are feeling better.  Take care, Jacky Kindle, FNP  [facility] [address] [place], [place] [address] [phone] (phone) [phone] (fax) [facility]

## 2022-03-19 NOTE — Progress Notes (Signed)
Neck alignment is normal. No swelling noted. Both reassuring.  Take care, Jacky Kindle, FNP  [facility] [address] [place], [place] [address] [phone] (phone) [phone] (fax) [facility]

## 2022-05-13 ENCOUNTER — Other Ambulatory Visit: Payer: Self-pay | Admitting: Physician Assistant

## 2022-05-13 DIAGNOSIS — G9050 Complex regional pain syndrome I, unspecified: Secondary | ICD-10-CM

## 2022-06-01 ENCOUNTER — Emergency Department: Payer: Commercial Managed Care - PPO

## 2022-06-01 ENCOUNTER — Other Ambulatory Visit: Payer: Self-pay

## 2022-06-01 ENCOUNTER — Emergency Department
Admission: EM | Admit: 2022-06-01 | Discharge: 2022-06-01 | Disposition: A | Discharge status: Home / Self Care | Payer: Commercial Managed Care - PPO | Attending: Emergency Medicine | Admitting: Emergency Medicine

## 2022-06-01 ENCOUNTER — Encounter: Payer: Self-pay | Admitting: Emergency Medicine

## 2022-06-01 DIAGNOSIS — N200 Calculus of kidney: Secondary | ICD-10-CM

## 2022-06-01 DIAGNOSIS — R1031 Right lower quadrant pain: Secondary | ICD-10-CM | POA: Diagnosis present

## 2022-06-01 DIAGNOSIS — N132 Hydronephrosis with renal and ureteral calculous obstruction: Secondary | ICD-10-CM | POA: Diagnosis not present

## 2022-06-01 LAB — URINALYSIS, ROUTINE W REFLEX MICROSCOPIC
Bilirubin Urine: NEGATIVE
Glucose, UA: NEGATIVE mg/dL
Ketones, ur: NEGATIVE mg/dL
Leukocytes,Ua: NEGATIVE
Nitrite: NEGATIVE
Protein, ur: 30 mg/dL — AB
Specific Gravity, Urine: >1.03 — ABNORMAL HIGH (ref 1.005–1.030)
pH: 6 (ref 5.0–8.0)

## 2022-06-01 LAB — URINALYSIS, MICROSCOPIC (REFLEX): RBC / HPF: >50 RBC/hpf (ref 0–5)

## 2022-06-01 LAB — COMPREHENSIVE METABOLIC PANEL
ALT: 18 U/L (ref 0–44)
AST: 19 U/L (ref 15–41)
Albumin: 4.2 g/dL (ref 3.5–5.0)
Alkaline Phosphatase: 34 U/L — ABNORMAL LOW (ref 38–126)
Anion gap: 6 (ref 5–15)
BUN: 18 mg/dL (ref 6–20)
CO2: 18 mmol/L — ABNORMAL LOW (ref 22–32)
Calcium: 9.2 mg/dL (ref 8.9–10.3)
Chloride: 116 mmol/L — ABNORMAL HIGH (ref 98–111)
Creatinine, Ser: 1.1 mg/dL — ABNORMAL HIGH (ref 0.44–1.00)
GFR, Estimated: >60 mL/min (ref >60)
Glucose, Bld: 140 mg/dL — ABNORMAL HIGH (ref 70–99)
Potassium: 3.6 mmol/L (ref 3.5–5.1)
Sodium: 140 mmol/L (ref 135–145)
Total Bilirubin: 0.5 mg/dL (ref 0.3–1.2)
Total Protein: 7.6 g/dL (ref 6.5–8.1)

## 2022-06-01 LAB — CBC
HCT: 38.7 % (ref 36.0–46.0)
Hemoglobin: 12.9 g/dL (ref 12.0–15.0)
MCH: 30.9 pg (ref 26.0–34.0)
MCHC: 33.3 g/dL (ref 30.0–36.0)
MCV: 92.6 fL (ref 80.0–100.0)
Platelets: 243 10*3/uL (ref 150–400)
RBC: 4.18 MIL/uL (ref 3.87–5.11)
RDW: 12.5 % (ref 11.5–15.5)
WBC: 14.3 10*3/uL — ABNORMAL HIGH (ref 4.0–10.5)
nRBC: 0 % (ref 0.0–0.2)

## 2022-06-01 LAB — LIPASE, BLOOD: Lipase: 38 U/L (ref 11–51)

## 2022-06-01 LAB — POC URINE PREG, ED: Preg Test, Ur: NEGATIVE

## 2022-06-01 MED ORDER — IOHEXOL 300 MG/ML  SOLN
100.0000 mL | Freq: Once | INTRAMUSCULAR | Status: AC | PRN
  Administered 2022-06-01: 100 mL via INTRAVENOUS

## 2022-06-01 MED ORDER — MORPHINE SULFATE (PF) 4 MG/ML IV SOLN
4.0000 mg | Freq: Once | INTRAVENOUS | Status: AC
  Administered 2022-06-01: 4 mg via INTRAVENOUS
  Filled 2022-06-01: qty 1

## 2022-06-01 MED ORDER — KETOROLAC TROMETHAMINE 15 MG/ML IJ SOLN
15.0000 mg | Freq: Once | INTRAMUSCULAR | Status: AC
Start: 2022-06-01 — End: 2022-06-01
  Administered 2022-06-01: 15 mg via INTRAVENOUS
  Filled 2022-06-01: qty 1

## 2022-06-01 MED ORDER — LACTATED RINGERS IV BOLUS
1000.0000 mL | Freq: Once | INTRAVENOUS | Status: AC
Start: 2022-06-01 — End: 2022-06-01
  Administered 2022-06-01: 1000 mL via INTRAVENOUS

## 2022-06-01 MED ORDER — METOCLOPRAMIDE HCL 5 MG/ML IJ SOLN
10.0000 mg | Freq: Once | INTRAMUSCULAR | Status: AC
  Administered 2022-06-01: 10 mg via INTRAVENOUS
  Filled 2022-06-01: qty 2

## 2022-06-01 MED ORDER — OXYCODONE HCL 5 MG PO TABS: 5.0000 mg | ORAL_TABLET | Freq: Three times a day (TID) | ORAL | 0 refills | Status: AC | PRN

## 2022-06-01 MED ORDER — ONDANSETRON HCL 4 MG/2ML IJ SOLN
4.0000 mg | Freq: Once | INTRAMUSCULAR | Status: AC
  Administered 2022-06-01: 4 mg via INTRAVENOUS
  Filled 2022-06-01: qty 2
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
## 2022-06-01 NOTE — ED Triage Notes (Signed)
Pt via POV from home. Pt c/o RLQ pain and vomiting this AM, starting around 0330 this AM. Pt still has her appendix. Denies urinary symptoms. Pt is A&Ox4 and NAD

## 2022-06-01 NOTE — Discharge Instructions (Signed)
You have a kidney stone on the right side which is the cause of your pain.  Please take 400 mg of ibuprofen every 6 hours.  You can take the oxycodone as needed for breakthrough pain.  Please follow-up with urology.  If you develop fever please return to the emergency department.

## 2022-06-02 LAB — URINE CULTURE: Culture: <10000 — AB

## 2022-06-04 ENCOUNTER — Ambulatory Visit: Payer: Commercial Managed Care - PPO | Admitting: Urology

## 2022-06-17 ENCOUNTER — Ambulatory Visit (INDEPENDENT_AMBULATORY_CARE_PROVIDER_SITE_OTHER): Payer: Commercial Managed Care - PPO | Admitting: Urology

## 2022-06-17 ENCOUNTER — Encounter: Payer: Self-pay | Admitting: Urology

## 2022-06-17 VITALS — BP 140/82 | HR 80 | Ht 61.0 in | Wt 175.0 lb

## 2022-06-17 DIAGNOSIS — N1330 Unspecified hydronephrosis: Secondary | ICD-10-CM | POA: Diagnosis not present

## 2022-06-17 DIAGNOSIS — N200 Calculus of kidney: Secondary | ICD-10-CM

## 2022-06-17 DIAGNOSIS — N132 Hydronephrosis with renal and ureteral calculous obstruction: Secondary | ICD-10-CM

## 2022-06-17 LAB — MICROSCOPIC EXAMINATION

## 2022-06-17 LAB — URINALYSIS, COMPLETE
Bilirubin, UA: NEGATIVE
Glucose, UA: NEGATIVE
Ketones, UA: NEGATIVE
Leukocytes,UA: NEGATIVE
Nitrite, UA: NEGATIVE
Protein,UA: NEGATIVE
RBC, UA: NEGATIVE
Specific Gravity, UA: 1.03 (ref 1.005–1.030)
Urobilinogen, Ur: 0.2 mg/dL (ref 0.2–1.0)
pH, UA: 5 (ref 5.0–7.5)

## 2022-06-17 NOTE — Progress Notes (Signed)
06/17/2022 8:22 AM   Michele Estrada 1971-03-02 [id number]  Referring provider: Alfredia Ferguson, PA-C [address] [place],  [place] [address]  Chief Complaint  Patient presents with   Nephrolithiasis    HPI: Michele Estrada is a 51 y.o. female with complex regional pain syndrome presents in follow-up of a recent ED visit for severe renal colic  ED visit 06/01/2022 with severe right lower quadrant/right groin pain Associated with severe nausea/vomiting CT with ~ 2 mm right UVJ calculus with mild/moderate hydronephrosis/hydroureter Pain subsequently controlled with parenteral analgesics and she was discharged with recommendations of urology follow-up No prior history stone disease Her pain has resolved but she is not aware of passing a stone.  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    Migraines    Scoliosis    Seizure (HCC)    Type II CRPS (complex regional pain syndrome)     Surgical History: Past Surgical History:  Procedure Laterality Date   CHOLECYSTECTOMY     LAPAROSCOPIC ASSISTED VAGINAL HYSTERECTOMY Bilateral 03/22/2014   Procedure: LAPAROSCOPIC ASSISTED VAGINAL HYSTERECTOMY, BILATERAL SALPINGECTOMY;  Surgeon: Levi Aland, MD;  Location: [facility];  Service: Gynecology;  Laterality: Bilateral;   SHOULDER SURGERY     stimulator removed   2019   TUBAL LIGATION     uterine ablation     VAGINAL HYSTERECTOMY     WRIST SURGERY Left     Home Medications:  Allergies as of 06/17/2022       Reactions   Prozac [fluoxetine Hcl] Other (See Comments)   High dose caused seizures.   Estrogens Other (See Comments)   DVT on BCPs   Gabapentin Other (See Comments)   May have made moody and maybe  increased migraine frequency. "May have made moody and maybe increased migraine frequency" May have made moody and maybe increased migraine frequency. May have made moody and maybe increased migraine frequency.   Iron Hives   Pregabalin Anxiety, Other (See Comments)   Migraines Migraines Migraines Migraines Migraines Migraines Migraines Migraines Migraines        Medication List        Accurate as of June 17, 2022  8:22 AM. If you have any questions, ask your nurse or doctor.          STOP taking these medications    baclofen 20 MG tablet Commonly known as: LIORESAL Stopped by: Riki Altes, MD   meloxicam 15 MG tablet Commonly known as: MOBIC Stopped by: Riki Altes, MD   TURMERIC PO Stopped by: Riki Altes, MD   vitamin C 1000 MG tablet Stopped by: Riki Altes, MD   Vitamin D (Ergocalciferol) 1.25 MG (50000 UNIT) Caps capsule Commonly known as: DRISDOL Stopped by: Riki Altes, MD       TAKE these medications    diphenhydrAMINE 25 MG tablet Commonly known as: BENADRYL Take 25 mg by mouth every 6 (six) hours as needed.   ondansetron 4 MG disintegrating tablet Commonly known as: ZOFRAN-ODT Take 1 tablet (4 mg total) by mouth every 8 (eight) hours as needed.   topiramate 200 MG tablet Commonly known as: TOPAMAX TAKE 2 TABLETS BY  MOUTH EVERY NIGHT AT BEDTIME        Allergies:  Allergies  Allergen Reactions   Prozac [Fluoxetine Hcl] Other (See Comments)    High dose caused seizures.   Estrogens Other (See Comments)    DVT on BCPs   Gabapentin Other (See Comments)    May have made moody and maybe increased migraine frequency. "May have made moody and maybe increased migraine frequency" May have made moody and maybe increased migraine frequency. May have made moody and maybe increased migraine frequency.   Iron Hives   Pregabalin Anxiety and Other (See Comments)     Migraines Migraines Migraines Migraines  Migraines  Migraines Migraines Migraines Migraines    Family History: Family History  Problem Relation Age of Onset   Diabetes Mother    Hypertension Mother    Diabetes Father    Hypertension Father    Migraines Sister    Endometriosis Sister    Migraines Brother    Diabetes Paternal Grandmother    Lung cancer Maternal Grandmother    Lung cancer Maternal Grandfather     Social History:  reports that she quit smoking about 5 years ago. Her smoking use included cigarettes. She has a 13.60 pack-year smoking history. She has never used smokeless tobacco. She reports that she does not currently use alcohol after a past usage of about 1.0 standard drink of alcohol per week. She reports that she does not use drugs.   Physical Exam: BP (!) 140/82   Pulse 80   Ht 5\' 1"  (1.549 m)   Wt 175 lb (79.4 kg)   BMI 33.07 kg/m   Constitutional:  Alert and oriented, No acute distress. HEENT: [place] AT Respiratory: Normal respiratory effort, no increased work of breathing. Psychiatric: Normal mood and affect.  Laboratory Data:  Urinalysis Dipstick/microscopy negative   Pertinent Imaging: See images were personally reviewed and interpreted   Assessment & Plan:   History severe renal colic secondary to 2 mm right UVJ calculus Pain has resolved and she has most likely passed the stone. Schedule follow-up renal ultrasound to document resolution/persistence of right hydronephrosis We discussed she has ~ 50% chance of forming another stone within the next 5 years.  General stone prevention guidelines were reviewed and she was provided literature 1 year follow-up with [place], MD  [facility] [address], [place], [place] [address] [phone]

## 2022-06-18 ENCOUNTER — Encounter: Payer: Self-pay | Admitting: Urology

## 2022-07-01 ENCOUNTER — Ambulatory Visit: Payer: Commercial Managed Care - PPO

## 2022-07-01 ENCOUNTER — Ambulatory Visit
Admission: RE | Admit: 2022-07-01 | Discharge: 2022-07-01 | Disposition: A | Discharge status: Home / Self Care | Payer: Commercial Managed Care - PPO | Source: Ambulatory Visit | Attending: Urology | Admitting: Urology

## 2022-07-01 DIAGNOSIS — N132 Hydronephrosis with renal and ureteral calculous obstruction: Secondary | ICD-10-CM | POA: Diagnosis present

## 2022-07-02 ENCOUNTER — Encounter: Payer: Self-pay | Admitting: *Deleted

## 2022-08-04 ENCOUNTER — Other Ambulatory Visit: Payer: Self-pay | Admitting: Physician Assistant

## 2022-08-04 DIAGNOSIS — G9050 Complex regional pain syndrome I, unspecified: Secondary | ICD-10-CM

## 2022-08-27 ENCOUNTER — Other Ambulatory Visit: Payer: Self-pay | Admitting: Physician Assistant

## 2022-08-27 DIAGNOSIS — R112 Nausea with vomiting, unspecified: Secondary | ICD-10-CM
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
## 2022-09-16 NOTE — Progress Notes (Unsigned)
      Established patient visit   Patient: Michele Estrada   DOB: 29-Mar-1971   52 y.o. Female  MRN: [id number] Visit Date: 09/17/2022  Today's healthcare provider: Mikey Kirschner, PA-C   No chief complaint on file.  Subjective    HPI  ***  Medications: Outpatient Medications Prior to Visit  Medication Sig   diphenhydrAMINE (BENADRYL) 25 MG tablet Take 25 mg by mouth every 6 (six) hours as needed.   ondansetron (ZOFRAN-ODT) 4 MG disintegrating tablet DISSOLVE 1 TABLET BY MOUTH EVERY 8 HOURSAS NEEDED.   topiramate (TOPAMAX) 200 MG tablet TAKE 2 TABLETS BY MOUTH EVERY NIGHT AT BEDTIME   No facility-administered medications prior to visit.    Review of Systems  {Labs  Heme  Chem  Endocrine  Serology  Results Review (optional):23779}   Objective    There were no vitals taken for this visit. {Show previous vital signs (optional):23777}  Physical Exam  ***  No results found for any visits on 09/17/22.  Assessment & Plan     ***  No follow-ups on file.      {provider attestation***:1}   Mikey Kirschner, PA-C  [facility] [phone] (phone) [phone] (fax)  [place]

## 2022-09-17 ENCOUNTER — Encounter: Payer: Self-pay | Admitting: Physician Assistant

## 2022-09-17 ENCOUNTER — Ambulatory Visit (INDEPENDENT_AMBULATORY_CARE_PROVIDER_SITE_OTHER): Payer: Commercial Managed Care - PPO | Admitting: Physician Assistant

## 2022-09-17 VITALS — BP 113/56 | HR 88 | Ht 61.0 in | Wt 177.6 lb

## 2022-09-17 DIAGNOSIS — K3184 Gastroparesis: Secondary | ICD-10-CM | POA: Diagnosis not present

## 2022-09-17 DIAGNOSIS — G90521 Complex regional pain syndrome I of right lower limb: Secondary | ICD-10-CM

## 2022-09-17 DIAGNOSIS — R110 Nausea: Secondary | ICD-10-CM | POA: Diagnosis not present

## 2022-09-17 NOTE — Assessment & Plan Note (Signed)
Referred to [facility]

## 2022-09-17 NOTE — Assessment & Plan Note (Signed)
Will start with gastroparesis referral and work on finding a CRPS specialist

## 2022-10-13 ENCOUNTER — Other Ambulatory Visit: Payer: Self-pay | Admitting: Physician Assistant

## 2022-10-13 ENCOUNTER — Ambulatory Visit (INDEPENDENT_AMBULATORY_CARE_PROVIDER_SITE_OTHER): Payer: Commercial Managed Care - PPO | Admitting: Physician Assistant

## 2022-10-13 ENCOUNTER — Encounter: Payer: Self-pay | Admitting: Physician Assistant

## 2022-10-13 VITALS — BP 115/83 | HR 87 | Wt 170.5 lb

## 2022-10-13 DIAGNOSIS — R1084 Generalized abdominal pain: Secondary | ICD-10-CM | POA: Diagnosis not present

## 2022-10-13 DIAGNOSIS — K3184 Gastroparesis: Secondary | ICD-10-CM

## 2022-10-13 DIAGNOSIS — R112 Nausea with vomiting, unspecified: Secondary | ICD-10-CM

## 2022-10-13 MED ORDER — METOCLOPRAMIDE HCL 5 MG PO TABS: 5.0000 mg | ORAL_TABLET | Freq: Three times a day (TID) | ORAL | 1 refills | Status: DC

## 2022-10-13 NOTE — Progress Notes (Signed)
I,Sha'taria Tyson,acting as a Education administrator for [employer], PA-C.,have documented all relevant documentation on the behalf of Mikey Kirschner, PA-C,as directed by  Mikey Kirschner, PA-C while in the presence of Mikey Kirschner, PA-C.   Established patient visit   Patient: Michele Estrada   DOB: 03/11/1971   52 y.o. Female  MRN: [id number] Visit Date: 10/13/2022  Today's healthcare provider: Mikey Kirschner, PA-C   Cc. Nausea, vomiting, abdominal pain  Subjective    HPI  Pt reports continued abdominal pain, nausea, diarrhea from her gastroparesis. The earliest GI appt is 01/13/23. She reports her symptoms are worsening and she has been unable to keep food down. Denies eating today, reports her last meal was some sorbet she was able to keep down last night. Drinking water without issue.  Reports generalized abdominal pain, a burning 'ants crawling under her skin' feeling with an associated mottling or skin color change.   Pt is unsure if she has any hematemesis, she is not looking,   Medications: Outpatient Medications Prior to Visit  Medication Sig   diphenhydrAMINE (BENADRYL) 25 MG tablet Take 25 mg by mouth every 6 (six) hours as needed.   ondansetron (ZOFRAN-ODT) 4 MG disintegrating tablet DISSOLVE 1 TABLET BY MOUTH EVERY 8 HOURSAS NEEDED.   topiramate (TOPAMAX) 200 MG tablet TAKE 2 TABLETS BY MOUTH EVERY NIGHT AT BEDTIME   No facility-administered medications prior to visit.    Review of Systems  Constitutional:  Negative for fatigue and fever.  Respiratory:  Negative for cough and shortness of breath.   Cardiovascular:  Negative for chest pain and leg swelling.  Gastrointestinal:  Positive for abdominal distention, abdominal pain, nausea and vomiting.  Neurological:  Negative for dizziness and headaches.      Objective    Blood pressure 115/83, pulse 87, weight 170 lb 8 oz (77.3 kg), SpO2 100 %.   Physical Exam Vitals reviewed.  Constitutional:      Appearance: She is  not ill-appearing.  HENT:     Head: Normocephalic.  Eyes:     Conjunctiva/sclera: Conjunctivae normal.  Cardiovascular:     Rate and Rhythm: Normal rate.  Pulmonary:     Effort: Pulmonary effort is normal. No respiratory distress.  Abdominal:     Palpations: Abdomen is soft.     Tenderness: There is generalized abdominal tenderness.     Comments: Pt is tender to whole abdomen, less tender LLQ. No visible skin changes  Neurological:     General: No focal deficit present.     Mental Status: She is alert and oriented to person, place, and time.  Psychiatric:        Mood and Affect: Mood normal.        Behavior: Behavior normal.      No results found for any visits on 10/13/22.  Assessment & Plan     Nausea/vomiting 3. Abdominal pain,generalized 3. Gastroparesis  Encouraged pt to call and get on GI's cancellation list Switch zofran to reglan 5-10 mg TID.  Ordered cmp/lipase Ordered abdominal CT  Advised to watch for hematemesis -- any BRB in vomit recommend emergency room  Return if symptoms worsen or fail to improve.      I, Mikey Kirschner, PA-C have reviewed all documentation for this visit. The documentation on  10/13/22  for the exam, diagnosis, procedures, and orders are all accurate and complete.  Mikey Kirschner, PA-C [facility] [address] [place], [place], [address] Office: [phone] Fax: [phone]   [facility]

## 2022-10-14 LAB — COMPREHENSIVE METABOLIC PANEL
ALT: 16 IU/L (ref 0–32)
AST: 16 IU/L (ref 0–40)
Albumin/Globulin Ratio: 1.9 (ref 1.2–2.2)
Albumin: 4.9 g/dL (ref 3.9–4.9)
Alkaline Phosphatase: 44 IU/L (ref 44–121)
BUN/Creatinine Ratio: 9 (ref 9–23)
BUN: 8 mg/dL (ref 6–24)
Bilirubin Total: 0.2 mg/dL (ref 0.0–1.2)
CO2: 20 mmol/L (ref 20–29)
Calcium: 9.3 mg/dL (ref 8.7–10.2)
Chloride: 106 mmol/L (ref 96–106)
Creatinine, Ser: 0.92 mg/dL (ref 0.57–1.00)
Globulin, Total: 2.6 g/dL (ref 1.5–4.5)
Glucose: 99 mg/dL (ref 70–99)
Potassium: 3.6 mmol/L (ref 3.5–5.2)
Sodium: 139 mmol/L (ref 134–144)
Total Protein: 7.5 g/dL (ref 6.0–8.5)
eGFR: 76 mL/min/{1.73_m2} (ref >59)

## 2022-10-14 LAB — LIPASE: Lipase: 28 U/L (ref 14–72)

## 2022-10-27 ENCOUNTER — Ambulatory Visit
Admission: RE | Admit: 2022-10-27 | Discharge: 2022-10-27 | Disposition: A | Discharge status: Home / Self Care | Payer: Commercial Managed Care - PPO | Source: Ambulatory Visit | Attending: Physician Assistant | Admitting: Physician Assistant

## 2022-10-27 ENCOUNTER — Other Ambulatory Visit: Payer: Self-pay | Admitting: Physician Assistant

## 2022-10-27 DIAGNOSIS — G9050 Complex regional pain syndrome I, unspecified: Secondary | ICD-10-CM

## 2022-10-27 DIAGNOSIS — R112 Nausea with vomiting, unspecified: Secondary | ICD-10-CM

## 2022-10-27 DIAGNOSIS — R1084 Generalized abdominal pain: Secondary | ICD-10-CM | POA: Diagnosis present

## 2022-10-27 MED ORDER — IOHEXOL 300 MG/ML  SOLN
100.0000 mL | Freq: Once | INTRAMUSCULAR | Status: AC | PRN
  Administered 2022-10-27: 100 mL via INTRAVENOUS

## 2022-10-28 ENCOUNTER — Ambulatory Visit (INDEPENDENT_AMBULATORY_CARE_PROVIDER_SITE_OTHER): Payer: Commercial Managed Care - PPO | Admitting: Physician Assistant

## 2022-10-28 ENCOUNTER — Encounter: Payer: Self-pay | Admitting: Physician Assistant

## 2022-10-28 VITALS — BP 126/63 | HR 78 | Ht 65.0 in | Wt 177.0 lb

## 2022-10-28 DIAGNOSIS — N83201 Unspecified ovarian cyst, right side: Secondary | ICD-10-CM | POA: Insufficient documentation

## 2022-10-28 NOTE — Progress Notes (Signed)
      Established patient visit   Patient: Michele Estrada   DOB: [birth year]   52 y.o. Female  MRN: [id number] Visit Date: 10/28/2022  Today's healthcare provider: Mikey Kirschner, PA-C     Subjective    HPI  Pt presents today for handicap form and to discuss her CT scan.  Medications: Outpatient Medications Prior to Visit  Medication Sig   diphenhydrAMINE (BENADRYL) 25 MG tablet Take 25 mg by mouth every 6 (six) hours as needed.   metoCLOPramide (REGLAN) 5 MG tablet Take 1-2 tablets (5-10 mg total) by mouth 3 (three) times daily before meals.   ondansetron (ZOFRAN-ODT) 4 MG disintegrating tablet DISSOLVE 1 TABLET BY MOUTH EVERY 8 HOURSAS NEEDED.   topiramate (TOPAMAX) 200 MG tablet TAKE 2 TABLETS BY MOUTH EVERY NIGHT AT BEDTIME   No facility-administered medications prior to visit.       Objective    BP 126/63 (BP Location: Left Arm, Patient Position: Sitting, Cuff Size: Normal)   Pulse 78   Ht 5' 5"$  (1.651 m)   Wt 177 lb (80.3 kg)   SpO2 98%   BMI 29.45 kg/m    Physical Exam Vitals reviewed.  Constitutional:      Appearance: She is not ill-appearing.  HENT:     Head: Normocephalic.  Eyes:     Conjunctiva/sclera: Conjunctivae normal.  Cardiovascular:     Rate and Rhythm: Normal rate.  Pulmonary:     Effort: Pulmonary effort is normal. No respiratory distress.  Neurological:     General: No focal deficit present.     Mental Status: She is alert and oriented to person, place, and time.  Psychiatric:        Mood and Affect: Mood normal.        Behavior: Behavior normal.      No results found for any visits on 10/28/22.  Assessment & Plan     Handcap forms filled out  Ovarian cyst No worrisome findings on CT scan.  A septated cyst or 2 adjacent cysts in the right ovary measuring up to 4.7 cm. Advised no further workup is needed  I, Mikey Kirschner, PA-C have reviewed all documentation for this visit. The documentation on  10/28/22 for the exam,  diagnosis, procedures, and orders are all accurate and complete.  Mikey Kirschner, PA-C [facility] [address] [place], [place], [address] Office: [phone] Fax: [place]

## 2022-11-04 ENCOUNTER — Telehealth: Payer: Self-pay

## 2022-11-05 ENCOUNTER — Telehealth: Payer: Self-pay

## 2022-11-05 NOTE — Telephone Encounter (Signed)
Copied from [place] [id number]. Topic: Quick Communication - See Telephone Encounter >> Nov 04, 2022  1:49 PM Sherolyn Buba wrote: CRM for notification. See Telephone encounter for: 11/04/22.

## 2022-11-16 ENCOUNTER — Other Ambulatory Visit: Payer: Self-pay | Admitting: Physician Assistant

## 2022-11-16 DIAGNOSIS — R112 Nausea with vomiting, unspecified: Secondary | ICD-10-CM

## 2022-12-08 ENCOUNTER — Encounter: Payer: Self-pay | Admitting: Physician Assistant

## 2022-12-15 ENCOUNTER — Telehealth: Payer: Self-pay | Admitting: Physician Assistant

## 2022-12-15 ENCOUNTER — Telehealth: Payer: Self-pay

## 2022-12-15 ENCOUNTER — Other Ambulatory Visit: Payer: Self-pay | Admitting: Physician Assistant

## 2022-12-15 DIAGNOSIS — R112 Nausea with vomiting, unspecified: Secondary | ICD-10-CM

## 2022-12-15 DIAGNOSIS — K3184 Gastroparesis: Secondary | ICD-10-CM

## 2022-12-15 NOTE — Telephone Encounter (Signed)
Pt says she was currently prescribed nausea medicine and pt says she cannot take the medication because she has seizures and she says 20 pills of Zofran isn't enough. Please advise.

## 2022-12-15 NOTE — Telephone Encounter (Signed)
Copied from CRM [id number]. Topic: Referral - Question >> Dec 15, 2022 11:55 AM Epimenio Foot F wrote: Reason for CRM: Pt is calling in because she was referred to a GI specialist at [facility] and they set her up an appointment for May 8th , and she says that doesn't work for her and the office says they don't see a reason to see her any sooner. Pt wants to know if she can get a referral to another GI. Pt says she isn't confident with the current practice she was referred to. Pt wants to know if she can be referred to [facility] or another another GI.
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
## 2023-01-20 ENCOUNTER — Other Ambulatory Visit: Payer: Self-pay | Admitting: Physician Assistant

## 2023-01-20 DIAGNOSIS — R112 Nausea with vomiting, unspecified: Secondary | ICD-10-CM

## 2023-01-26 ENCOUNTER — Other Ambulatory Visit: Payer: Self-pay | Admitting: Physician Assistant

## 2023-01-26 DIAGNOSIS — G9050 Complex regional pain syndrome I, unspecified: Secondary | ICD-10-CM

## 2023-03-06 ENCOUNTER — Emergency Department
Admission: EM | Admit: 2023-03-06 | Discharge: 2023-03-06 | Disposition: A | Discharge status: Home / Self Care | Payer: Commercial Managed Care - PPO | Attending: Emergency Medicine | Admitting: Emergency Medicine

## 2023-03-06 ENCOUNTER — Other Ambulatory Visit: Payer: Self-pay

## 2023-03-06 ENCOUNTER — Emergency Department: Payer: Commercial Managed Care - PPO

## 2023-03-06 DIAGNOSIS — R0600 Dyspnea, unspecified: Secondary | ICD-10-CM | POA: Insufficient documentation

## 2023-03-06 DIAGNOSIS — D72829 Elevated white blood cell count, unspecified: Secondary | ICD-10-CM | POA: Insufficient documentation

## 2023-03-06 DIAGNOSIS — R079 Chest pain, unspecified: Secondary | ICD-10-CM | POA: Diagnosis not present

## 2023-03-06 DIAGNOSIS — E876 Hypokalemia: Secondary | ICD-10-CM | POA: Insufficient documentation

## 2023-03-06 DIAGNOSIS — R002 Palpitations: Secondary | ICD-10-CM | POA: Insufficient documentation

## 2023-03-06 LAB — BASIC METABOLIC PANEL
Anion gap: 10 (ref 5–15)
BUN: 16 mg/dL (ref 6–20)
CO2: 16 mmol/L — ABNORMAL LOW (ref 22–32)
Calcium: 8.9 mg/dL (ref 8.9–10.3)
Chloride: 110 mmol/L (ref 98–111)
Creatinine, Ser: 1.04 mg/dL — ABNORMAL HIGH (ref 0.44–1.00)
GFR, Estimated: >60 mL/min (ref >60)
Glucose, Bld: 126 mg/dL — ABNORMAL HIGH (ref 70–99)
Potassium: 3.2 mmol/L — ABNORMAL LOW (ref 3.5–5.1)
Sodium: 136 mmol/L (ref 135–145)

## 2023-03-06 LAB — CBC WITH DIFFERENTIAL/PLATELET
Abs Immature Granulocytes: 0.06 10*3/uL (ref 0.00–0.07)
Basophils Absolute: 0 10*3/uL (ref 0.0–0.1)
Basophils Relative: 0 %
Eosinophils Absolute: 0 10*3/uL (ref 0.0–0.5)
Eosinophils Relative: 0 %
HCT: 38.8 % (ref 36.0–46.0)
Hemoglobin: 13 g/dL (ref 12.0–15.0)
Immature Granulocytes: 0 %
Lymphocytes Relative: 14 %
Lymphs Abs: 2 10*3/uL (ref 0.7–4.0)
MCH: 31.4 pg (ref 26.0–34.0)
MCHC: 33.5 g/dL (ref 30.0–36.0)
MCV: 93.7 fL (ref 80.0–100.0)
Monocytes Absolute: 0.8 10*3/uL (ref 0.1–1.0)
Monocytes Relative: 6 %
Neutro Abs: 11 10*3/uL — ABNORMAL HIGH (ref 1.7–7.7)
Neutrophils Relative %: 80 %
Platelets: 259 10*3/uL (ref 150–400)
RBC: 4.14 MIL/uL (ref 3.87–5.11)
RDW: 12.4 % (ref 11.5–15.5)
WBC: 13.8 10*3/uL — ABNORMAL HIGH (ref 4.0–10.5)
nRBC: 0 % (ref 0.0–0.2)

## 2023-03-06 LAB — TROPONIN I (HIGH SENSITIVITY)
Troponin I (High Sensitivity): 6 ng/L (ref <18)
Troponin I (High Sensitivity): 6 ng/L (ref <18)

## 2023-03-06 LAB — D-DIMER, QUANTITATIVE: D-Dimer, Quant: 0.4 ug/mL-FEU (ref 0.00–0.50)

## 2023-03-06 LAB — MAGNESIUM: Magnesium: 2 mg/dL (ref 1.7–2.4)

## 2023-03-06 MED ORDER — LACTATED RINGERS IV BOLUS
1000.0000 mL | Freq: Once | INTRAVENOUS | Status: AC
  Administered 2023-03-06: 1000 mL via INTRAVENOUS

## 2023-03-06 MED ORDER — KETOROLAC TROMETHAMINE 30 MG/ML IJ SOLN
15.0000 mg | Freq: Once | INTRAMUSCULAR | Status: AC
  Administered 2023-03-06: 15 mg via INTRAVENOUS
  Filled 2023-03-06: qty 1

## 2023-03-06 MED ORDER — LIDOCAINE 5 % EX PTCH
1.0000 | MEDICATED_PATCH | Freq: Once | CUTANEOUS | Status: DC
  Administered 2023-03-06: 1 via TRANSDERMAL
  Filled 2023-03-06: qty 1

## 2023-03-06 MED ORDER — POTASSIUM CHLORIDE CRYS ER 20 MEQ PO TBCR
40.0000 meq | EXTENDED_RELEASE_TABLET | Freq: Once | ORAL | Status: AC
  Administered 2023-03-06: 40 meq via ORAL
  Filled 2023-03-06: qty 2

## 2023-03-06 NOTE — ED Provider Notes (Signed)
[facility] Provider Note    Event Date/Time   First MD Initiated Contact with Patient 03/06/23 1839     (approximate)   History   Chief Complaint Palpitations   HPI  Michele Estrada is a 52 y.o. female with past medical history of complex regional pain syndrome, DVT, anemia, migraines, seizures, and anxiety who presents to the ED complaining of palpitations.  Patient reports that just after lunch she began to feel like her heart was racing and her Apple Watch was telling her that her heart rate was elevated.  This been associated with some sharp pain in her chest and some mild difficulty breathing.  She does state that the chest pain is not unusual for her, she has dealt with costochondritis frequently in the past and describes current symptoms as similar.  She denies any fevers or cough and has not noticed any pain or swelling in her legs.  She denies significant cardiac history.     Physical Exam   Triage Vital Signs: ED Triage Vitals  Enc Vitals Group     BP 03/06/23 1836 114/83     Pulse Rate 03/06/23 1836 (!) 107     Resp 03/06/23 1836 (!) 22     Temp 03/06/23 1836 98.2 F (36.8 C)     Temp Source 03/06/23 1836 Oral     SpO2 03/06/23 1836 98 %     Weight 03/06/23 1837 177 lb 0.5 oz (80.3 kg)     Height --      Head Circumference --      Peak Flow --      Pain Score 03/06/23 1837 10     Pain Loc --      Pain Edu? --      Excl. in GC? --     Most recent vital signs: Vitals:   03/06/23 1836 03/06/23 2143  BP: 114/83 120/76  Pulse: (!) 107 79  Resp: (!) 22 13  Temp: 98.2 F (36.8 C) (!) 97.4 F (36.3 C)  SpO2: 98% 100%    Constitutional: Alert and oriented. Eyes: Conjunctivae are normal. Head: Atraumatic. Nose: No congestion/rhinnorhea. Mouth/Throat: Mucous membranes are moist.  Cardiovascular: Normal rate, regular rhythm. Grossly normal heart sounds.  2+ radial and DP pulses bilaterally. Respiratory: Normal respiratory effort.   No retractions. Lungs CTAB.  Anterior chest wall tenderness to palpation noted. Gastrointestinal: Soft and nontender. No distention. Musculoskeletal: No lower extremity tenderness nor edema.  Neurologic:  Normal speech and language. No gross focal neurologic deficits are appreciated.    ED Results / Procedures / Treatments   Labs (all labs ordered are listed, but only abnormal results are displayed) Labs Reviewed  CBC WITH DIFFERENTIAL/PLATELET - Abnormal; Notable for the following components:      Result Value   WBC 13.8 (*)    Neutro Abs 11.0 (*)    All other components within normal limits  BASIC METABOLIC PANEL - Abnormal; Notable for the following components:   Potassium 3.2 (*)    CO2 16 (*)    Glucose, Bld 126 (*)    Creatinine, Ser 1.04 (*)    All other components within normal limits  MAGNESIUM  D-DIMER, QUANTITATIVE  TROPONIN I (HIGH SENSITIVITY)  TROPONIN I (HIGH SENSITIVITY)     EKG  ED ECG REPORT I, Chesley Noon, the attending physician, personally viewed and interpreted this ECG.   Date: 03/06/2023  EKG Time: 18:32  Rate: 107  Rhythm: sinus tachycardia  Axis:  Normal  Intervals:none  ST&T Change: None  RADIOLOGY Chest x-ray reviewed and interpreted by me with no infiltrate, edema, or effusion.  PROCEDURES:  Critical Care performed: No  Procedures   MEDICATIONS ORDERED IN ED: Medications  lidocaine (LIDODERM) 5 % 1 patch (1 patch Transdermal Patch Applied 03/06/23 2118)  potassium chloride SA (KLOR-CON M) CR tablet 40 mEq (has no administration in time range)  lactated ringers bolus 1,000 mL (0 mLs Intravenous Stopped 03/06/23 2118)  ketorolac (TORADOL) 30 MG/ML injection 15 mg (15 mg Intravenous Given 03/06/23 2026)     IMPRESSION / MDM / ASSESSMENT AND PLAN / ED COURSE  I reviewed the triage vital signs and the nursing notes.                              52 y.o. female with past medical history of complex regional pain syndrome, DVT,  anemia, migraines, seizures, and anxiety who presents to the ED complaining of palpitations, chest pain, and shortness of breath for the past couple of hours.  Patient's presentation is most consistent with acute presentation with potential threat to life or bodily function.  Differential diagnosis includes, but is not limited to, arrhythmia, ACS, PE, pneumonia, pneumothorax, musculoskeletal pain, anemia, electrolyte abnormality, anxiety.  Patient nontoxic-appearing and in no acute distress, vital signs remarkable for tachycardia but otherwise reassuring.  EKG shows sinus tachycardia with no ischemic changes, symptoms seem atypical for ACS.  She does have history of DVT remotely while taking OCPs, no current symptoms of DVT.  We will screen D-dimer and troponin, observe on cardiac monitor.  Additional labs pending to check for anemia or electrolyte abnormality.  Labs remarkable for mild leukocytosis and mild hypokalemia, no significant anemia or AKI noted.  Chest x-ray is unremarkable and 2 sets of troponin are within normal limits.  Heart rate much improved following IV fluids, no evidence of arrhythmia on cardiac monitor.  D-dimer within normal limits and I doubt PE.  Suspect musculoskeletal chest pain given is reproducible with palpation, patient reports long history of costochondritis.  She is appropriate for discharge home with PCP follow-up, was counseled to return to the ED for new or worsening symptoms, patient agrees with plan.      FINAL CLINICAL IMPRESSION(S) / ED DIAGNOSES   Final diagnoses:  Palpitations  Chest pain, unspecified type     Rx / DC Orders   ED Discharge Orders     None        Note:  This document was prepared using Dragon voice recognition software and may include unintentional dictation errors.   Chesley Noon, MD 03/06/23 2210

## 2023-03-06 NOTE — ED Triage Notes (Signed)
C/O palpitations today, states heart rate 118.  Patient is anxious.  Skin warm and dry. NAD

## 2023-03-10 ENCOUNTER — Ambulatory Visit (INDEPENDENT_AMBULATORY_CARE_PROVIDER_SITE_OTHER): Payer: Commercial Managed Care - PPO | Admitting: Physician Assistant

## 2023-03-10 ENCOUNTER — Encounter: Payer: Self-pay | Admitting: Physician Assistant

## 2023-03-10 VITALS — BP 113/76 | HR 87 | Resp 16 | Ht 61.0 in | Wt 163.0 lb

## 2023-03-10 DIAGNOSIS — K3184 Gastroparesis: Secondary | ICD-10-CM

## 2023-03-10 DIAGNOSIS — R112 Nausea with vomiting, unspecified: Secondary | ICD-10-CM | POA: Diagnosis not present

## 2023-03-10 DIAGNOSIS — R1084 Generalized abdominal pain: Secondary | ICD-10-CM

## 2023-03-10 DIAGNOSIS — G90521 Complex regional pain syndrome I of right lower limb: Secondary | ICD-10-CM

## 2023-03-10 NOTE — Progress Notes (Signed)
Established patient visit  Patient: Michele Estrada   DOB: [birth year]   52 y.o. Female  MRN: [id number] Visit Date: 03/10/2023  Today's healthcare provider: Debera Lat, PA-C   Chief Complaint  Patient presents with   Hospitalization Follow-up    Patient was seen in ED on 03/06/23 for palpitations   Subjective      Discussed the use of AI scribe software for clinical note transcription with the patient, who gave verbal consent to proceed.  History of Present Illness   The patient, with a history of CRPS, endometriosis, and gastroparesis, presents with multiple complaints. The patient reports ongoing chest pain and a high level of pain due to a CRPS flare. The patient is unsure if the increased pain level is related to a recent event. The patient also reports ongoing issues with gastroparesis, which has limited their diet to a few specific foods. The patient has been using a PMF device, which they report has helped to prevent seizures. The patient is currently taking topiramate and Zofran for their conditions.  The patient also has a history of endometriosis and a hysterectomy, with the ovaries left intact. The patient reports a cyst on the ovaries and a past kidney stone. There is a concern that the endometriosis may have returned, and the patient is scheduled to see an OBGYN at [facility] for further evaluation. The patient expresses frustration with previous gastroenterology care and is seeking a referral to a provider who understands CRPS.     Per chart review, was seen at ED [facility] on 03/06/23 for palpations. EKG showed sinus tachycardia with no ischemic changes. D-dimer and troponin were WNL and CXR was unremarkable. Suspected costochondritis.     03/10/2023    3:05 PM 10/28/2022   11:05 AM 03/12/2022    4:15 PM  Depression screen PHQ 2/9  Decreased Interest 0 0 0  Down, Depressed, Hopeless 0 0 0  PHQ - 2 Score 0 0 0  Altered sleeping 2 0 2  Tired, decreased energy 3 0 2  Change in  appetite 2 0 0  Feeling bad or failure about yourself  0 0 0  Trouble concentrating 1 0 0  Moving slowly or fidgety/restless 0 0 0  Suicidal thoughts 0 0 0  PHQ-9 Score 8 0 4  Difficult doing work/chores  Not difficult at all Very difficult      10/28/2022   11:06 AM  GAD 7 : Generalized Anxiety Score  Nervous, Anxious, on Edge 1  Control/stop worrying 1  Worry too much - different things 1  Trouble relaxing 1  Restless 3  Easily annoyed or irritable 0  Afraid - awful might happen 1  Total GAD 7 Score 8  Anxiety Difficulty Not difficult at all    Medications: Outpatient Medications Prior to Visit  Medication Sig   ondansetron (ZOFRAN-ODT) 4 MG disintegrating tablet DISSOLVE ONE TABLET BY MOUTH EVERY EIGHT HOURS AS NEEDED.   topiramate (TOPAMAX) 200 MG tablet TAKE TWO TABLETS BY MOUTH EVERY NIGHT AT BEDTIME   [DISCONTINUED] diphenhydrAMINE (BENADRYL) 25 MG tablet Take 25 mg by mouth every 6 (six) hours as needed.   [DISCONTINUED] metoCLOPramide (REGLAN) 5 MG tablet Take 1-2 tablets (5-10 mg total) by mouth 3 (three) times daily before meals.   No facility-administered medications prior to visit.    ROS negative except Except see HPI      Objective    BP 113/76   Pulse 87   Resp 16   Ht 5\' 1"  (  1.549 m)   Wt 163 lb (73.9 kg)   SpO2 100%   BMI 30.80 kg/m    Physical Exam Vitals reviewed.  Constitutional:      General: She is not in acute distress.    Appearance: Normal appearance. She is well-developed. She is not diaphoretic.  HENT:     Head: Normocephalic and atraumatic.  Eyes:     General: No scleral icterus.    Conjunctiva/sclera: Conjunctivae normal.  Neck:     Thyroid: No thyromegaly.  Cardiovascular:     Rate and Rhythm: Normal rate and regular rhythm.     Pulses: Normal pulses.     Heart sounds: Normal heart sounds. No murmur heard. Pulmonary:     Effort: Pulmonary effort is normal. No respiratory distress.     Breath sounds: Normal breath  sounds. No wheezing, rhonchi or rales.  Musculoskeletal:     Cervical back: Neck supple.     Right lower leg: No edema.     Left lower leg: No edema.  Lymphadenopathy:     Cervical: No cervical adenopathy.  Skin:    General: Skin is warm and dry.     Findings: No rash.  Neurological:     Mental Status: She is alert and oriented to person, place, and time. Mental status is at baseline.  Psychiatric:        Mood and Affect: Mood normal.        Behavior: Behavior normal.      No results found for any visits on 03/10/23.  Assessment & Plan        Complex Regional Pain Syndrome (CRPS): Chronic. Increased pain level, possibly related to recent events. Patient uses a PMF therapy device which she reports has been beneficial in managing her symptoms. -Continue current management with PMF therapy device. Hx of Seizures: No seizures since June 2022, possibly related to use of PMF therapy device. -Continue current management with PMF therapy device and Topiramate 400mg  daily.  Nausea/vomiting Gastroparesis: Ongoing nausea managed with Zofran. Patient has a limited diet due to intolerance of many foods. -Requested a referral to a gastroenterologist who understands CRPS for further management. The last referral to GI was placed by her PCP in Margean of this year. Will check with our scheduler on a status of this referral  Endometriosis: Possible recurrence of endometriosis, with an appointment scheduled at [facility] on 17th July 2024. -Continue current management and await assessment at [facility].  Potential Postural Orthostatic Tachycardia Syndrome (POTS): Patient has concerns about possible onset of POTS due to changes in heart rate. Cardiac monitoring in ED was unremarkable. -Monitor symptoms and consider testing if symptoms persist or worsen. -Will refer to Cardiology if symptoms persist Will re-check BMP and CBC.  General Health Maintenance / Followup  Plans: -Continue current medications including Topiramate 400mg  daily and Zofran as needed for nausea. -Follow up after gastroenterology and OBGYN appointments to reassess and adjust management plan as necessary.      No follow-ups on file.     The patient was advised to call back or seek an in-person evaluation if the symptoms worsen or if the condition fails to improve as anticipated.  I discussed the assessment and treatment plan with the patient. The patient was provided an opportunity to ask questions and all were answered. The patient agreed with the plan and demonstrated an understanding of the instructions.  I, Debera Lat, PA-C have reviewed all documentation for this visit. The documentation  on  03/10/23  for the exam, diagnosis, procedures, and orders are all accurate and complete.  Debera Lat, [facility], MMS [facility] [phone] (phone) [phone] (fax)  [facility]

## 2023-04-14 ENCOUNTER — Other Ambulatory Visit: Payer: Self-pay | Admitting: Physician Assistant

## 2023-04-14 DIAGNOSIS — R112 Nausea with vomiting, unspecified: Secondary | ICD-10-CM

## 2023-04-20 ENCOUNTER — Other Ambulatory Visit: Payer: Self-pay | Admitting: Physician Assistant

## 2023-04-20 DIAGNOSIS — G9050 Complex regional pain syndrome I, unspecified: Secondary | ICD-10-CM

## 2023-04-21 ENCOUNTER — Other Ambulatory Visit: Payer: Self-pay | Admitting: Family Medicine

## 2023-04-21 DIAGNOSIS — G9050 Complex regional pain syndrome I, unspecified: Secondary | ICD-10-CM

## 2023-04-21 MED ORDER — TOPIRAMATE 200 MG PO TABS: 400.0000 mg | ORAL_TABLET | Freq: Every day | ORAL | 0 refills | Status: DC

## 2023-04-30 ENCOUNTER — Ambulatory Visit: Payer: Self-pay

## 2023-04-30 ENCOUNTER — Other Ambulatory Visit: Payer: Self-pay

## 2023-04-30 ENCOUNTER — Emergency Department
Admission: EM | Admit: 2023-04-30 | Discharge: 2023-04-30 | Disposition: A | Discharge status: Home / Self Care | Payer: Commercial Managed Care - PPO | Attending: Emergency Medicine | Admitting: Emergency Medicine

## 2023-04-30 DIAGNOSIS — M546 Pain in thoracic spine: Secondary | ICD-10-CM

## 2023-04-30 DIAGNOSIS — M549 Dorsalgia, unspecified: Secondary | ICD-10-CM | POA: Diagnosis present

## 2023-04-30 MED ORDER — OXYCODONE HCL 5 MG PO TABS
5.0000 mg | ORAL_TABLET | Freq: Once | ORAL | Status: AC
  Administered 2023-04-30: 5 mg via ORAL
  Filled 2023-04-30: qty 1

## 2023-04-30 MED ORDER — OXYCODONE HCL 5 MG PO TABS: 5.0000 mg | ORAL_TABLET | Freq: Four times a day (QID) | ORAL | 0 refills | Status: AC | PRN

## 2023-04-30 NOTE — ED Triage Notes (Addendum)
Pt fell on Wednesday and had a muscle spasm in the back- pt has a history of complex regional pain syndrome and hasn't had back spasm in awhile. Pt reports trouble w/ balance since fall, denies LOC, thinners, head injury. Pt denies injury from fall. Bruises noted on right arm- pt states she broke fall. ROM intact. Pt AOX4, NAD noted. Pt took baclofen and oxycodone with some relief.

## 2023-04-30 NOTE — ED Notes (Signed)
Report given to Rose RN.

## 2023-04-30 NOTE — Discharge Instructions (Signed)
You can take the baclofen and oxycodone as needed for your pain and muscle spasms.  If your symptoms do not follow-up with your primary care provider.

## 2023-04-30 NOTE — ED Provider Notes (Signed)
[facility] Provider Note    Event Date/Time   First MD Initiated Contact with Patient 04/30/23 1806     (approximate)   History   Back Pain   HPI  Michele Estrada is a 52 y.o. female with PMH of complex regional pain syndrome and functional movement disorder who presents for evaluation of ongoing back pain and increased spasms since Wednesday.  Patient states she was helping a family member move earlier this week and believes she overexerted herself, this is caused an increase in her CRPS and FMD symptoms.  On Wednesday patient had a back spasm that caused her to fall, patient's husband describes it as a slow descent to the ground.  Patient did not hit her head and does not report any injuries from her fall.  She is normally fairly mobile, however since Wednesday she has been basically bedbound.  Patient took some baclofen and oxycodone she had leftover and did report an improvement in her pain.  Patient endorses some difficulty balancing since her fall.  She also endorses urinary incontinence and numbness and tingling down her legs, however she reports that this is not new for her.     Physical Exam   Triage Vital Signs: ED Triage Vitals  Encounter Vitals Group     BP 04/30/23 1720 105/74     Systolic BP Percentile --      Diastolic BP Percentile --      Pulse Rate 04/30/23 1720 70     Resp 04/30/23 1720 18     Temp 04/30/23 1720 98.3 F (36.8 C)     Temp src --      SpO2 04/30/23 1720 99 %     Weight --      Height --      Head Circumference --      Peak Flow --      Pain Score 04/30/23 1724 10     Pain Loc --      Pain Education --      Exclude from Growth Chart --     Most recent vital signs: Vitals:   04/30/23 1720  BP: 105/74  Pulse: 70  Resp: 18  Temp: 98.3 F (36.8 C)  SpO2: 99%    General: Awake, mild distress.  Frequent spasms throughout my exam. CV:  Good peripheral perfusion.  RRR. Resp:  Normal effort.  CTAB. Abd:  No  distention.  Other:  Tenderness to palpation throughout thoracic spine.    ED Results / Procedures / Treatments   Labs (all labs ordered are listed, but only abnormal results are displayed) Labs Reviewed - No data to display   PROCEDURES:  Critical Care performed: No  Procedures   MEDICATIONS ORDERED IN ED: Medications  oxyCODONE (Oxy IR/ROXICODONE) immediate release tablet 5 mg (has no administration in time range)     IMPRESSION / MDM / ASSESSMENT AND PLAN / ED COURSE  I reviewed the triage vital signs and the nursing notes.                             52 year old female presents for evaluation of ongoing back pain and increased muscle spasms with history of CRPS.  VSS in triage and patient NAD on exam.  Differential diagnosis includes, but is not limited to, muscle spasms, strain, exacerbation of CRPS.  Patient's presentation is most consistent with exacerbation of chronic illness.  I discussed with Dr. Lenard Lance  possibly giving an MRI given patient's history of CRPS and report of balance issues, however we both feel it is not necessary at this time.  I believe patient's CRPS has been exacerbated by her increased physical activity for the past few days.  I will treat patient with muscle relaxers and pain medication as this has been effective for her.  I advised patient to follow-up with her primary care provider if she has worsening symptoms.  Patient voiced understanding, all questions were answered and she was stable at discharge.     FINAL CLINICAL IMPRESSION(S) / ED DIAGNOSES   Final diagnoses:  Bilateral thoracic back pain, unspecified chronicity     Rx / DC Orders   ED Discharge Orders          Ordered    oxyCODONE (ROXICODONE) 5 MG immediate release tablet  Every 6 hours PRN        04/30/23 1858             Note:  This document was prepared using Dragon voice recognition software and may include unintentional dictation errors.   Cameron Ali, PA-C 04/30/23 1859    Minna Antis, MD 04/30/23 1904
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
## 2023-05-03 ENCOUNTER — Ambulatory Visit: Payer: Self-pay

## 2023-05-03 NOTE — Telephone Encounter (Signed)
Recommend appt - any provider with openings - preference for anyone taking new patients as she needs to establish new PCP anyways Edmon Crape, [place])

## 2023-05-03 NOTE — Telephone Encounter (Signed)
Patient advised. Requested to see [place].

## 2023-05-03 NOTE — Telephone Encounter (Signed)
  Chief Complaint:  severe pain and numbness and tingling to both arms- right than left elbow down /right shoulder to hand   Symptoms: severe pain , back painh/o balance b Frequency: arm pain weakness and umbness and tingling to both sides is new and worse Pertinent Negatives: Patient denies vision or speech problems unable to tell if has headache Disposition: [] ED /[] Urgent Care (no appt availability in office) / [] Appointment(In office/virtual)/ []  [facility]/ [] Home Care/ [x] Refused Recommended Disposition /[] [facility]/ []  Follow-up with PCP Additional Notes: please ask PCP to review note Reason for Disposition . [1] Numbness (i.e., loss of sensation) of the face, arm / hand, or leg / foot on one side of the body AND [2] sudden onset AND [3] brief (now gone)    Both sides  Answer Assessment - Initial Assessment Questions 1. SYMPTOM: "What is the main symptom you are concerned about?" (e.g., weakness, numbness)     Numbness and tingling right > left arm  2. ONSET: "When did this start?" (minutes, hours, days; while sleeping)     Wednesday  4. PATTERN "Does this come and go, or has it been constant since it started?"  "Is it present now?"     Comes and goes/yes 6. NEUROLOGIC SYMPTOMS: "Have you had any of the following symptoms: headache, dizziness, vision loss, double vision, changes in speech, unsteady on your feet?"     CRPS right sided spasms and weakness and dizziness and vomiting on Friday/pain 10 both arms only, h/o weakness and spasms and hard to get head up dizziness h/o balance issues  7. OTHER SYMPTOMS: "Do you have any other symptoms?"     H/o back spasms, grabbed door handle and held on and held onto wall and into fetal position- took Baclofen and two Oxycodone  Protocols used: Neurologic Deficit-A-AH

## 2023-05-04 ENCOUNTER — Encounter: Payer: Self-pay | Admitting: Physician Assistant

## 2023-05-04 ENCOUNTER — Ambulatory Visit (INDEPENDENT_AMBULATORY_CARE_PROVIDER_SITE_OTHER): Payer: Commercial Managed Care - PPO | Admitting: Physician Assistant

## 2023-05-04 ENCOUNTER — Other Ambulatory Visit: Payer: Self-pay | Admitting: Family Medicine

## 2023-05-04 VITALS — BP 113/85 | HR 65 | Ht 61.0 in | Wt 160.3 lb

## 2023-05-04 DIAGNOSIS — M62830 Muscle spasm of back: Secondary | ICD-10-CM | POA: Diagnosis not present

## 2023-05-04 DIAGNOSIS — G9050 Complex regional pain syndrome I, unspecified: Secondary | ICD-10-CM

## 2023-05-04 DIAGNOSIS — G9059 Complex regional pain syndrome I of other specified site: Secondary | ICD-10-CM | POA: Diagnosis not present

## 2023-05-04 MED ORDER — CYCLOBENZAPRINE HCL 5 MG PO TABS: 5.0000 mg | ORAL_TABLET | Freq: Three times a day (TID) | ORAL | 1 refills | Status: DC | PRN

## 2023-05-04 MED ORDER — MELOXICAM 7.5 MG PO TABS: 7.5000 mg | ORAL_TABLET | Freq: Every day | ORAL | 0 refills | Status: DC

## 2023-05-04 NOTE — Progress Notes (Signed)
Established patient visit  Patient: Michele Estrada   DOB: Jan 27, 1971   52 y.o. Female  MRN: [id number] Visit Date: 05/04/2023  Today's healthcare provider: Debera Lat, PA-C   Chief Complaint  Patient presents with   Medical Management of Chronic Issues    Pain/ spasms located in the mid-lower back, neck, bilateral arms. Wednesday night patient took a fall, prior treatment has been baclofen and oxycodone   Subjective   Discussed the use of AI scribe software for clinical note transcription with the patient, who gave verbal consent to proceed.  History of Present Illness   The patient, with a past medical history of Complex Regional Pain Syndrome (CRPS), Functional Neurological Disorder (FND), and gastroparesis, presents with lower back spasms and pain that started on a Wednesday night. The patient attributes the onset of the pain to overexertion and stress from moving their aunt into a new apartment. Despite not doing any heavy lifting, the patient believes the combination of stress, increased physical activity, and additional driving triggered the back spasms. The patient describes the spasms as severe, similar to those experienced when their spinal cord stimulator failed in 2020. The patient reports that the current medications, baclofen and oxycodone, are not providing relief for the pain. The patient also mentions having nausea and vomiting, which have been ongoing issues due to their gastroparesis. The patient has been able to eat in the past few days, mainly consuming chicken and shrimp. The patient also reports numbness in their arms and hands associated with the spasms.           03/10/2023    3:05 PM 10/28/2022   11:05 AM 03/12/2022    4:15 PM  Depression screen PHQ 2/9  Decreased Interest 0 0 0  Down, Depressed, Hopeless 0 0 0  PHQ - 2 Score 0 0 0  Altered sleeping 2 0 2  Tired, decreased energy 3 0 2  Change in appetite 2 0 0  Feeling bad or failure about yourself  0 0 0   Trouble concentrating 1 0 0  Moving slowly or fidgety/restless 0 0 0  Suicidal thoughts 0 0 0  PHQ-9 Score 8 0 4  Difficult doing work/chores  Not difficult at all Very difficult      10/28/2022   11:06 AM  GAD 7 : Generalized Anxiety Score  Nervous, Anxious, on Edge 1  Control/stop worrying 1  Worry too much - different things 1  Trouble relaxing 1  Restless 3  Easily annoyed or irritable 0  Afraid - awful might happen 1  Total GAD 7 Score 8  Anxiety Difficulty Not difficult at all    Medications: Outpatient Medications Prior to Visit  Medication Sig   ondansetron (ZOFRAN-ODT) 4 MG disintegrating tablet DISSOLVE ONE TABLET BY MOUTH EVERY EIGHT HOURS AS NEEDED.   topiramate (TOPAMAX) 200 MG tablet Take 2 tablets (400 mg total) by mouth daily. Courtesy refill; due for PCP appt   No facility-administered medications prior to visit.    Review of Systems  All other systems reviewed and are negative.  Except see HPI       Objective    BP 113/85 (BP Location: Left Arm, Patient Position: Sitting, Cuff Size: Normal)   Pulse 65   Ht 5\' 1"  (1.549 m)   Wt 160 lb 4.8 oz (72.7 kg)   SpO2 100%   BMI 30.29 kg/m     Physical Exam Constitutional:      General: She is not in acute distress.  Appearance: Normal appearance.  HENT:     Head: Normocephalic.  Pulmonary:     Effort: Pulmonary effort is normal. No respiratory distress.  Neurological:     Mental Status: She is alert and oriented to person, place, and time. Mental status is at baseline.      No results found for any visits on 05/04/23.  Assessment & Plan        Lower Back Spasms CRPS exacerbation Severe spasms causing functional impairment, similar to previous episodes associated with spinal cord stimulator failure. Current treatment with Baclofen and Oxycodone not providing relief. -Switch from Baclofen to Flexeril for muscle relaxation based on patient's previous positive response. -Prescribe  Meloxicam for severe pain episodes, with caution due to patient's blood clotting disorder and potential for gastrointestinal bleeding. Advise patient to take with food and only when absolutely necessary.  Gastroparesis Ongoing symptoms of nausea and vomiting, with recent difficulty eating. Previous referral to GI specialist pending. -Encourage patient to seek a gastroenterologist who specializes in gastroparesis.  Complex Regional Pain Syndrome (CRPS) and Functional Neurological Disorder (FND) Chronic and worsening Ongoing management with Topiramate. Patient reports difficulty finding a neurologist who understands the coexistence of CRPS and FND. -Encourage patient to seek a neurologist and possibly a pain clinic that can manage her complex condition.  Scoliosis Contributing to overall back pain. -No specific plan discussed in the conversation.  General Health Maintenance / Followup Plans -Check in with patient in 1-2 weeks to assess response to medication changes and progress in finding specialists.     No follow-ups on file.     .The patient was advised to call back or seek an in-person evaluation if the symptoms worsen or if the condition fails to improve as anticipated.  I discussed the assessment and treatment plan with the patient. The patient was provided an opportunity to ask questions and all were answered. The patient agreed with the plan and demonstrated an understanding of the instructions.  I, Debera Lat, PA-C have reviewed all documentation for this visit. The documentation on  05/04/23  for the exam, diagnosis, procedures, and orders are all accurate and complete.  Debera Lat, [facility], MMS [facility] [phone] (phone) [phone] (fax)  [facility]

## 2023-05-12 ENCOUNTER — Other Ambulatory Visit: Payer: Self-pay | Admitting: Family Medicine

## 2023-05-12 DIAGNOSIS — G9050 Complex regional pain syndrome I, unspecified: Secondary | ICD-10-CM

## 2023-05-31 ENCOUNTER — Telehealth: Payer: Self-pay

## 2023-05-31 NOTE — Telephone Encounter (Signed)
Copied from CRM [id number]. Topic: General - Other >> May 31, 2023 12:48 PM Santiya F wrote: Reason for CRM: Ms. Alvino Chapel with [facility]. , is calling in because they received a referral from Alfredia Ferguson on this pt and they needed some clarification on the referral and if the patient was still going to come to their office. Please follow up with Ms. Alvino Chapel.

## 2023-05-31 NOTE — Telephone Encounter (Signed)
Please review. Is the patient still do to go to to [facility] GI office? Not sure if anything was changed when she came to se you. Would like to know before calling Ms. Alvino Chapel with Michele Estrada

## 2023-06-04 NOTE — Telephone Encounter (Signed)
Referral was declined for second opinion who are established with GI. They have limited capacity. They will be faxing a letter with explanation.

## 2023-06-08 ENCOUNTER — Other Ambulatory Visit: Payer: Self-pay

## 2023-06-08 ENCOUNTER — Emergency Department
Admission: EM | Admit: 2023-06-08 | Discharge: 2023-06-08 | Disposition: A | Discharge status: Home / Self Care | Payer: Commercial Managed Care - PPO | Attending: Emergency Medicine | Admitting: Emergency Medicine

## 2023-06-08 DIAGNOSIS — Z4889 Encounter for other specified surgical aftercare: Secondary | ICD-10-CM

## 2023-06-08 DIAGNOSIS — Z4801 Encounter for change or removal of surgical wound dressing: Secondary | ICD-10-CM | POA: Insufficient documentation

## 2023-06-08 NOTE — ED Notes (Signed)
Pt verbalizes understanding of discharge instructions. Opportunity for questioning and answers were provided. Pt discharged from ED to home with husband.    

## 2023-06-08 NOTE — ED Triage Notes (Signed)
Pt comes with c/o post op problem. Pt states she had surgery yesterday and is still having bleeding present. Pt has changed bandage 4 times since getting home.

## 2023-06-08 NOTE — Discharge Instructions (Addendum)
If continued bleeding please see your surgeon

## 2023-06-08 NOTE — ED Provider Notes (Signed)
   [facility] Provider Note    Event Date/Time   First MD Initiated Contact with Patient 06/08/23 1330     (approximate)   History   Post-op Problem   HPI  Michele Estrada is a 52 y.o. female who had robotic assisted surgery yesterday for endometriosis who presents because of bleeding from umbilical incision.     Physical Exam   Triage Vital Signs: ED Triage Vitals  Encounter Vitals Group     BP 06/08/23 1248 128/84     Systolic BP Percentile --      Diastolic BP Percentile --      Pulse Rate 06/08/23 1248 85     Resp 06/08/23 1248 18     Temp 06/08/23 1248 98 F (36.7 C)     Temp src --      SpO2 06/08/23 1248 100 %     Weight --      Height --      Head Circumference --      Peak Flow --      Pain Score 06/08/23 1247 0     Pain Loc --      Pain Education --      Exclude from Growth Chart --     Most recent vital signs: Vitals:   06/08/23 1248  BP: 128/84  Pulse: 85  Resp: 18  Temp: 98 F (36.7 C)  SpO2: 100%     General: Awake, no distress.  CV:  Good peripheral perfusion.  Resp:  Normal effort.  Abd:  No distention.  Small amount of dried blood on Steri-Strips, no active bleeding, well-appearing Other:     ED Results / Procedures / Treatments   Labs (all labs ordered are listed, but only abnormal results are displayed) Labs Reviewed - No data to display   EKG     RADIOLOGY     PROCEDURES:  Critical Care performed:   Procedures   MEDICATIONS ORDERED IN ED: Medications - No data to display   IMPRESSION / MDM / ASSESSMENT AND PLAN / ED COURSE  I reviewed the triage vital signs and the nursing notes. Patient's presentation is most consistent with acute, uncomplicated illness.  Patient had bleeding from incision site, strongly suspect seroma versus small hematoma as no further bleeding at this time even with gentle palpation.  No indication for imaging or labs at this time, recommend close follow-up  with her surgeon.  She agrees with this plan.        FINAL CLINICAL IMPRESSION(S) / ED DIAGNOSES   Final diagnoses:  Encounter for post surgical wound check     Rx / DC Orders   ED Discharge Orders     None        Note:  This document was prepared using Dragon voice recognition software and may include unintentional dictation errors.   Jene Every, MD 06/08/23 [phone]

## 2023-06-08 NOTE — ED Notes (Signed)
ED Provider at bedside. 

## 2023-06-12 ENCOUNTER — Other Ambulatory Visit: Payer: Self-pay | Admitting: Physician Assistant

## 2023-06-12 DIAGNOSIS — R112 Nausea with vomiting, unspecified: Secondary | ICD-10-CM

## 2023-06-15 ENCOUNTER — Other Ambulatory Visit: Payer: Self-pay | Admitting: Physician Assistant

## 2023-06-15 DIAGNOSIS — G9050 Complex regional pain syndrome I, unspecified: Secondary | ICD-10-CM

## 2023-06-16 NOTE — Telephone Encounter (Signed)
Requested Prescriptions  Pending Prescriptions Disp Refills   topiramate (TOPAMAX) 200 MG tablet [Pharmacy Med Name: TOPIRAMATE 200MG  TABLET] 90 tablet 0    Sig: TAKE TWO TABLETS (400 MG TOTAL) BY MOUTH DAILY. COURTESY REFILL DUE FOR PCP APPT     Neurology: Anticonvulsants - topiramate & zonisamide Failed - 06/15/2023  4:37 PM      Failed - Cr in normal range and within 360 days    Creatinine  Date Value Ref Range Status  06/06/2013 1.13 0.60 - 1.30 mg/dL Final   Creat  Date Value Ref Range Status  04/04/2013 0.92 0.50 - 1.10 mg/dL Final   Creatinine, Ser  Date Value Ref Range Status  03/06/2023 1.04 (H) 0.44 - 1.00 mg/dL Final         Failed - CO2 in normal range and within 360 days    CO2  Date Value Ref Range Status  03/06/2023 16 (L) 22 - 32 mmol/L Final   Co2  Date Value Ref Range Status  06/06/2013 25 21 - 32 mmol/L Final         Passed - ALT in normal range and within 360 days    ALT  Date Value Ref Range Status  10/13/2022 16 0 - 32 IU/L Final   SGPT (ALT)  Date Value Ref Range Status  06/06/2013 23 12 - 78 U/L Final         Passed - AST in normal range and within 360 days    AST  Date Value Ref Range Status  10/13/2022 16 0 - 40 IU/L Final   SGOT(AST)  Date Value Ref Range Status  06/06/2013 24 15 - 37 Unit/L Final         Passed - Completed PHQ-2 or PHQ-9 in the last 360 days      Passed - Valid encounter within last 12 months    Recent Outpatient Visits           1 month ago Complex regional pain syndrome type 1 affecting other site   [facility] [place], [place], PA-C   3 months ago Complex regional pain syndrome type 1 of right lower extremity   [facility] [place], [place], PA-C   7 months ago Ovarian cyst, right   [facility], [place], PA-C   8 months ago Nausea and vomiting, unspecified vomiting type   [facility] Alfredia Ferguson, PA-C   9 months ago Gastroparesis   [facility] Alfredia Ferguson, PA-C       Future Appointments             In 3 weeks Stoioff, Verna Czech, MD [facility]

## 2023-06-18 ENCOUNTER — Ambulatory Visit: Payer: Commercial Managed Care - PPO | Admitting: Urology

## 2023-07-08 ENCOUNTER — Ambulatory Visit: Payer: Commercial Managed Care - PPO | Admitting: Urology

## 2023-07-15 ENCOUNTER — Other Ambulatory Visit: Payer: Self-pay | Admitting: *Deleted

## 2023-07-15 DIAGNOSIS — N132 Hydronephrosis with renal and ureteral calculous obstruction: Secondary | ICD-10-CM

## 2023-07-16 ENCOUNTER — Encounter: Payer: Self-pay | Admitting: Urology

## 2023-07-16 ENCOUNTER — Ambulatory Visit (INDEPENDENT_AMBULATORY_CARE_PROVIDER_SITE_OTHER): Payer: Commercial Managed Care - PPO | Admitting: Urology

## 2023-07-16 ENCOUNTER — Ambulatory Visit
Admission: RE | Admit: 2023-07-16 | Discharge: 2023-07-16 | Disposition: A | Discharge status: Home / Self Care | Payer: Commercial Managed Care - PPO | Attending: Urology | Admitting: Urology

## 2023-07-16 ENCOUNTER — Ambulatory Visit
Admission: RE | Admit: 2023-07-16 | Discharge: 2023-07-16 | Disposition: A | Discharge status: Home / Self Care | Payer: Commercial Managed Care - PPO | Source: Ambulatory Visit | Attending: Urology | Admitting: Urology

## 2023-07-16 VITALS — BP 123/81 | HR 85 | Ht 61.0 in | Wt 153.0 lb

## 2023-07-16 DIAGNOSIS — Z09 Encounter for follow-up examination after completed treatment for conditions other than malignant neoplasm: Secondary | ICD-10-CM | POA: Diagnosis not present

## 2023-07-16 DIAGNOSIS — N132 Hydronephrosis with renal and ureteral calculous obstruction: Secondary | ICD-10-CM

## 2023-07-16 DIAGNOSIS — Z87442 Personal history of urinary calculi: Secondary | ICD-10-CM

## 2023-07-16 NOTE — Progress Notes (Signed)
I, Michele Estrada, acting as a scribe for Riki Altes, MD., have documented all relevant documentation on the behalf of Riki Altes, MD, as directed by Riki Altes, MD while in the presence of Riki Altes, MD.  07/14/2023 4:46 PM   Michele Estrada Dec 10, 1970 [id number]  Referring provider: Alfredia Ferguson, PA-C [address] [place],  [place] [address]  Chief Complaint  Patient presents with   Nephrolithiasis   Urologic history 1. Personal history of urinary calculi 2mm right UVGA calculus 05/2022 which she passed  HPI: Michele Estrada is a 52 y.o. female presents for annual follow-up.   No recurrent stone symptoms since her last visit. She recently underwent oophorectomy and is still recovering from that surgery.  A CT scan performed February 2024 for abdominal pain showed no urinary tract calculi.   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    Migraines    Scoliosis    Seizure (HCC)    Type II CRPS (complex regional pain syndrome)     Surgical History: Past Surgical History:  Procedure Laterality Date   CHOLECYSTECTOMY     LAPAROSCOPIC ASSISTED VAGINAL HYSTERECTOMY Bilateral 03/22/2014   Procedure: LAPAROSCOPIC ASSISTED VAGINAL HYSTERECTOMY, BILATERAL SALPINGECTOMY;  Surgeon: Levi Aland, MD;  Location: [facility];  Service: Gynecology;  Laterality: Bilateral;   SHOULDER SURGERY     stimulator removed   2019   TUBAL LIGATION     uterine ablation     VAGINAL HYSTERECTOMY     WRIST SURGERY Left     Home Medications:  Allergies as of 07/16/2023       Reactions   Prozac [fluoxetine Hcl] Other (See Comments)   High dose caused seizures.   Estrogens Other (See Comments)   DVT on BCPs   Gabapentin Other (See Comments)    May have made moody and maybe increased migraine frequency. "May have made moody and maybe increased migraine frequency" May have made moody and maybe increased migraine frequency. May have made moody and maybe increased migraine frequency.   Iron Hives   Pregabalin Anxiety, Other (See Comments)   Migraines Migraines Migraines Migraines Migraines Migraines Migraines Migraines Migraines        Medication List        Accurate as of July 16, 2023  4:46 PM. If you have any questions, ask your nurse or doctor.          STOP taking these medications    cyclobenzaprine 5 MG tablet Commonly known as: FLEXERIL Stopped by: Riki Altes   meloxicam 7.5 MG tablet Commonly known as: MOBIC Stopped by: Riki Altes       TAKE these medications    ondansetron 4 MG disintegrating tablet Commonly known as: ZOFRAN-ODT DISSOLVE ONE TABLET BY MOUTH EVERY EIGHT HOURS AS NEEDED.   topiramate 200 MG tablet Commonly known as: TOPAMAX TAKE TWO TABLETS (400 MG TOTAL) BY MOUTH DAILY. COURTESY REFILL DUE FOR PCP APPT        Allergies:  Allergies  Allergen Reactions   Prozac [Fluoxetine Hcl] Other (See Comments)    High dose caused seizures.   Estrogens Other (See Comments)    DVT on BCPs  Gabapentin Other (See Comments)    May have made moody and maybe increased migraine frequency. "May have made moody and maybe increased migraine frequency" May have made moody and maybe increased migraine frequency. May have made moody and maybe increased migraine frequency.   Iron Hives   Pregabalin Anxiety and Other (See Comments)    Migraines Migraines Migraines Migraines  Migraines  Migraines Migraines Migraines Migraines    Family History: Family History  Problem Relation Age of Onset   Diabetes Mother    Hypertension Mother    Diabetes Father    Hypertension Father    Migraines Sister    Endometriosis Sister    Migraines Brother    Diabetes Paternal  Grandmother    Lung cancer Maternal Grandmother    Lung cancer Maternal Grandfather     Social History:  reports that she quit smoking about 6 years ago. Her smoking use included cigarettes. She started smoking about 23 years ago. She has a 13.6 pack-year smoking history. She has never used smokeless tobacco. She reports that she does not currently use alcohol after a past usage of about 1.0 standard drink of alcohol per week. She reports that she does not use drugs.   Physical Exam: BP 123/81 (BP Location: Left Arm, Patient Position: Sitting, Cuff Size: Normal)   Pulse 85   Ht 5\' 1"  (1.549 m)   Wt 153 lb (69.4 kg)   BMI 28.91 kg/m   Constitutional:  Alert, No acute distress. HEENT: [place] AT Respiratory: Normal respiratory effort, no increased work of breathing. Psychiatric: Normal mood and affect.   Pertinent Imaging: CT scan February 2024 and KUB performed today were personally reviewed and interpreted. The KUB shows no calcification suspicious for urinary tract stones. Multiple pelvic phleboliths are present.   CT  EXAM: CT ABDOMEN AND PELVIS WITH CONTRAST   TECHNIQUE: Multidetector CT imaging of the abdomen and pelvis was performed using the standard protocol following bolus administration of intravenous contrast.   RADIATION DOSE REDUCTION: This exam was performed according to the departmental dose-optimization program which includes automated exposure control, adjustment of the mA and/or kV according to patient size and/or use of iterative reconstruction technique.   CONTRAST:  OMNIPAQUE IOHEXOL 300 MG/ML  SOLN   COMPARISON:  CT abdomen pelvis dated 06/01/2022.   FINDINGS: Lower chest: The visualized lung bases are clear.   No intra-abdominal free air or free fluid.   Hepatobiliary: The liver is unremarkable. No biliary dilatation. Cholecystectomy. No retained calcified stone noted in the central CBD.   Pancreas: Unremarkable. No pancreatic ductal  dilatation or surrounding inflammatory changes.   Spleen: Normal in size without focal abnormality.   Adrenals/Urinary Tract: The adrenal glands unremarkable. Subcentimeter right renal upper pole hypodense focus is too small to characterize. There is no hydronephrosis on either side. The visualized ureters appear unremarkable. The urinary bladder is collapsed.   Stomach/Bowel: There is moderate stool throughout the colon. There is no bowel obstruction or active inflammation. The appendix is normal.   Vascular/Lymphatic: The abdominal aorta and IVC are unremarkable. No portal venous gas. There is no adenopathy.   Reproductive: Hysterectomy. A septated cyst or 2 adjacent cysts in the right ovary measuring up to 4.7 cm. Several small left ovarian cysts. No follow-up imaging.   Other: None   Musculoskeletal: No acute or significant osseous findings.   IMPRESSION: 1. No acute intra-abdominal or pelvic pathology. 2. Moderate colonic stool burden. No bowel obstruction. Normal appendix. 3. A septated cyst or 2  adjacent cysts in the right ovary measuring up to 4.7 cm.     Electronically Signed   By: Elgie Collard M.D.   On: 10/28/2022 00:22   Assessment & Plan:    1. Personal history urinary calculi No evidence recurrent stone.  Follow up one year.   I have reviewed the above documentation for accuracy and completeness, and I agree with the above.   Riki Altes, MD  [facility] [address] [place], [place] [address] [phone]

## 2023-07-18 ENCOUNTER — Encounter: Payer: Self-pay | Admitting: Urology

## 2023-07-19 ENCOUNTER — Ambulatory Visit: Payer: Self-pay | Admitting: *Deleted

## 2023-07-19 ENCOUNTER — Ambulatory Visit (INDEPENDENT_AMBULATORY_CARE_PROVIDER_SITE_OTHER): Payer: Commercial Managed Care - PPO | Admitting: Family Medicine

## 2023-07-19 ENCOUNTER — Telehealth: Payer: Self-pay | Admitting: Physician Assistant

## 2023-07-19 VITALS — BP 110/73 | HR 70 | Ht 61.0 in | Wt 156.0 lb

## 2023-07-19 DIAGNOSIS — G9050 Complex regional pain syndrome I, unspecified: Secondary | ICD-10-CM

## 2023-07-19 DIAGNOSIS — K3184 Gastroparesis: Secondary | ICD-10-CM | POA: Diagnosis not present

## 2023-07-19 NOTE — Telephone Encounter (Signed)
Referral Request - Did the patient discuss referral with their provider in the last year? yes URGENT/pt had referral previously but couldn't go because she had to heal from surgery   Type of order/referral and detailed reason for visit: pain and vomiting  Preference of office, provider, location: [facility]  Can we respond through MyChart? yes

## 2023-07-19 NOTE — Progress Notes (Signed)
Established patient visit   Patient: Michele Estrada   DOB: 10-25-1970   52 y.o. Female  MRN: [id number] Visit Date: 07/19/2023  Today's healthcare provider: Mila Merry, MD    Subjective    Discussed the use of AI scribe software for clinical note transcription with the patient, who gave verbal consent to proceed.  History of Present Illness   The patient, previously diagnosed with gastroparesis, CRPS, and EPI, presents with ongoing stomach issues. She reports a history of complex and complicated medical conditions, which she attributes to the onset of CRPS in 2017. Since then, she has experienced a cascade of health issues, including gastroparesis and EPI, diagnosed at [facility].  The patient's current primary concern is severe gastroparesis symptoms, which have been exacerbated over the past two weeks due to stress. She reports living off broth, water, tea, and occasional Rice Krispie treats, all of which she either vomits or causes severe diarrhea. She describes her stomach as bloated and expresses a preference for not having a stomach due to the severity of her symptoms.  In addition to her stomach issues, the patient also reports having had female issues, which have since been resolved following a hysterectomy and the removal of her ovary in September. She notes that her recovery period was expected to be four to six weeks.  The patient has been managing her CRPS with topiramate and a device that she sleeps with at night, which she says has helped her significantly. She reports that she was bedridden two years ago, but the device has helped her get out of bed.  The patient has been seeking a referral to [facility] for her gastroparesis, as she was told by previously GI doctor at [facility] does not understand her complex health issues and the relationship of her CRPS and gastroparesis. She expresses frustration with the delay in getting the referral and the impact it is having on her  health. She is eager to see a specialist who understands her condition and can provide appropriate treatment.       Medications: Outpatient Medications Prior to Visit  Medication Sig   ondansetron (ZOFRAN-ODT) 4 MG disintegrating tablet DISSOLVE ONE TABLET BY MOUTH EVERY EIGHT HOURS AS NEEDED.   topiramate (TOPAMAX) 200 MG tablet TAKE TWO TABLETS (400 MG TOTAL) BY MOUTH DAILY. COURTESY REFILL DUE FOR PCP APPT   No facility-administered medications prior to visit.   Review of Systems     Objective    BP 110/73 (BP Location: Left Arm, Patient Position: Sitting, Cuff Size: Normal)   Pulse 70   Ht 5\' 1"  (1.549 m)   Wt 156 lb (70.8 kg)   SpO2 98%   BMI 29.48 kg/m   Physical Exam  General appearance: Well developed, well nourished female, cooperative and in no acute distress Head: Normocephalic, without obvious abnormality, atraumatic Respiratory: Respirations even and unlabored, normal respiratory rate Extremities: All extremities are intact.  Skin: Skin color, texture, turgor normal. No rashes seen  Psych: Appropriate mood and affect. Neurologic: Mental status: Alert, oriented to person, place, and time, thought content appropriate.   Assessment & Plan        Gastroparesis Severe symptoms including vomiting and bloating after eating, currently on a diet of broth, water, and occasional soft foods. Previously diagnosed with gastroparesis and Exocrine Pancreatic Insufficiency (EPI). Patient has a history of Complex Regional Pain Syndrome (CRPS) which is believed to be contributing to the gastroparesis. -Urgent referral to [facility] for further  management. -Continue Zofran as needed for nausea and vomiting.  Complex Regional Pain Syndrome (CRPS) Patient reports managing symptoms with Topiramate and a vagus nerve stimulator. No current need for pain management specialist or additional interventions. -Continue current management with Topiramate and vagus nerve stimulator.     No follow-ups on file.      Mila Merry, MD  [facility] [phone] (phone) [phone] (fax)  [facility]

## 2023-07-19 NOTE — Telephone Encounter (Signed)
Reason for Disposition  [1] Vomiting AND [2] abdomen looks much more swollen than usual  Answer Assessment - Initial Assessment Questions 1. LOCATION: "Where does it hurt?"      I'm having abd pain and vomiting on going.   Been going on for the last 2 weeks.   I'm vomiting.    I had my ovaries out 2 months ago Sept 30.    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    That causes gastroparesis.    2. RADIATION: "Does the pain shoot anywhere else?" (e.g., chest, back)     Abd pain.   I'm in a flare up. 3. ONSET: "When did the pain begin?" (e.g., minutes, hours or days ago)      2 weeks. 4. SUDDEN: "Gradual or sudden onset?"     Not asked 5. PATTERN "Does the pain come and go, or is it constant?"    - If it comes and goes: "How long does it last?" "Do you have pain now?"     (Note: Comes and goes means the pain is intermittent. It goes away completely between bouts.)    - If constant: "Is it getting better, staying the same, or getting worse?"      (Note: Constant means the pain never goes away completely; most serious pain is constant and gets worse.)      Pain 6. SEVERITY: "How bad is the pain?"  (e.g., Scale 1-10; mild, moderate, or severe)    - MILD (1-3): Doesn't interfere with normal activities, abdomen soft and not tender to touch.     - MODERATE (4-7): Interferes with normal activities or awakens from sleep, abdomen tender to touch.     - SEVERE (8-10): Excruciating pain, doubled over, unable to do any normal activities.       Vomiting daily for 2 weeks. 7. RECURRENT SYMPTOM: "Have you ever had this type of stomach pain before?" If Yes, ask: "When was the last time?" and "What happened that time?"      Yes   I have a gastroparesis.   Gastroparesis 8. CAUSE: "What do you think is causing the stomach pain?"     Above 9. RELIEVING/AGGRAVATING FACTORS: "What makes it better or worse?" (e.g., antacids, bending or twisting motion, bowel movement)     Nothing 10. OTHER SYMPTOMS: "Do you have any other symptoms?"  (e.g., back pain, diarrhea, fever, urination pain, vomiting)       diarrhea 11. PREGNANCY: "Is there any chance you are pregnant?" "When was your last menstrual period?"       No  Protocols used: Abdominal Pain - [facility]

## 2023-07-19 NOTE — Telephone Encounter (Signed)
  Chief Complaint: Vomiting for 2 weeks due to CRPS (Complex Regional Pain Syndrome) and gastroparesis Symptoms: Abd pain and vomiting.   CRPS flare up Frequency: For 2 weeks now Pertinent Negatives: Patient denies N/A Disposition: [] ED /[] Urgent Care (no appt availability in office) / [x] Appointment(In office/virtual)/ []  [place] [facility]/ [] Home Care/ [] Refused Recommended Disposition /[] [place] [facility]/ []  Follow-up with PCP Additional Notes: Appt made with Dr. Sherrie Mustache for today at 11:20.

## 2023-07-27 ENCOUNTER — Other Ambulatory Visit: Payer: Self-pay | Admitting: Physician Assistant

## 2023-07-27 DIAGNOSIS — G9050 Complex regional pain syndrome I, unspecified: Secondary | ICD-10-CM

## 2023-08-02 ENCOUNTER — Telehealth: Payer: Self-pay | Admitting: Physician Assistant

## 2023-08-02 ENCOUNTER — Encounter: Payer: Self-pay | Admitting: Family Medicine

## 2023-08-02 NOTE — Telephone Encounter (Signed)
I spoke to pt to let her know that per [facility] they do have referral . They state referral is still being processed They are working on referrals from Oct

## 2023-08-02 NOTE — Telephone Encounter (Signed)
Pt is calling in because she had been working with Maralyn Sago regarding a referral. Pt says a few weeks have gone by and she hasn't heard anything from [place]. Pt is requesting a call back regarding this matter. Pt says it is okay to leave a message if she doesn't answer. Please follow up with pt.

## 2023-08-08 ENCOUNTER — Emergency Department
Admission: EM | Admit: 2023-08-08 | Discharge: 2023-08-08 | Disposition: A | Discharge status: Home / Self Care | Payer: Commercial Managed Care - PPO | Attending: Emergency Medicine | Admitting: Emergency Medicine

## 2023-08-08 ENCOUNTER — Other Ambulatory Visit: Payer: Self-pay

## 2023-08-08 DIAGNOSIS — K3184 Gastroparesis: Secondary | ICD-10-CM | POA: Insufficient documentation

## 2023-08-08 DIAGNOSIS — R109 Unspecified abdominal pain: Secondary | ICD-10-CM | POA: Diagnosis present

## 2023-08-08 DIAGNOSIS — R1084 Generalized abdominal pain: Secondary | ICD-10-CM

## 2023-08-08 LAB — COMPREHENSIVE METABOLIC PANEL
ALT: 13 U/L (ref 0–44)
AST: 16 U/L (ref 15–41)
Albumin: 3.9 g/dL (ref 3.5–5.0)
Alkaline Phosphatase: 36 U/L — ABNORMAL LOW (ref 38–126)
Anion gap: 4 — ABNORMAL LOW (ref 5–15)
BUN: 9 mg/dL (ref 6–20)
CO2: 22 mmol/L (ref 22–32)
Calcium: 8.6 mg/dL — ABNORMAL LOW (ref 8.9–10.3)
Chloride: 116 mmol/L — ABNORMAL HIGH (ref 98–111)
Creatinine, Ser: 1.09 mg/dL — ABNORMAL HIGH (ref 0.44–1.00)
GFR, Estimated: >60 mL/min (ref >60)
Glucose, Bld: 97 mg/dL (ref 70–99)
Potassium: 3.3 mmol/L — ABNORMAL LOW (ref 3.5–5.1)
Sodium: 142 mmol/L (ref 135–145)
Total Bilirubin: 0.4 mg/dL (ref <1.2)
Total Protein: 6.8 g/dL (ref 6.5–8.1)

## 2023-08-08 LAB — URINALYSIS, ROUTINE W REFLEX MICROSCOPIC
Bilirubin Urine: NEGATIVE
Glucose, UA: NEGATIVE mg/dL
Hgb urine dipstick: NEGATIVE
Ketones, ur: NEGATIVE mg/dL
Leukocytes,Ua: NEGATIVE
Nitrite: NEGATIVE
Protein, ur: NEGATIVE mg/dL
Specific Gravity, Urine: 1.018 (ref 1.005–1.030)
pH: 6 (ref 5.0–8.0)

## 2023-08-08 LAB — TROPONIN I (HIGH SENSITIVITY)
Troponin I (High Sensitivity): 3 ng/L (ref <18)
Troponin I (High Sensitivity): 4 ng/L (ref <18)

## 2023-08-08 LAB — CBC
HCT: 33.6 % — ABNORMAL LOW (ref 36.0–46.0)
Hemoglobin: 11.3 g/dL — ABNORMAL LOW (ref 12.0–15.0)
MCH: 30.7 pg (ref 26.0–34.0)
MCHC: 33.6 g/dL (ref 30.0–36.0)
MCV: 91.3 fL (ref 80.0–100.0)
Platelets: 190 10*3/uL (ref 150–400)
RBC: 3.68 MIL/uL — ABNORMAL LOW (ref 3.87–5.11)
RDW: 12.1 % (ref 11.5–15.5)
WBC: 5.7 10*3/uL (ref 4.0–10.5)
nRBC: 0 % (ref 0.0–0.2)

## 2023-08-08 LAB — LIPASE, BLOOD: Lipase: 28 U/L (ref 11–51)

## 2023-08-08 MED ORDER — ONDANSETRON HCL 4 MG/2ML IJ SOLN
4.0000 mg | Freq: Once | INTRAMUSCULAR | Status: AC
  Administered 2023-08-08: 4 mg via INTRAVENOUS
  Filled 2023-08-08: qty 2

## 2023-08-08 MED ORDER — MORPHINE SULFATE (PF) 4 MG/ML IV SOLN
4.0000 mg | Freq: Once | INTRAVENOUS | Status: AC
  Administered 2023-08-08: 4 mg via INTRAVENOUS
  Filled 2023-08-08: qty 1

## 2023-08-08 MED ORDER — LACTATED RINGERS IV BOLUS
1000.0000 mL | Freq: Once | INTRAVENOUS | Status: AC
  Administered 2023-08-08: 1000 mL via INTRAVENOUS

## 2023-08-08 MED ORDER — HYDROMORPHONE HCL 1 MG/ML IJ SOLN
1.0000 mg | Freq: Once | INTRAMUSCULAR | Status: AC
  Administered 2023-08-08: 1 mg via INTRAVENOUS
  Filled 2023-08-08: qty 1

## 2023-08-08 MED ORDER — DICYCLOMINE HCL 10 MG PO CAPS: 10.0000 mg | ORAL_CAPSULE | Freq: Three times a day (TID) | ORAL | 0 refills | Status: DC

## 2023-08-08 MED ORDER — DICYCLOMINE HCL 10 MG PO CAPS
10.0000 mg | ORAL_CAPSULE | Freq: Once | ORAL | Status: AC
  Administered 2023-08-08: 10 mg via ORAL
  Filled 2023-08-08: qty 1

## 2023-08-08 NOTE — ED Triage Notes (Signed)
Pt to ED via POV from home. Pt reports believes she is having a gastroparesis flare up. Pt reports has been having epigastric pain, right shoulder pain and right sided CP. Pt reports yesterday she started vomiting up blood and having black tarry stools.

## 2023-08-08 NOTE — ED Provider Notes (Signed)
[facility] Provider Note    Event Date/Time   First MD Initiated Contact with Patient 08/08/23 1540     (approximate)   History   Abdominal Pain and Chest Pain   HPI  Michele Estrada is a 52 y.o. female with history of CRPS, seizures, gastroparesis, migraines, anxiety presents emergency department with abdominal pain, bloating, vomiting blood and having blood in her stools.  States blood in stools is darker.  Bright red when she vomits.  Patient states the symptoms have been ongoing for about a month.  Has a referral into [facility] but they have not called her yet.  States she does not feel like she is withholding any nutrition.  Is concerned that she is becoming dehydrated.  States drink a half a Dr. Reino Kent earlier today and has a ton of bloating in the abdomen.      Physical Exam   Triage Vital Signs: ED Triage Vitals  Encounter Vitals Group     BP 08/08/23 1537 (!) 140/80     Systolic BP Percentile --      Diastolic BP Percentile --      Pulse Rate 08/08/23 1537 82     Resp 08/08/23 1537 20     Temp 08/08/23 1537 98.1 F (36.7 C)     Temp Source 08/08/23 1537 Oral     SpO2 08/08/23 1537 94 %     Weight --      Height --      Head Circumference --      Peak Flow --      Pain Score 08/08/23 1538 10     Pain Loc --      Pain Education --      Exclude from Growth Chart --     Most recent vital signs: Vitals:   08/08/23 1910 08/08/23 2000  BP: 129/79 135/82  Pulse: 62 65  Resp: 18 16  Temp:    SpO2: 100% 100%     General: Awake, no distress.   CV:  Good peripheral perfusion. regular rate and  rhythm Resp:  Normal effort. Lungs cta Abd:  No distention.  Mildly tender in all 4 quads Other:      ED Results / Procedures / Treatments   Labs (all labs ordered are listed, but only abnormal results are displayed) Labs Reviewed  COMPREHENSIVE METABOLIC PANEL - Abnormal; Notable for the following components:      Result Value    Potassium 3.3 (*)    Chloride 116 (*)    Creatinine, Ser 1.09 (*)    Calcium 8.6 (*)    Alkaline Phosphatase 36 (*)    Anion gap 4 (*)    All other components within normal limits  CBC - Abnormal; Notable for the following components:   RBC 3.68 (*)    Hemoglobin 11.3 (*)    HCT 33.6 (*)    All other components within normal limits  URINALYSIS, ROUTINE W REFLEX MICROSCOPIC - Abnormal; Notable for the following components:   Color, Urine YELLOW (*)    APPearance CLEAR (*)    All other components within normal limits  LIPASE, BLOOD  TROPONIN I (HIGH SENSITIVITY)  TROPONIN I (HIGH SENSITIVITY)     EKG  EKG   RADIOLOGY     PROCEDURES:   Procedures   MEDICATIONS ORDERED IN ED: Medications  lactated ringers bolus 1,000 mL (0 mLs Intravenous Stopped 08/08/23 1908)  morphine (PF) 4 MG/ML injection 4 mg (4  mg Intravenous Given 08/08/23 1703)  ondansetron (ZOFRAN) injection 4 mg (4 mg Intravenous Given 08/08/23 1703)  HYDROmorphone (DILAUDID) injection 1 mg (1 mg Intravenous Given 08/08/23 1911)  dicyclomine (BENTYL) capsule 10 mg (10 mg Oral Given 08/08/23 1911)     IMPRESSION / MDM / ASSESSMENT AND PLAN / ED COURSE  I reviewed the triage vital signs and the nursing notes.                              Differential diagnosis includes, but is not limited to, gastroparesis, bowel obstruction, GI bleed, pancreatitis, colitis  Patient's presentation is most consistent with acute illness / injury with system symptoms.   Labs, fluids, nausea medicine ordered  Will assess labs prior to ordering any CTs as this does appear to be a chronic problem.   Labs are reassuring  Patient is feeling somewhat better after morphine but still having pain and spasms.  Will give her Dilaudid 1 mg IV and Bentyl p.o.  Patient had relief with these medications.  Therefore will not do CT.  She is to follow-up with the specialist at [facility] that she is waiting for an appointment for her.  Did send  a prescription for Bentyl to the her pharmacy.  Return emergency department worsening.  She is in agreement treatment plan.  Discharged stable condition.   FINAL CLINICAL IMPRESSION(S) / ED DIAGNOSES   Final diagnoses:  Generalized abdominal pain  Gastroparesis     Rx / DC Orders   ED Discharge Orders          Ordered    dicyclomine (BENTYL) 10 MG capsule  3 times daily before meals & bedtime        08/08/23 1957             Note:  This document was prepared using Dragon voice recognition software and may include unintentional dictation errors.    Faythe Ghee, PA-C 08/08/23 2008    Minna Antis, MD 08/10/23 [phone]

## 2023-08-16 ENCOUNTER — Ambulatory Visit (INDEPENDENT_AMBULATORY_CARE_PROVIDER_SITE_OTHER): Payer: Commercial Managed Care - PPO | Admitting: Family Medicine

## 2023-08-16 ENCOUNTER — Encounter: Payer: Self-pay | Admitting: Family Medicine

## 2023-08-16 VITALS — BP 126/68 | HR 75 | Temp 98.3°F | Ht 61.0 in | Wt 150.4 lb

## 2023-08-16 DIAGNOSIS — G9050 Complex regional pain syndrome I, unspecified: Secondary | ICD-10-CM

## 2023-08-16 DIAGNOSIS — F332 Major depressive disorder, recurrent severe without psychotic features: Secondary | ICD-10-CM | POA: Diagnosis not present

## 2023-08-16 DIAGNOSIS — R569 Unspecified convulsions: Secondary | ICD-10-CM

## 2023-08-16 DIAGNOSIS — K8681 Exocrine pancreatic insufficiency: Secondary | ICD-10-CM

## 2023-08-16 DIAGNOSIS — K3184 Gastroparesis: Secondary | ICD-10-CM | POA: Diagnosis not present

## 2023-08-16 MED ORDER — SUCRALFATE 1 G PO TABS: 1.0000 g | ORAL_TABLET | Freq: Three times a day (TID) | ORAL | 11 refills | Status: DC

## 2023-08-16 MED ORDER — PANTOPRAZOLE SODIUM 40 MG PO TBEC: 40.0000 mg | DELAYED_RELEASE_TABLET | Freq: Two times a day (BID) | ORAL | 3 refills | Status: DC

## 2023-08-16 MED ORDER — PANCRELIPASE (LIP-PROT-AMYL) 12000-38000 UNITS PO CPEP: ORAL_CAPSULE | ORAL | 11 refills | Status: DC

## 2023-08-16 NOTE — Assessment & Plan Note (Signed)
Chronic, declined forms today however, visibly upset regarding ongoing chronic health needs Denies SI/SH; reports 4 reasons to live husband, daughter IL/son, grandchild     03/10/2023    3:05 PM 10/28/2022   11:05 AM 03/12/2022    4:15 PM  PHQ9 SCORE ONLY  PHQ-9 Total Score 8 0 4      10/28/2022   11:06 AM  GAD 7 : Generalized Anxiety Score  Nervous, Anxious, on Edge 1  Control/stop worrying 1  Worry too much - different things 1  Trouble relaxing 1  Restless 3  Easily annoyed or irritable 0  Afraid - awful might happen 1  Total GAD 7 Score 8  Anxiety Difficulty Not difficult at all

## 2023-08-16 NOTE — Assessment & Plan Note (Signed)
Chronic, not on AED Reports previous seizures >20 years ago Followed by neuro Remains hypervigilant regarding medication use due to risk for recurrent neurologic episode

## 2023-08-16 NOTE — Assessment & Plan Note (Addendum)
Referred to [facility]  Chronic, uncontrolled Recent ED visit; trial of bentyl and zofran- reports ongoing concerns for malnutrition with hair and teeth falling out/breaking off Request to be seen by GI with CRPS understanding to assist with start of TPN  Has been declined at [facility] and [facility] Trial of PPI and carafate to assist Reports previous intolerance to reglan

## 2023-08-16 NOTE — Assessment & Plan Note (Signed)
Chronic, reports resolved Previous reports she was dong much better since using pulse electromagnetic device (OSKA) Declines repeat creon given resolution

## 2023-08-16 NOTE — Progress Notes (Signed)
Established patient visit   Patient: Michele Estrada   DOB: Apr 10, 1971   52 y.o. Female  MRN: [id number] Visit Date: 08/16/2023  Today's healthcare provider: Jacky Kindle, FNP  Re Introduced to nurse practitioner role and practice setting.  All questions answered.  Discussed provider/patient relationship and expectations.  Subjective    HPI HPI   Vomiting/diarrhea--had referral at [facility]-denied due second opinion. Last edited by Shelly Bombard, CMA on 08/16/2023  1:14 PM.      Medications: Outpatient Medications Prior to Visit  Medication Sig   dicyclomine (BENTYL) 10 MG capsule Take 1 capsule (10 mg total) by mouth 4 (four) times daily -  before meals and at bedtime.   ondansetron (ZOFRAN-ODT) 4 MG disintegrating tablet DISSOLVE ONE TABLET BY MOUTH EVERY EIGHT HOURS AS NEEDED.   topiramate (TOPAMAX) 200 MG tablet TAKE TWO TABLETS (400 MG TOTAL) BY MOUTH DAILY. COURTESY REFILL DUE FOR PCP APPT   No facility-administered medications prior to visit.   Last CBC Lab Results  Component Value Date   WBC 5.7 08/08/2023   HGB 11.3 (L) 08/08/2023   HCT 33.6 (L) 08/08/2023   MCV 91.3 08/08/2023   MCH 30.7 08/08/2023   RDW 12.1 08/08/2023   PLT 190 08/08/2023   Last metabolic panel Lab Results  Component Value Date   GLUCOSE 97 08/08/2023   NA 142 08/08/2023   K 3.3 (L) 08/08/2023   CL 116 (H) 08/08/2023   CO2 22 08/08/2023   BUN 9 08/08/2023   CREATININE 1.09 (H) 08/08/2023   GFRNONAA >60 08/08/2023   CALCIUM 8.6 (L) 08/08/2023   PHOS 3.2 09/09/2016   PROT 6.8 08/08/2023   ALBUMIN 3.9 08/08/2023   LABGLOB 2.6 10/13/2022   AGRATIO 1.9 10/13/2022   BILITOT 0.4 08/08/2023   ALKPHOS 36 (L) 08/08/2023   AST 16 08/08/2023   ALT 13 08/08/2023   ANIONGAP 4 (L) 08/08/2023   Last lipids Lab Results  Component Value Date   CHOL 169 10/06/2021   HDL 43 10/06/2021   LDLCALC 110 (H) 10/06/2021   TRIG 86 10/06/2021   CHOLHDL 3.9 10/06/2021   Last hemoglobin A1c Lab  Results  Component Value Date   HGBA1C 5.2 10/06/2021   Last thyroid functions Lab Results  Component Value Date   TSH 1.840 01/22/2020   Last vitamin B12 and Folate Lab Results  Component Value Date   VITAMINB12 890 06/22/2019     Objective    BP 126/68 (BP Location: Left Arm, Patient Position: Sitting, Cuff Size: Normal)   Pulse 75   Temp 98.3 F (36.8 C)   Ht 5\' 1"  (1.549 m)   Wt 150 lb 6.4 oz (68.2 kg)   SpO2 97%   BMI 28.42 kg/m   Physical Exam Vitals and nursing note reviewed.  Constitutional:      General: She is not in acute distress.    Appearance: Normal appearance. She is overweight. She is not ill-appearing, toxic-appearing or diaphoretic.  HENT:     Head: Normocephalic and atraumatic.  Cardiovascular:     Rate and Rhythm: Normal rate and regular rhythm.     Pulses: Normal pulses.     Heart sounds: Normal heart sounds. No murmur heard.    No friction rub. No gallop.  Pulmonary:     Effort: Pulmonary effort is normal. No respiratory distress.     Breath sounds: Normal breath sounds. No stridor. No wheezing, rhonchi or rales.  Chest:  Chest wall: No tenderness.  Abdominal:     Comments: Reports chronic pain, cramping, bloating, nausea, vomiting and diarrhea with 'significant weight loss'  Musculoskeletal:        General: No swelling, tenderness, deformity or signs of injury. Normal range of motion.     Right lower leg: No edema.     Left lower leg: No edema.  Skin:    General: Skin is warm and dry.     Capillary Refill: Capillary refill takes less than 2 seconds.     Coloration: Skin is not jaundiced or pale.     Findings: No bruising, erythema, lesion or rash.  Neurological:     General: No focal deficit present.     Mental Status: She is alert and oriented to person, place, and time. Mental status is at baseline.     Cranial Nerves: No cranial nerve deficit.     Sensory: No sensory deficit.     Motor: No weakness.     Coordination:  Coordination normal.  Psychiatric:        Mood and Affect: Mood is anxious. Affect is labile and blunt.        Behavior: Behavior is agitated.        Thought Content: Thought content normal.        Judgment: Judgment normal.     Comments: Pt expressed frustration as 'her life is on the line' given chronic GI concerns and concern for malnutrition given GI symptoms      No results found for any visits on 08/16/23.  Assessment & Plan     Problem List Items Addressed This Visit       Digestive   Exocrine pancreatic insufficiency    Chronic, reports resolved Previous reports she was dong much better since using pulse electromagnetic device (OSKA) Declines repeat creon given resolution        Gastroparesis - Primary    Referred to [facility]  Chronic, uncontrolled Recent ED visit; trial of bentyl and zofran- reports ongoing concerns for malnutrition with hair and teeth falling out/breaking off Request to be seen by GI with CRPS understanding to assist with start of TPN  Has been declined at [facility] and [facility] Trial of PPI and carafate to assist Reports previous intolerance to reglan      Relevant Orders   Amb ref to Medical Nutrition Therapy-MNT   Ambulatory referral to Gastroenterology     Nervous and Auditory   Complex regional pain syndrome I   Relevant Orders   Amb ref to Medical Nutrition Therapy-MNT   Ambulatory referral to Gastroenterology     Other   MDD (major depressive disorder), recurrent episode, severe (HCC)    Chronic, declined forms today however, visibly upset regarding ongoing chronic health needs Denies SI/SH; reports 4 reasons to live husband, daughter [place]/son, grandchild     03/10/2023    3:05 PM 10/28/2022   11:05 AM 03/12/2022    4:15 PM  PHQ9 SCORE ONLY  PHQ-9 Total Score 8 0 4      10/28/2022   11:06 AM  GAD 7 : Generalized Anxiety Score  Nervous, Anxious, on Edge 1  Control/stop worrying 1  Worry too much - different  things 1  Trouble relaxing 1  Restless 3  Easily annoyed or irritable 0  Afraid - awful might happen 1  Total GAD 7 Score 8  Anxiety Difficulty Not difficult at all          Relevant  Orders   Amb ref to Medical Nutrition Therapy-MNT   Seizure (HCC)    Chronic, not on AED Reports previous seizures >20 years ago Followed by neuro Remains hypervigilant regarding medication use due to risk for recurrent neurologic episode       Relevant Orders   Amb ref to Medical Nutrition Therapy-MNT   Return in about 4 weeks (around 09/13/2023), or if symptoms worsen or fail to improve.     Leilani Merl, FNP, have reviewed all documentation for this visit. The documentation on 08/16/23 for the exam, diagnosis, procedures, and orders are all accurate and complete.  Jacky Kindle, FNP  [facility] [phone] (phone) [phone] (fax)  [facility]

## 2023-08-20 ENCOUNTER — Telehealth: Payer: Self-pay | Admitting: Physician Assistant

## 2023-08-20 NOTE — Telephone Encounter (Signed)
Pt is calling to report that  Nutrition none none [facility]  Has been denied.  Pt would like referral to be placed to [facility].  [address],  [place], [place] [address]   [phone] [phone]  Provider- Dr. Charna Elizabeth

## 2023-08-23 ENCOUNTER — Encounter: Payer: Self-pay | Admitting: Physician Assistant

## 2023-08-24 ENCOUNTER — Telehealth: Payer: Self-pay | Admitting: Physician Assistant

## 2023-08-24 NOTE — Telephone Encounter (Signed)
Lisa from [facility] called stated patient was seen by [facility] in 2022 so Dr Loreta Ave would not be able to see her

## 2023-08-25 ENCOUNTER — Telehealth: Payer: Self-pay

## 2023-08-25 NOTE — Telephone Encounter (Signed)
Copied from CRM [id number]. Topic: Referral - Request for Referral >> Aug 25, 2023 11:33 AM Phill Myron wrote: Dr Rochel Brome [phone] or [phone] Address [address], [address] [place], [place] [address]    Pt Newman Pies had requested her gastroparesis information to be sent to Dr Gery Pray for review  her appt is scheduled for Sep 16, 2023 .Marland Kitchenplease advise

## 2023-08-26 NOTE — Telephone Encounter (Signed)
They are needing any records from [facility] regarding gastroparesis.  She already has an appointment 09/16/2023 and they do not need a referral.  Just the records from our office about gastroparesis.

## 2023-08-27 NOTE — Telephone Encounter (Signed)
Pt reports she has an appointment in January with someone in [place]. She reports no referral was needed and she will let us know if they need anything come January.

## 2023-09-07 ENCOUNTER — Other Ambulatory Visit: Payer: Self-pay | Admitting: Physician Assistant

## 2023-09-07 DIAGNOSIS — R112 Nausea with vomiting, unspecified: Secondary | ICD-10-CM

## 2023-09-07 DIAGNOSIS — G9050 Complex regional pain syndrome I, unspecified: Secondary | ICD-10-CM

## 2023-09-11 NOTE — Telephone Encounter (Signed)
 Requested Prescriptions  Pending Prescriptions Disp Refills   topiramate  (TOPAMAX ) 200 MG tablet [Pharmacy Med Name: TOPIRAMATE  200MG  TABLET] 90 tablet 0    Sig: Take 1 tablet (200 mg total) by mouth daily.     Neurology: Anticonvulsants - topiramate  & zonisamide Failed - 09/11/2023 11:45 AM      Failed - Cr in normal range and within 360 days    Creatinine  Date Value Ref Range Status  06/06/2013 1.13 0.60 - 1.30 mg/dL Final   Creat  Date Value Ref Range Status  04/04/2013 0.92 0.50 - 1.10 mg/dL Final   Creatinine, Ser  Date Value Ref Range Status  08/08/2023 1.09 (H) 0.44 - 1.00 mg/dL Final         Passed - CO2 in normal range and within 360 days    CO2  Date Value Ref Range Status  08/08/2023 22 22 - 32 mmol/L Final   Co2  Date Value Ref Range Status  06/06/2013 25 21 - 32 mmol/L Final         Passed - ALT in normal range and within 360 days    ALT  Date Value Ref Range Status  08/08/2023 13 0 - 44 U/L Final   SGPT (ALT)  Date Value Ref Range Status  06/06/2013 23 12 - 78 U/L Final         Passed - AST in normal range and within 360 days    AST  Date Value Ref Range Status  08/08/2023 16 15 - 41 U/L Final   SGOT(AST)  Date Value Ref Range Status  06/06/2013 24 15 - 37 Unit/L Final         Passed - Completed PHQ-2 or PHQ-9 in the last 360 days      Passed - Valid encounter within last 12 months    Recent Outpatient Visits           3 weeks ago Gastroparesis   [facility] Emilio Marseille T, FNP   1 month ago Complex regional pain syndrome type 1, affecting unspecified site   [facility] Gasper Nancyann BRAVO, MD   4 months ago Complex regional pain syndrome type 1 affecting other site   [facility] [place], [place], PA-C   6 months ago Complex regional pain syndrome type 1 of right lower extremity   [facility] [place], [place], PA-C   10 months ago Ovarian  cyst, right   [facility] Cyndi Shaver, PA-C       Future Appointments             In 10 months Stoioff, Glendia BROCKS, MD [facility] 

## 2023-09-15 NOTE — Progress Notes (Signed)
 Chief Complaint:  nausea    HPI: The patient is a 53 year old female with a past medical history of chronic regional pain syndrome presenting for evaluation of nausea and vomiting.  The patient states that she has had symptoms for years.  She states that she has constant nausea, and will have emesis of undigested food.  She has generalized abdominal pain that is intermittent and poorly described.  She states that she has had chronic reflux and takes omeprazole 20 mg daily.  She has not been able to tolerate omeprazole 40 mg daily as she develops side effects.  She states that she has had difficulty swallowing both solids and liquids.  Her stools fluctuate, and she can have days of constipation followed by days of loose stool.  She has intermittent blood in her stool.  She states that she previously had weight loss, but her weight is now stable.    The patient states that she was previously followed by GI in both [place] and [place].  She believes that she had an EGD and colonoscopy in the past, but does not know timing or results.  She states that she was diagnosed with gastroparesis in the past and treated with Reglan .  She was not able to tolerate this medicine.  She states that she was also diagnosed with pancreatic insufficiency in the past and treated with Creon .  She also did not tolerate this medicine.  She denies family history of colon cancer.   Past Medical History:  Diagnosis Date  . Chronic migraine with aura   . CRPS (complex regional pain syndrome type I)   . Endometriosis   . Functional neurologic complaint   . Gastroparesis     Past Surgical History:   has a past surgical history that includes Hysterectomy; Total abdominal hysterectomy w/ bilateral salpingoophorectomy; Cholecystectomy (2000); Wrist surgery; Shoulder surgery; and Spinal cord stimulator removal.  Social History:   reports that she quit smoking about 6 years ago. Her smoking use included cigarettes. She has  never used smokeless tobacco. She reports that she does not drink alcohol and does not use drugs.  Family History:  family history includes Glaucoma in her mother; Hypertension in her mother; Inflammatory bowel disease in her paternal aunt; Migraines in her brother and sister.  Allergies  Allergen Reactions  . Fluoxetine Other and Seizures    High dose caused seizures.  High dose caused seizures  . Estrogens Other and Unknown    DVT on BCPs  . Gabapentin  Other    May have made moody and maybe increased migraine frequency.  May have made moody and maybe increased migraine frequency    May have made moody and maybe increased migraine frequency.    May have made moody and maybe increased migraine frequency. May have made moody and maybe increased migraine frequency May have made moody and maybe increased migraine frequency. May have made moody and maybe increased migraine frequency.  . Iron Hives and Other    hives  . Pregabalin Anxiety and Other    Migraines    Migraines  Migraines Migraines Migraines Migraines    Migraines Migraines Migraines Migraines  Migraines  Migraines Migraines Migraines Migraines    Migraines, , Migraines, Migraines, Migraines, Migraines       Medication Sig Dispense Refill  . omeprazole (PRILOSEC) 20 mg capsule Take one capsule (20 mg dose) by mouth 2 (two) times daily before meals for 30 days. 60 capsule 5  . ondansetron  (ZOFRAN ) 4 mg tablet  Take one tablet (4 mg dose) by mouth every 8 (eight) hours as needed for Nausea.    . topiramate  (TOPAMAX ) 200 MG tablet Take one tablet (200 mg dose) by mouth 2 (two) times daily.     No current facility-administered medications for this visit.    Review of Systems:   Review of Systems  Constitutional: Negative.   HENT: Negative.    Eyes: Negative.   Respiratory: Negative.    Cardiovascular: Negative.   Gastrointestinal:  Positive for abdominal pain, blood in stool, constipation, diarrhea, nausea and  vomiting.  Endocrine: Negative.   Genitourinary: Negative.   Musculoskeletal: Negative.   Skin: Negative.   Allergic/Immunologic: Negative.   Neurological: Negative.   Hematological: Negative.   Psychiatric/Behavioral: Negative.      Physical Exam: Chaperone: Felicia Clark BP 140/86 (BP Location: Right Upper Arm, Patient Position: Sitting)   Pulse 65   Temp 97.7 F (36.5 C) (Temporal)   Wt 145 lb (65.8 kg)  Physical Exam Constitutional:      Appearance: She is well-developed.  HENT:     Head: Normocephalic and atraumatic.  Cardiovascular:     Rate and Rhythm: Normal rate and regular rhythm.     Heart sounds: Normal heart sounds.  Musculoskeletal:        General: Normal range of motion.     Cervical back: Normal range of motion and neck supple.  Pulmonary:     Effort: Pulmonary effort is normal.     Breath sounds: Normal breath sounds. No wheezing or rales.  Abdominal:     General: Bowel sounds are normal. There is no distension.     Palpations: Abdomen is soft.     Tenderness: There is no abdominal tenderness.  Skin:    General: Skin is warm and dry.  Neurological:     Mental Status: She is alert and oriented to person, place, and time.        Impression/Plan:  1. Dysphagia, unspecified type  COLONOSCOPY W/BX & EGD W/BX   omeprazole (PRILOSEC) 20 mg capsule    2. Nausea and vomiting, unspecified vomiting type  CBC   Comprehensive Metabolic Panel   TSH   omeprazole (PRILOSEC) 20 mg capsule   TSH   Comprehensive Metabolic Panel   CBC   Cortisol   Tissue Transglutaminase, IgA   IgA   IgA   Tissue Transglutaminase, IgA   Cortisol    3. Rectal bleeding  CBC   COLONOSCOPY W/BX & EGD W/BX   CBC      Discussion and Summary: The patient is a 53 year old female with a past medical history of chronic regional pain syndrome presenting for evaluation of nausea and vomiting.  The patient has had years of symptoms, and states that she has an evaluation that we do  not currently have records.  We will obtain evaluation today including blood work and we will draw a CBC, CMP, TSH, cortisol, and tissue transglutaminase.  We will schedule her for an EGD and colonoscopy in order to assess her difficulty swallowing, nausea, vomiting, fluctuating bowel habits, and blood in stool.  We will have the patient increase her omeprazole to 20 mg twice daily to see if this helps her underlying reflux.  We discussed the gastroparesis diet today.  If no cause of symptoms found, we will obtain a gastric emptying study to evaluate for underlying gastroparesis.  She states that she has been intolerant to Reglan  in the past.  Blood work EGD and colonoscopy Omeprazole 20mg   bid Consider GES based on results Gastroparesis diet  Thank you for asking us  to see Ms. Samaras today.  We will have her return to clinic in 3 months.

## 2023-10-15 NOTE — Progress Notes (Signed)
   Intraprocedure Staff Note:  EGD  Specimen Collection Log  Specimens removed:  Bottle Location #Biopsy/Polyp Comments   [] Esophagus [] GE Junction [] Gastric Fundus [] Gastric Body [] Gastric Antrum [x] Duodenum [] Random Gastric [] Other: bx   2. [] Esophagus [] GE Junction [] Gastric Fundus [] Gastric Body [] Gastric Antrum [] Duodenum [x] Random Gastric [] Other: bx   3. [] Esophagus [x] GE Junction [] Gastric Fundus [] Gastric Body [] Gastric Antrum [] Duodenum [] Random Gastric [] Other: bx   4. [] Esophagus [] GE Junction [] Gastric Fundus [] Gastric Body [] Gastric Antrum [] Duodenum [] Random Gastric [] Other:    5. [] Esophagus [] GE Junction [] Gastric Fundus [] Gastric Body [] Gastric Antrum [] Duodenum [] Other:    6. [] Esophagus [] GE Junction [] Gastric Fundus [] Gastric Body [] Gastric Antrum [] Duodenum [] Random Gastric [] Other:    7. [] Esophagus [] GE Junction [] Gastric Fundus [] Gastric Body [] Gastric Antrum [] Duodenum [] Random Gastric [] Other:    8. [] Esophagus [] GE Junction [] Gastric Fundus [] Gastric Body [] Gastric Antrum [] Duodenum [] Random Gastric [] Other:    9. [] Esophagus [] GE Junction [] Gastric Fundus [] Gastric Body [] Gastric Antrum [] Duodenum [] Random Gastric [] Other:    10. [] Esophagus [] GE Junction [] Gastric Fundus [] Gastric Body [] Gastric Antrum [] Duodenum [] Random Gastric [] Other:

## 2023-10-15 NOTE — Progress Notes (Signed)
   Discharge:  Patient evaluated in recovery and deemed stable following procedure and sedation. Patient being discharged per physician's orders.  All questions and concerns have been addressed.

## 2023-10-15 NOTE — Progress Notes (Signed)
 HPI:  Michele Estrada is a 53 y.o. (06/14/1971) female presenting today for Colonoscopy and EGD.      Patient Active Problem List  Diagnosis  . Abnormal MRI of head  . Absolute anemia  . Antiphospholipid syndrome (*)  . Bulging lumbar disc  . Cannot sleep  . Cocaine abuse (*)  . Chronic pain of right upper extremity  . Chronic pain syndrome  . Chronic pelvic pain in female  . Complex regional pain syndrome I  . Complex regional pain syndrome type 1 of right lower extremity  . Reflex sympathetic dystrophy  . Compulsive tobacco user syndrome  . Contact dermatitis  . Costal chondritis  . Exocrine pancreatic insufficiency (*)  . Falls frequently  . Family history of diabetes mellitus  . Female cystocele  . Functional movement disorder  . 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 Gastroparesis  . 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 History of bilateral salpingo-oophorectomy (BSO)  . History of deep venous thrombosis  . History of endometriosis  . History of hysterectomy for benign disease  . Left ovarian cyst  . Clinical depression     Past Medical History:  Diagnosis Date  . Chronic migraine with aura   . CRPS (complex regional pain syndrome type I)   . Endometriosis   . Functional neurologic complaint   . Gastroparesis     Past Surgical History:   has a past surgical history that includes Hysterectomy; Total abdominal hysterectomy w/ bilateral salpingoophorectomy; Cholecystectomy (2000); Wrist surgery; Shoulder surgery; Spinal cord stimulator removal; and Colonoscopy.  Allergies  Allergen Reactions  . Fluoxetine Other and Seizures    High dose caused seizures.  High dose caused seizures  . Estrogens Other and Unknown    DVT on BCPs  . Gabapentin  Other    May have made moody and maybe increased migraine frequency.  May have made moody and maybe increased migraine frequency    May have made  moody and maybe increased migraine frequency.    May have made moody and maybe increased migraine frequency. May have made moody and maybe increased migraine frequency May have made moody and maybe increased migraine frequency. May have made moody and maybe increased migraine frequency.  . Iron Hives and Other    hives  . Pregabalin Anxiety and Other    Migraines    Migraines  Migraines Migraines Migraines Migraines    Migraines Migraines Migraines Migraines  Migraines  Migraines Migraines Migraines Migraines    Migraines, , Migraines, Migraines, Migraines, Migraines       Medication Sig Dispense Refill  . omeprazole (PRILOSEC) 20 mg capsule Take one capsule (20 mg dose) by mouth 2 (two) times daily before meals for 30 days. 60 capsule 5  . ondansetron  (ZOFRAN ) 4 mg tablet Take one tablet (4 mg dose) by mouth every 8 (eight) hours as needed for Nausea.    . topiramate  (TOPAMAX ) 200 MG tablet Take one tablet (200 mg dose) by mouth 2 (two) times daily.     No current facility-administered medications for this visit.     Physical Exam:  General:  Alert and oriented, no acute distress Chest:  Clear to auscultation bilaterally CV:  Regular rhythm, normal rate, no murmurs, rubs, or gallops Abd:  Good audible bowel sounds, soft, nontender, no masses, no hepatosplenomegaly  Impression/Plan:   1. Dysphagia, unspecified type  COLONOSCOPY W/BX & EGD W/BX    2. Rectal bleeding  COLONOSCOPY W/BX & EGD W/BX       I have reviewed and agree with the CRNA's assessment of Mallampati and ASA Score.  Patient remains an appropriate candidate for planned anesthesia, colonoscopy, and EGD.   Potential risks and benefits of colonoscopy and EGD were discussed with the patient to include but not limited to bleeding, infection, perforation, rectal irritation, cardiac, pulmonary, neurologic complications, aspiration, and dental damage. In a small number of cases, significant lesions could be missed.  Patient expressed understanding and agrees to proceed with both procedures.  We will proceed with colonoscopy and EGD as planned.

## 2023-10-15 NOTE — Progress Notes (Signed)
   [facility] Approved [facility] Standing Orders   Pre-Procedure Orders: [x] Obtain signatures on the consent forms. [x] Initiate an IV and establish saline lock unless patient meets IV (0.9% Normal Saline) therapy criteria as stated below: Patient presents with signs/symptoms of dehydration (i.e. dizziness, headache, poor skin turgor, poor IV access) Difficult IV stick with 2 or more unsuccessful attempts Patient age >77 with ASA score of 3 per MD or CRNA assessment Recommendation of MD [x] Obtain pre-procedure assessment, vital signs, and Aldrete Score and inform physician of findings outside of normal limits. [x] Initiate 2 Liters Oxygen if oxygen saturation falls <92%. [x] Check blood glucose levels and notify physician and patient of results.  If blood sugar is < 70, initiate IV fluids of D5 Normal Saline at [facility].   Intra-Procedure Orders: [x] Monitor vital signs every 5 minutes and oxygen saturations level continuously with deep sedation CO2. [x] Administer sedation medications per verbal orders.  These medications must be documented in ProVation note. [x] Apply supplemental oxygen 2 Liters nasal cannula for all procedures requiring sedation. *Note:  See anesthesia record for anesthesia administered medications.  Post-Procedure Orders: [x] Bed rest until awake and alert. [x] Vital Signs and Aldrete Score every 15 minutes until meeting discharge criteria.  Last set of vital signs to include temperature. [x] Initiate 2 Liter of oxygen if oxygen saturation falls < 92%. [x] When alert and talking, provide fluids by mouth as tolerated. [x] Remove IV when vital signs are stable. [x] Check blood glucose level on insulin dependent diabetic prior to discharge. [x] Discharge with responsible adult when patient is awake, alert, and meets discharge criteria. [x] Provide verbal and written discharge instructions to patient and responsible adult.  PRN Orders: [] Administer IV Fluids, Rate KVO/Wide Open [] 0.9% Normal  Saline [] 0.45% Normal Saline [] D5 Normal Saline [] D5W [] Lactated Ringers  [] Zofran  4 mg IVP [] Zofran  8 mg IVP [] Perform pregnancy test for suspicion of pregnancy [] Fleets enema if needed [] Transfer patient to the Emergency Room via EMS for evaluation [] Suction oral secretions as needed [] Insert rectal tube for patient complaining of abdominal pain with no flatus [] Other ___________________________________________________________________ [x] Approved by MD

## 2023-11-12 ENCOUNTER — Other Ambulatory Visit: Payer: Self-pay | Admitting: Physician Assistant

## 2023-11-12 DIAGNOSIS — R112 Nausea with vomiting, unspecified: Secondary | ICD-10-CM

## 2023-11-15 ENCOUNTER — Other Ambulatory Visit: Payer: Self-pay | Admitting: Physician Assistant

## 2023-11-15 DIAGNOSIS — G9050 Complex regional pain syndrome I, unspecified: Secondary | ICD-10-CM

## 2023-11-15 NOTE — Telephone Encounter (Signed)
 Copied from CRM [id number]. Topic: Clinical - Medication Refill >> Nov 15, 2023 12:00 PM Truddie Crumble wrote: Most Recent Primary Care Visit:  Provider: Merita Norton T  Department: [id number]  Visit Type: OFFICE VISIT  Date: 08/16/2023  Medication: topiramate (TOPAMAX) 200 MG tablet  Has the patient contacted their pharmacy? Yes (Agent: If no, request that the patient contact the pharmacy for the refill. If patient does not wish to contact the pharmacy document the reason why and proceed with request.) (Agent: If yes, when and what did the pharmacy advise?)  Is this the correct pharmacy for this prescription? Yes If no, delete pharmacy and type the correct one.  This is the patient's preferred pharmacy:  [facility] - [place], [place] - [address] [place] [address] Phone: [phone] Fax: [phone]   Has the prescription been filled recently? Yes  Is the patient out of the medication? Yes  Has the patient been seen for an appointment in the last year OR does the patient have an upcoming appointment? Yes  Can we respond through MyChart? Yes  Agent: Please be advised that Rx refills may take up to 3 business days. We ask that you follow-up with your pharmacy.

## 2023-11-15 NOTE — Telephone Encounter (Unsigned)
 Copied from CRM [id number]. Topic: Clinical - Medication Refill >> Nov 15, 2023 12:00 PM Truddie Crumble wrote: Most Recent Primary Care Visit:  Provider: Merita Norton T  Department: [id number]  Visit Type: OFFICE VISIT  Date: 08/16/2023  Medication: topiramate (TOPAMAX) 200 MG tablet  Has the patient contacted their pharmacy? Yes (Agent: If no, request that the patient contact the pharmacy for the refill. If patient does not wish to contact the pharmacy document the reason why and proceed with request.) (Agent: If yes, when and what did the pharmacy advise?)  Is this the correct pharmacy for this prescription? Yes If no, delete pharmacy and type the correct one.  This is the patient's preferred pharmacy:  [facility] - [place], [place] - [address] [place] [address] Phone: [phone] Fax: [phone]   Has the prescription been filled recently? Yes  Is the patient out of the medication? Yes  Has the patient been seen for an appointment in the last year OR does the patient have an upcoming appointment? Yes  Can we respond through MyChart? Yes  Agent: Please be advised that Rx refills may take up to 3 business days. We ask that you follow-up with your pharmacy.

## 2023-11-16 ENCOUNTER — Telehealth: Payer: Self-pay | Admitting: Physician Assistant

## 2023-11-16 ENCOUNTER — Other Ambulatory Visit: Payer: Self-pay | Admitting: Physician Assistant

## 2023-11-16 ENCOUNTER — Telehealth: Payer: Self-pay

## 2023-11-16 DIAGNOSIS — G9050 Complex regional pain syndrome I, unspecified: Secondary | ICD-10-CM

## 2023-11-16 MED ORDER — TOPIRAMATE 200 MG PO TABS: 200.0000 mg | ORAL_TABLET | Freq: Every day | ORAL | 1 refills | Status: DC

## 2023-11-16 MED ORDER — TOPIRAMATE 200 MG PO TABS
200.0000 mg | ORAL_TABLET | Freq: Every day | ORAL | 1 refills | Status: DC
Start: 2023-11-16 — End: 2023-11-17

## 2023-11-16 NOTE — Telephone Encounter (Signed)
 Please see the message below.

## 2023-11-16 NOTE — Telephone Encounter (Signed)
 Routing to clinic for review  Copied from CRM [id number]. Topic: Clinical - Prescription Issue >> Nov 16, 2023  4:25 PM Michele Estrada wrote: Reason for CRM: Patient prescription is messed up and needs to be corrected. topiramate (TOPAMAX) 200 MG tablet should be 400 MG instead. Please call patient when this has been updated.  Callback #: [phone]

## 2023-11-16 NOTE — Telephone Encounter (Signed)
 Patient is requesting prescription refill topiramate (TOPAMAX) 200 MG tablet  Please advise

## 2023-11-16 NOTE — Telephone Encounter (Signed)
 Copied from CRM [id number]. Topic: Clinical - Medication Refill >> Nov 16, 2023  2:01 PM Everette C wrote: Most Recent Primary Care Visit:  Provider: Merita Norton T  Department: [id number]  Visit Type: OFFICE VISIT  Date: 08/16/2023  Medication: topiramate (TOPAMAX) 400 MG tablet   The patient has stressed the importance of the medication being 400 MG   Has the patient contacted their pharmacy? Yes (Agent: If no, request that the patient contact the pharmacy for the refill. If patient does not wish to contact the pharmacy document the reason why and proceed with request.) (Agent: If yes, when and what did the pharmacy advise?)  Is this the correct pharmacy for this prescription? Yes If no, delete pharmacy and type the correct one.  This is the patient's preferred pharmacy:  [facility] - [place], [place] - [address] [place] [address] Phone: [phone] Fax: [phone]   Has the prescription been filled recently? Yes  Is the patient out of the medication? Yes  Has the patient been seen for an appointment in the last year OR does the patient have an upcoming appointment? Yes  Can we respond through MyChart? No  Agent: Please be advised that Rx refills may take up to 3 business days. We ask that you follow-up with your pharmacy.

## 2023-11-16 NOTE — Telephone Encounter (Signed)
 Copied from CRM [id number]. Topic: Clinical - Prescription Issue >> Nov 15, 2023 11:55 AM Truddie Crumble wrote: Reason for CRM: patient called stating her topiramate got changed from 400mg  to 200mg  and now she is out of medication at [facility] pharmacy. Patient was not paying attention to the change of medication because it was a 90 day refill

## 2023-11-17 ENCOUNTER — Other Ambulatory Visit: Payer: Self-pay | Admitting: Physician Assistant

## 2023-11-17 DIAGNOSIS — G9050 Complex regional pain syndrome I, unspecified: Secondary | ICD-10-CM

## 2023-11-17 MED ORDER — TOPIRAMATE 200 MG PO TABS: 200.0000 mg | ORAL_TABLET | Freq: Two times a day (BID) | ORAL | 1 refills | Status: DC

## 2023-11-17 NOTE — Telephone Encounter (Signed)
 Requested medication (s) are due for refill today:   Provider to review     Requested medication (s) are on the active medication list:   Yes  Future visit scheduled:   No    LOV was with Merita Norton on 08/16/2023 but looks like Debera Lat, PA-C is now ordering this.   Last ordered: There are multiple orders for this.   Not sure but looks like pt saying dose is 400 mg but provider is writing for 200 mg.   This needs to be clarified.    See communication in chart regarding this dosage.    Not sure what it is supposed to be.   200 mg or 400 mg?  Pt is out of medication and requesting to be notified when this is corrected.     Requested Prescriptions  Pending Prescriptions Disp Refills   topiramate (TOPAMAX) 200 MG tablet [Pharmacy Med Name: TOPIRAMATE 200MG  TABLET] 90 tablet 1    Sig: TAKE ONE TABLET (200 MG TOTAL) BY MOUTH DAILY.     Neurology: Anticonvulsants - topiramate & zonisamide Failed - 11/17/2023 12:08 PM      Failed - Cr in normal range and within 360 days    Creatinine  Date Value Ref Range Status  06/06/2013 1.13 0.60 - 1.30 mg/dL Final   Creat  Date Value Ref Range Status  04/04/2013 0.92 0.50 - 1.10 mg/dL Final   Creatinine, Ser  Date Value Ref Range Status  08/08/2023 1.09 (H) 0.44 - 1.00 mg/dL Final         Passed - CO2 in normal range and within 360 days    CO2  Date Value Ref Range Status  08/08/2023 22 22 - 32 mmol/L Final   Co2  Date Value Ref Range Status  06/06/2013 25 21 - 32 mmol/L Final         Passed - ALT in normal range and within 360 days    ALT  Date Value Ref Range Status  08/08/2023 13 0 - 44 U/L Final   SGPT (ALT)  Date Value Ref Range Status  06/06/2013 23 12 - 78 U/L Final         Passed - AST in normal range and within 360 days    AST  Date Value Ref Range Status  08/08/2023 16 15 - 41 U/L Final   SGOT(AST)  Date Value Ref Range Status  06/06/2013 24 15 - 37 Unit/L Final         Passed - Completed PHQ-2 or PHQ-9 in the  last 360 days      Passed - Valid encounter within last 12 months    Recent Outpatient Visits           3 months ago Gastroparesis   [facility] Merita Norton T, FNP   4 months ago Complex regional pain syndrome type 1, affecting unspecified site   [facility] Malva Limes, MD   6 months ago Complex regional pain syndrome type 1 affecting other site   [facility] Earle, [place], PA-C   8 months ago Complex regional pain syndrome type 1 of right lower extremity   [facility] [place], [place], PA-C   1 year ago Ovarian cyst, right   [facility] Alfredia Ferguson, PA-C       Future Appointments             In 8 months Stoioff,  Verna Czech, MD [facility]

## 2023-12-07 ENCOUNTER — Ambulatory Visit (INDEPENDENT_AMBULATORY_CARE_PROVIDER_SITE_OTHER): Admitting: Physician Assistant

## 2023-12-07 ENCOUNTER — Encounter: Payer: Self-pay | Admitting: Physician Assistant

## 2023-12-07 VITALS — BP 109/72 | HR 93 | Ht 61.0 in | Wt 137.6 lb

## 2023-12-07 DIAGNOSIS — R21 Rash and other nonspecific skin eruption: Secondary | ICD-10-CM

## 2023-12-07 DIAGNOSIS — R1110 Vomiting, unspecified: Secondary | ICD-10-CM | POA: Diagnosis not present

## 2023-12-07 DIAGNOSIS — R110 Nausea: Secondary | ICD-10-CM | POA: Diagnosis not present
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
## 2024-01-10 ENCOUNTER — Encounter: Payer: Self-pay | Admitting: Allergy & Immunology

## 2024-01-10 ENCOUNTER — Ambulatory Visit (INDEPENDENT_AMBULATORY_CARE_PROVIDER_SITE_OTHER): Admitting: Allergy & Immunology

## 2024-01-10 VITALS — BP 108/70 | HR 79 | Temp 97.8°F | Resp 18 | Ht 61.0 in | Wt 135.4 lb

## 2024-01-10 DIAGNOSIS — K529 Noninfective gastroenteritis and colitis, unspecified: Secondary | ICD-10-CM | POA: Diagnosis not present

## 2024-01-10 DIAGNOSIS — J3489 Other specified disorders of nose and nasal sinuses: Secondary | ICD-10-CM | POA: Diagnosis not present

## 2024-01-10 DIAGNOSIS — R140 Abdominal distension (gaseous): Secondary | ICD-10-CM

## 2024-01-10 MED ORDER — CETIRIZINE HCL 10 MG PO TABS: 10.0000 mg | ORAL_TABLET | Freq: Two times a day (BID) | ORAL | 5 refills | Status: DC
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
## 2024-01-10 NOTE — Patient Instructions (Addendum)
 1. Chronic diarrhea and bloating - We are going to do a workup for mast cell disease to rule this out.  - This involves urine and blood work. - We will call you in 1-2 weeks with the results of the testing.  - Leukotriene E4, 24 hr, U - N-Methylhistamine, 24 Hr, U - Prostaglandin D2/Creatinine, U - Alpha-Gal Panel - IgE - Prostaglandin D2, serum - ANA, IFA (with reflex) - Sedimentation rate - C-reactive protein - Rheumatoid factor - Try taking a Zyrtec (cetirizine) 10mg  TWICE DAILY to try to see if chronic antihistamine use will help with the symptoms. - We can do selected food testing at the next visit (food sheet provided, 1-17 looks at 95% of all of the food allergies). - We can always add on Xolair into the regimen to see if this can lead to increased food tolerance.   2. Contact dermatitis due to plants - Continue to let your husband do the yard work!   3. Rhinorrhea - We will do environmental allergy testing via the blood to see is there is an environmental trigger causing these symptoms.  - The twice daily antihistamines should help with the runny nose. - Nose sprays declined.   4. Return in about 2 weeks (around 01/24/2024) for FOOD TESTING. You can have the follow up appointment with Dr. Idolina Maker or a Nurse Practicioner (our Nurse Practitioners are excellent and always have Physician oversight!).    Please inform us  of any Emergency Department visits, hospitalizations, or changes in symptoms. Call us  before going to the ED for breathing or allergy symptoms since we might be able to fit you in for a sick visit. Feel free to contact us  anytime with any questions, problems, or concerns.  It was a pleasure to meet you today!  Websites that have reliable patient information: 1. American Academy of Asthma, Allergy, and Immunology: www.aaaai.org 2. Food Allergy Research and Education (FARE): foodallergy.org 3. Mothers of Asthmatics: http://www.asthmacommunitynetwork.org 4.  American College of Allergy, Asthma, and Immunology: www.acaai.org      "Like" us  on Facebook and Instagram for our latest updates!      A healthy democracy works best when [employer] participate! Make sure you are registered to vote! If you have moved or changed any of your contact information, you will need to get this updated before voting! Scan the QR codes below to learn more!

## 2024-01-11 ENCOUNTER — Encounter: Payer: Self-pay | Admitting: Allergy & Immunology

## 2024-01-13 LAB — ALLERGENS W/COMP RFLX AREA 2
Alternaria Alternata IgE: <0.1 kU/L
Aspergillus Fumigatus IgE: <0.1 kU/L
Bermuda Grass IgE: <0.1 kU/L
Cedar, Mountain IgE: <0.1 kU/L
Cladosporium Herbarum IgE: <0.1 kU/L
Cockroach, German IgE: <0.1 kU/L
Common Silver Birch IgE: <0.1 kU/L
Cottonwood IgE: <0.1 kU/L
D Farinae IgE: <0.1 kU/L
D Pteronyssinus IgE: <0.1 kU/L
E001-IgE Cat Dander: <0.1 kU/L
E005-IgE Dog Dander: <0.1 kU/L
Elm, American IgE: <0.1 kU/L
IgE (Immunoglobulin E), Serum: 11 [IU]/mL (ref 6–495)
Johnson Grass IgE: <0.1 kU/L
Maple/Box Elder IgE: <0.1 kU/L
Mouse Urine IgE: <0.1 kU/L
Oak, White IgE: <0.1 kU/L
Pecan, Hickory IgE: <0.1 kU/L
Penicillium Chrysogen IgE: <0.1 kU/L
Pigweed, Rough IgE: <0.1 kU/L
Ragweed, Short IgE: <0.1 kU/L
Sheep Sorrel IgE Qn: <0.1 kU/L
Timothy Grass IgE: <0.1 kU/L
White Mulberry IgE: <0.1 kU/L

## 2024-01-22 LAB — C-REACTIVE PROTEIN: CRP: <1 mg/L (ref 0–10)

## 2024-01-22 LAB — N-METHYLHISTAMINE, 24 HR, U
Collection Duration (h): 24 h
Creatinine Concent. 24 Hr, U: 59 mg/dL
Creatinine, 24 Hour, U: 944 mg/(24.h) (ref 603–1783)
N-Methylhistamine, 24 Hr, U: 256 ug/g{creat} — ABNORMAL HIGH (ref 30–200)
Urine Volume (mL): 1600 mL

## 2024-01-22 LAB — LEUKOTRIENE E4, 24 HR, U
Collection Duration: 24 h
Creatinine Concentration,24 HR: 62 mg/dL
Creatinine, 24 HR, U: 992 mg/(24.h) (ref 603–1783)
Leukotriene E4, U: 74 pg/mg{creat} (ref <=104)
Urine Volume: 1600 mL

## 2024-01-22 LAB — PROSTAGLANDIN D2/CREATININE, U
Creatinine, Urine: 62 mg/dL
Prostaglandin D2, urine: 8.7 pg/mL
Prostaglandin D2/Cr Ratio: 14 ng/g

## 2024-01-22 LAB — ALPHA-GAL PANEL
Allergen Lamb IgE: <0.1 kU/L
Beef IgE: <0.1 kU/L
IgE (Immunoglobulin E), Serum: 9 [IU]/mL (ref 6–495)
O215-IgE Alpha-Gal: <0.1 kU/L
Pork IgE: <0.1 kU/L

## 2024-01-22 LAB — SEDIMENTATION RATE: Sed Rate: 2 mm/h (ref 0–40)

## 2024-01-22 LAB — RHEUMATOID FACTOR: Rheumatoid fact SerPl-aCnc: <10 [IU]/mL (ref <14.0)

## 2024-01-22 LAB — PROSTAGLANDIN D2, SERUM: Prostaglandin D2, serum: 4.7 pg/mL

## 2024-01-22 LAB — ANTINUCLEAR ANTIBODIES, IFA: ANA Titer 1: NEGATIVE

## 2024-01-25 ENCOUNTER — Ambulatory Visit: Admitting: Allergy & Immunology

## 2024-01-26 ENCOUNTER — Ambulatory Visit: Payer: Self-pay | Admitting: Allergy & Immunology

## 2024-02-02 ENCOUNTER — Other Ambulatory Visit: Payer: Self-pay | Admitting: *Deleted

## 2024-02-02 ENCOUNTER — Other Ambulatory Visit: Payer: Self-pay | Admitting: Physician Assistant

## 2024-02-02 ENCOUNTER — Other Ambulatory Visit (INDEPENDENT_AMBULATORY_CARE_PROVIDER_SITE_OTHER)

## 2024-02-02 ENCOUNTER — Telehealth: Payer: Self-pay | Admitting: *Deleted

## 2024-02-02 ENCOUNTER — Encounter: Payer: Self-pay | Admitting: Allergy

## 2024-02-02 ENCOUNTER — Ambulatory Visit (INDEPENDENT_AMBULATORY_CARE_PROVIDER_SITE_OTHER): Admitting: Allergy

## 2024-02-02 DIAGNOSIS — D649 Anemia, unspecified: Secondary | ICD-10-CM

## 2024-02-02 DIAGNOSIS — G9050 Complex regional pain syndrome I, unspecified: Secondary | ICD-10-CM

## 2024-02-02 DIAGNOSIS — R140 Abdominal distension (gaseous): Secondary | ICD-10-CM

## 2024-02-02 DIAGNOSIS — K529 Noninfective gastroenteritis and colitis, unspecified: Secondary | ICD-10-CM | POA: Diagnosis not present

## 2024-02-02 MED ORDER — TOPIRAMATE 200 MG PO TABS: 200.0000 mg | ORAL_TABLET | Freq: Two times a day (BID) | ORAL | 1 refills | Status: DC

## 2024-02-02 MED ORDER — EPINEPHRINE 0.3 MG/0.3ML IJ SOAJ: 0.3000 mg | INTRAMUSCULAR | 0 refills | Status: DC | PRN

## 2024-02-02 NOTE — Telephone Encounter (Signed)
 Heidi Llamas, Please review below and advise regarding appt / refills.  Copied from CRM [id number]. Topic: Clinical - Medication Question >> Feb 02, 2024  9:40 AM Baldo Levan wrote: Reason for CRM: Patient is calling in asking if she needs blood work done and if this can be ordered so that she can get a refill on her topiramate  (TOPAMAX ) 200 MG tablet [[id number]]. Please advise patient

## 2024-02-02 NOTE — Progress Notes (Signed)
 Follow-up Note  RE: Michele Estrada MRN: [id number] DOB: March 13, 1971 Date of Office Visit: 02/02/2024   History of present illness: Michele Estrada is a 53 y.o. female presenting today for skin testing visit.  She was last seen in the office on 01/10/24 for chronic diarrhea, contact dermatitis and rhinorrhea.  She is in her usual state of health today without recent illness.  She has held antihistamines for at least 3 days for testing today.   Medication List: Current Outpatient Medications  Medication Sig Dispense Refill   cetirizine  (ZYRTEC ) 10 MG tablet Take 1 tablet (10 mg total) by mouth 2 (two) times daily. 60 tablet 5   omeprazole (PRILOSEC) 20 MG capsule Take 20 mg by mouth 2 (two) times daily.     ondansetron  (ZOFRAN -ODT) 4 MG disintegrating tablet DISSOLVE ONE TABLET BY MOUTH EVERY EIGHT HOURS AS NEEDED. 60 tablet 2   topiramate  (TOPAMAX ) 200 MG tablet Take 1 tablet (200 mg total) by mouth 2 (two) times daily. 90 tablet 1   No current facility-administered medications for this visit.     Known medication allergies: Allergies  Allergen Reactions   Prozac [Fluoxetine Hcl] Other (See Comments)    High dose caused seizures.   Estrogens Other (See Comments)    DVT on BCPs   Gabapentin  Other (See Comments)    May have made moody and maybe increased migraine frequency. "May have made moody and maybe increased migraine frequency" May have made moody and maybe increased migraine frequency. May have made moody and maybe increased migraine frequency.   Iron Hives   Pregabalin Anxiety and Other (See Comments)    Migraines Migraines Migraines Migraines  Migraines  Migraines Migraines Migraines Migraines    Diagnostics/Labs:  Allergy testing:   Food Adult Perc - 02/02/24 0800     Time Antigen Placed 4098    Allergen Manufacturer Floyd Hutchinson    Location Back    Number of allergen test 24     Control-buffer 50% Glycerol Negative    Control-Histamine 2+    1. Peanut  Negative    2. Soybean 2+   5x8   3. Wheat Negative    4. Sesame Negative    5. Milk, Cow Negative    6. Casein Negative    7. Egg White, Chicken 2+   5x6   8. Shellfish Mix 2+   6x6   9. Fish Mix Negative    10. Cashew Negative    11. Walnut Food Negative    12. Almond Negative    13. Hazelnut Negative    14. Pecan Food Negative    15. Pistachio Negative    16. [place] Nut Negative    17. Coconut Negative    35. Pork Negative    36. Beef Negative    41. Pea, Green/English 2+   6x8   42. Navy Bean 2+   6x8   52. Corn 2+   6x8            Allergy testing results were read and interpreted by provider, documented by clinical staff.   Assessment and plan:   1. Chronic diarrhea and bloating - Dr Idolina Maker performed a workup for mast cell disease to rule this out at your visit on 01/10/24 and he reported the results as following:  elevated mast cell metabolite in your urine called N-methylhistamine. 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  - Try taking a Zyrtec  (cetirizine ) 10mg  TWICE DAILY to try to  see if chronic antihistamine use will help with the symptoms. - We can always add on Xolair into the regimen to see if this can lead to increased food tolerance.   Select food allergy testing performed today is positive to soybean, egg, shellfish mix, green pea, navy bean, corn.   - if you note symptoms following ingestion of these foods then would remove from diet.  If you do not have issues eating these foods then would keep in diet as that would be a sensitivity (see below) - will provide with epinephrine  device (Epipen ) in case of allergic reaction.  Follow emergency action plan in case of allergic reaction.   We have discussed the following in regards to foods:   Allergy: food allergy is when you have eaten a food, developed an allergic reaction after eating the food and have IgE to the food (positive food testing either by skin testing or blood testing).   Food allergy could lead to life threatening symptoms  Sensitivity: occurs when you have IgE to a food (positive food testing either by skin testing or blood testing) but is a food you eat without any issues.  This is not an allergy and we recommend keeping the food in the diet  Intolerance: this is when you have negative testing by either skin testing or blood testing thus not allergic but the food causes symptoms (like belly pain, bloating, diarrhea etc) with ingestion.  These foods should be avoided to prevent symptoms.     2. Contact dermatitis due to plants - Continue to let your husband do the yard work!   3. Rhinorrhea - Environmental allergy testing via the blood was negative. - The twice daily antihistamines should help with the runny nose. - Nose sprays declined.   4. Schedule follow-up visit in 4-8 weeks with Dr Idolina Maker to discuss your testing results and provide update on your symptoms since your visit on 01/10/24.   Return in about 8 weeks (around 03/29/2024).  I appreciate the opportunity to take part in Annabell's care. Please do not hesitate to contact me with questions.  Sincerely,   Catha Clink, MD Allergy/Immunology [facility]

## 2024-02-02 NOTE — Patient Instructions (Addendum)
 1. Chronic diarrhea and bloating - Dr Idolina Maker performed a workup for mast cell disease to rule this out at your visit on 01/10/24 and he reported the results as following:  elevated mast cell metabolite in your urine called N-methylhistamine. 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  - Try taking a Zyrtec  (cetirizine ) 10mg  TWICE DAILY to try to see if chronic antihistamine use will help with the symptoms. - We can always add on Xolair into the regimen to see if this can lead to increased food tolerance.   Select food allergy testing performed today is positive to soybean, egg, shellfish mix, green pea, navy bean, corn.   - if you note symptoms following ingestion of these foods then would remove from diet.  If you do not have issues eating these foods then would keep in diet as that would be a sensitivity (see below) - will provide with epinephrine  device (Epipen ) in case of allergic reaction.  Follow emergency action plan in case of allergic reaction.   We have discussed the following in regards to foods:   Allergy: food allergy is when you have eaten a food, developed an allergic reaction after eating the food and have IgE to the food (positive food testing either by skin testing or blood testing).  Food allergy could lead to life threatening symptoms  Sensitivity: occurs when you have IgE to a food (positive food testing either by skin testing or blood testing) but is a food you eat without any issues.  This is not an allergy and we recommend keeping the food in the diet  Intolerance: this is when you have negative testing by either skin testing or blood testing thus not allergic but the food causes symptoms (like belly pain, bloating, diarrhea etc) with ingestion.  These foods should be avoided to prevent symptoms.     2. Contact dermatitis due to plants - Continue to let your husband do the yard work!   3. Rhinorrhea - Environmental allergy testing via the blood was  negative. - The twice daily antihistamines should help with the runny nose. - Nose sprays declined.   4. Schedule follow-up visit in 4-8 weeks with Dr Idolina Maker to discuss your testing results and provide update on your symptoms since your visit on 01/10/24.

## 2024-02-02 NOTE — Telephone Encounter (Signed)
 Spoke with pt and scheduled lab appt for today at 1:15pm. Refills sent.

## 2024-02-03 ENCOUNTER — Ambulatory Visit: Payer: Self-pay | Admitting: Physician Assistant

## 2024-02-03 DIAGNOSIS — G9050 Complex regional pain syndrome I, unspecified: Secondary | ICD-10-CM

## 2024-02-03 LAB — CBC WITH DIFFERENTIAL/PLATELET
Basophils Absolute: 0.1 10*3/uL (ref 0.0–0.1)
Basophils Relative: 1.4 % (ref 0.0–3.0)
Eosinophils Absolute: 0 10*3/uL (ref 0.0–0.7)
Eosinophils Relative: 0.5 % (ref 0.0–5.0)
HCT: 37.8 % (ref 36.0–46.0)
Hemoglobin: 12.6 g/dL (ref 12.0–15.0)
Lymphocytes Relative: 29.3 % (ref 12.0–46.0)
Lymphs Abs: 2.3 10*3/uL (ref 0.7–4.0)
MCHC: 33.4 g/dL (ref 30.0–36.0)
MCV: 92.4 fl (ref 78.0–100.0)
Monocytes Absolute: 0.4 10*3/uL (ref 0.1–1.0)
Monocytes Relative: 5.8 % (ref 3.0–12.0)
Neutro Abs: 4.9 10*3/uL (ref 1.4–7.7)
Neutrophils Relative %: 63 % (ref 43.0–77.0)
Platelets: 253 10*3/uL (ref 150.0–400.0)
RBC: 4.1 Mil/uL (ref 3.87–5.11)
RDW: 13.6 % (ref 11.5–15.5)
WBC: 7.7 10*3/uL (ref 4.0–10.5)

## 2024-02-03 LAB — IBC + FERRITIN
Ferritin: 54.6 ng/mL (ref 10.0–291.0)
Iron: 46 ug/dL (ref 42–145)
Saturation Ratios: 14.9 % — ABNORMAL LOW (ref 20.0–50.0)
TIBC: 308 ug/dL (ref 250.0–450.0)
Transferrin: 220 mg/dL (ref 212.0–360.0)

## 2024-02-03 LAB — COMPREHENSIVE METABOLIC PANEL WITH GFR
ALT: 23 U/L (ref 0–35)
AST: 19 U/L (ref 0–37)
Albumin: 4.7 g/dL (ref 3.5–5.2)
Alkaline Phosphatase: 63 U/L (ref 39–117)
BUN: 13 mg/dL (ref 6–23)
CO2: 26 meq/L (ref 19–32)
Calcium: 9.7 mg/dL (ref 8.4–10.5)
Chloride: 108 meq/L (ref 96–112)
Creatinine, Ser: 1.08 mg/dL (ref 0.40–1.20)
GFR: 59.92 mL/min — ABNORMAL LOW (ref >60.00)
Glucose, Bld: 79 mg/dL (ref 70–99)
Potassium: 3.9 meq/L (ref 3.5–5.1)
Sodium: 141 meq/L (ref 135–145)
Total Bilirubin: 0.3 mg/dL (ref 0.2–1.2)
Total Protein: 7.1 g/dL (ref 6.0–8.3)

## 2024-02-03 LAB — VITAMIN B12: Vitamin B-12: 193 pg/mL — ABNORMAL LOW (ref 211–911)

## 2024-02-04 ENCOUNTER — Encounter: Payer: Self-pay | Admitting: Physician Assistant

## 2024-02-14 LAB — HM MAMMOGRAPHY

## 2024-03-14 ENCOUNTER — Other Ambulatory Visit: Payer: Self-pay

## 2024-03-14 ENCOUNTER — Ambulatory Visit (INDEPENDENT_AMBULATORY_CARE_PROVIDER_SITE_OTHER): Admitting: Allergy & Immunology

## 2024-03-14 ENCOUNTER — Encounter: Payer: Self-pay | Admitting: Allergy & Immunology

## 2024-03-14 VITALS — BP 120/86 | HR 74 | Temp 97.6°F | Resp 18 | Ht 60.63 in | Wt 136.9 lb

## 2024-03-14 DIAGNOSIS — K529 Noninfective gastroenteritis and colitis, unspecified: Secondary | ICD-10-CM

## 2024-03-14 DIAGNOSIS — D8940 Mast cell activation, unspecified: Secondary | ICD-10-CM | POA: Diagnosis not present

## 2024-03-14 DIAGNOSIS — R140 Abdominal distension (gaseous): Secondary | ICD-10-CM

## 2024-03-14 DIAGNOSIS — L508 Other urticaria: Secondary | ICD-10-CM | POA: Diagnosis not present

## 2024-03-14 NOTE — Patient Instructions (Addendum)
 1. Chronic diarrhea and bloating - with elevated N-Methylhistamine - We are going to continue to work on getting these symptoms under control. - Continue with cetirizine  10mg  twice daily. - Let's add on cromolyn  5 mL 30 minutes before every meal (mast cell stabilizer that can help with GI reactions). - I would ask the pharmacist whether the medication contains corn.  - I would try this for a few weeks and see how it goes. - I would strongly consider doing the Xolair (even though it is an injection), maybe we can pre-medicate with some lidocaine  cream or something.  - Maybe you have chronic hives and you happen to connect them to foods. - Maybe these results were false positives (so you might consider adding on the egg and other items to see if your symptoms get WORSE).  - We will put in a referral for a second opinion at [facility] or [facility] .  2. Non-allergic rhinitis  - Testing was negative in the blood. - Continue with cetirizine  twice daily (try to find a formula that does not contain corn).   3. Return in about 3 months (around 06/14/2024). You can have the follow up appointment with Dr. Iva or a Nurse Practicioner (our Nurse Practitioners are excellent and always have Physician oversight!).    Please inform us  of any Emergency Department visits, hospitalizations, or changes in symptoms. Call us  before going to the ED for breathing or allergy  symptoms since we might be able to fit you in for a sick visit. Feel free to contact us  anytime with any questions, problems, or concerns.  It was a pleasure to meet you today!  Websites that have reliable patient information: 1. American Academy of Asthma, Allergy , and Immunology: www.aaaai.org 2. Food Allergy  Research and Education (FARE): foodallergy.org 3. Mothers of Asthmatics: http://www.asthmacommunitynetwork.org 4. American College of Allergy , Asthma, and Immunology: www.acaai.org      "Like" us  on Facebook and Instagram for our  latest updates!      A healthy democracy works best when [employer] participate! Make sure you are registered to vote! If you have moved or changed any of your contact information, you will need to get this updated before voting! Scan the QR codes below to learn more!
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
## 2024-03-15 ENCOUNTER — Encounter: Payer: Self-pay | Admitting: Allergy & Immunology

## 2024-03-15 MED ORDER — CROMOLYN SODIUM 100 MG/5ML PO CONC: 100.0000 mg | Freq: Three times a day (TID) | ORAL | 5 refills | Status: DC

## 2024-03-17 LAB — KIT (D816V) DIGITAL PCR

## 2024-03-21 ENCOUNTER — Ambulatory Visit: Payer: Self-pay | Admitting: Allergy & Immunology

## 2024-03-21 ENCOUNTER — Emergency Department

## 2024-03-21 ENCOUNTER — Other Ambulatory Visit: Payer: Self-pay

## 2024-03-21 ENCOUNTER — Encounter: Payer: Self-pay | Admitting: Physician Assistant

## 2024-03-21 ENCOUNTER — Emergency Department
Admission: EM | Admit: 2024-03-21 | Discharge: 2024-03-21 | Disposition: A | Discharge status: Home / Self Care | Attending: Emergency Medicine | Admitting: Emergency Medicine

## 2024-03-21 DIAGNOSIS — R519 Headache, unspecified: Secondary | ICD-10-CM | POA: Diagnosis present

## 2024-03-21 NOTE — ED Triage Notes (Signed)
 Pt comes with c/o facial injury. Pt states her puppy bumped her head. Pt states it has now triggered her other health issues and having pain. Pt states she might have broken her nose bc it was bleeding earlier.
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
## 2024-03-21 NOTE — ED Notes (Signed)
 Patient taken to CT scan.

## 2024-03-21 NOTE — ED Provider Notes (Signed)
 [facility] Emergency Department Provider Note     Event Date/Time   First MD Initiated Contact with Patient 03/21/24 1204     (approximate)   History   Facial Pain   HPI  Michele Estrada is a 53 y.o. female with a PMHx of CRPS, functional neurologic symptom, anxiety and migraines presents to the ED for evaluation of facial pain.  Patient reports she was bumped in the face by her newly puppy and this has triggered her complex regional pain syndrome that is localized to her face.  Patient reports she is concerned of a broken nose because she had a nosebleed for 45 minutes that is now controlled.  Denies difficulty breathing.  No other complaint.     Physical Exam   Triage Vital Signs: ED Triage Vitals  Encounter Vitals Group     BP 03/21/24 1151 126/72     Girls Systolic BP Percentile --      Girls Diastolic BP Percentile --      Boys Systolic BP Percentile --      Boys Diastolic BP Percentile --      Pulse Rate 03/21/24 1151 72     Resp 03/21/24 1151 18     Temp 03/21/24 1151 98 F (36.7 C)     Temp src --      SpO2 03/21/24 1151 99 %     Weight 03/21/24 1150 137 lb (62.1 kg)     Height 03/21/24 1150 5' (1.524 m)     Head Circumference --      Peak Flow --      Pain Score 03/21/24 1150 0     Pain Loc --      Pain Education --      Exclude from Growth Chart --     Most recent vital signs: Vitals:   03/21/24 1151 03/21/24 1345  BP: 126/72 103/74  Pulse: 72 71  Resp: 18 16  Temp: 98 F (36.7 C)   SpO2: 99% 100%    General Awake, no distress.  HEENT NCAT.  Nontender facial structures with palpation.  No crepitus or step-off.  PERRL. EOMI. No rhinorrhea. Mucous membranes are moist.  No septal hematoma. CV:  Good peripheral perfusion. RESP:  Normal effort.  ABD:  No distention.     ED Results / Procedures / Treatments   Labs (all labs ordered are listed, but only abnormal results are displayed) Labs Reviewed - No data to  display  RADIOLOGY  I personally viewed and evaluated these images as part of my medical decision making, as well as reviewing the written report by the radiologist.  ED Provider Interpretation: Normal maxillofacial CT  CT Maxillofacial Wo Contrast Result Date: 03/21/2024 CLINICAL DATA:  Provided history: Face pain. EXAM: CT MAXILLOFACIAL WITHOUT CONTRAST TECHNIQUE: Multidetector CT imaging of the maxillofacial structures was performed. Multiplanar CT image reconstructions were also generated. RADIATION DOSE REDUCTION: This exam was performed according to the departmental dose-optimization program which includes automated exposure control, adjustment of the mA and/or kV according to patient size and/or use of iterative reconstruction technique. COMPARISON:  Report from head CT 02/14/2002 (images unavailable). FINDINGS: Osseous: No acute maxillofacial fracture is identified. Orbits: No acute orbital finding. Sinuses: No significant paranasal sinus disease. Soft tissues: No maxillofacial hematoma appreciable by CT. Limited intracranial: No evidence of an acute intracranial abnormality within the field of view. IMPRESSION: No evidence of an acute maxillofacial fracture. Electronically Signed   By: Rockey Childs D.O.  On: 03/21/2024 12:28    PROCEDURES:  Critical Care performed: No  Procedures   MEDICATIONS ORDERED IN ED: Medications - No data to display   IMPRESSION / MDM / ASSESSMENT AND PLAN / ED COURSE  I reviewed the triage vital signs and the nursing notes.                               53 y.o. female presents to the emergency department for evaluation and treatment of acute facial injury. See HPI for further details.   Differential diagnosis includes, but is not limited to fracture, contusion, dislocation, epistaxis  Patient's presentation is most consistent with acute complicated illness / injury requiring diagnostic workup.  Patient presents to the ED for an acute facial injury  after being bumped by her newly puppy.  On physical exam there is no deformity or step-off noted.  Patient's concern is deformity to her nasal bone as she has had a past broken nose.  There is no septal hematoma on exam.  CT maxillofacial obtained in triage is normal.  Reassurance provided to the patient.  Pain medication offered however declined given trigger to CRPS.  Patient is in stable condition for discharge home.  ED precaution discussed.  Encouraged follow-up with PCP.  Patient verbalized understanding is in agreement with this care plan.  FINAL CLINICAL IMPRESSION(S) / ED DIAGNOSES   Final diagnoses:  Facial pain, acute    Rx / DC Orders   ED Discharge Orders     None       Note:  This document was prepared using Dragon voice recognition software and may include unintentional dictation errors.    Margrette, Rudie Rikard A, PA-C 03/21/24 1356    Ernest Ronal BRAVO, MD 03/22/24 [phone]

## 2024-03-29 LAB — KIT (D816V) DIGITAL PCR: CKIT Result: NEGATIVE

## 2024-03-29 LAB — TRYPTASE: Tryptase: 2.7 ug/L (ref 2.2–13.2)

## 2024-03-29 LAB — CHRONIC URTICARIA PD-BAT: Pooled Donor- BAT CU: 5.19 % (ref 0.00–10.60)

## 2024-04-07 MED ORDER — MONTELUKAST SODIUM 10 MG PO TABS: 10.0000 mg | ORAL_TABLET | Freq: Every day | ORAL | 1 refills | Status: DC

## 2024-04-07 MED ORDER — FAMOTIDINE 20 MG PO TABS: 20.0000 mg | ORAL_TABLET | Freq: Two times a day (BID) | ORAL | 1 refills | Status: DC

## 2024-04-10 ENCOUNTER — Other Ambulatory Visit: Payer: Self-pay | Admitting: Physician Assistant

## 2024-04-10 ENCOUNTER — Telehealth: Payer: Self-pay | Admitting: Allergy & Immunology

## 2024-04-10 DIAGNOSIS — R112 Nausea with vomiting, unspecified: Secondary | ICD-10-CM

## 2024-04-10 NOTE — Telephone Encounter (Signed)
 Patient called and stated she picked up some medication sent in for her per the My Chart conversations had. Generic Pepcid  Famotidine , and generic Singulair  Montelukast . Patient stated that she has an allergy  to iron and that both of these medications have iron in them. She did pick them up and paid for them, but cannot take them. She wanted to make you aware.

## 2024-04-11 NOTE — Telephone Encounter (Signed)
 She sent me a MyChart message. Unsure about this iron allergy . She has iron in her blood, so I'm not clear on how that computes.   Marty Shaggy, MD [facility] 

## 2024-04-17 ENCOUNTER — Telehealth: Payer: Self-pay | Admitting: Physician Assistant

## 2024-04-17 NOTE — Telephone Encounter (Signed)
 I can work her in, but please do it a few weeks from now.  Not first thing in the morning and not 2 PM.  40 min new patient appointment.

## 2024-04-17 NOTE — Telephone Encounter (Signed)
 Spoke to pt, sch toc on 9/3 @ 3:20pm.

## 2024-04-17 NOTE — Telephone Encounter (Signed)
 Pt sch ov, via mychart, with Dr. Randeen on 04/20/24. Appt notes mentioned pt needing a new pcp, since Manuelita Flatness was leaving [facility]. Dr. Randeen is booked out until 10/02/24 for toc/new pt visits. Appt was cancelled due to wrong slot being sch. Gave pt the info on our other providers with sooner slots. Pt stated how she needed a MD provider, who can manage her chronic pain/meds. Suggested Dr. Watt to pt, next toc visit slot is 11/12. Pt stated how she would need refills on meds before Nov. & wanted to be est with a provider before Manuelita left the practice. Let pt know her meds would still be refilled by the office. Pt stated how she didn't want to encounter the difficult refill process with her meds, similar to when [facility]. Pt asked if accepted by Dr. Watt, could she possibly be squeezed into a sooner slot? Call back # [phone]

## 2024-04-20 ENCOUNTER — Ambulatory Visit: Admitting: Family Medicine

## 2024-05-02 ENCOUNTER — Telehealth: Payer: Self-pay | Admitting: Allergy & Immunology

## 2024-05-02 NOTE — Telephone Encounter (Signed)
 Michele Estrada has been scheduled at [facility]  for 05/04/24 at 1:00 pm with Dr. Layvonne.

## 2024-05-03 NOTE — Telephone Encounter (Signed)
 Pt called and said she spoke with [facility]  and they told her they do not see patients with mastocytosis. Dr Iva please advise.

## 2024-05-03 NOTE — Telephone Encounter (Signed)
 What about [facility]?

## 2024-05-08 ENCOUNTER — Encounter: Payer: Self-pay | Admitting: Family Medicine
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
## 2024-05-10 ENCOUNTER — Encounter: Payer: Self-pay | Admitting: Family Medicine

## 2024-05-10 ENCOUNTER — Ambulatory Visit (INDEPENDENT_AMBULATORY_CARE_PROVIDER_SITE_OTHER): Admitting: Family Medicine

## 2024-05-10 VITALS — BP 100/72 | HR 85 | Temp 98.2°F | Ht 60.0 in | Wt 133.1 lb

## 2024-05-10 DIAGNOSIS — Z862 Personal history of diseases of the blood and blood-forming organs and certain disorders involving the immune mechanism: Secondary | ICD-10-CM

## 2024-05-10 DIAGNOSIS — R569 Unspecified convulsions: Secondary | ICD-10-CM

## 2024-05-10 DIAGNOSIS — D8940 Mast cell activation, unspecified: Secondary | ICD-10-CM

## 2024-05-10 DIAGNOSIS — Z86718 Personal history of other venous thrombosis and embolism: Secondary | ICD-10-CM | POA: Diagnosis not present

## 2024-05-10 DIAGNOSIS — F444 Conversion disorder with motor symptom or deficit: Secondary | ICD-10-CM

## 2024-05-10 DIAGNOSIS — G9050 Complex regional pain syndrome I, unspecified: Secondary | ICD-10-CM

## 2024-05-10 DIAGNOSIS — F332 Major depressive disorder, recurrent severe without psychotic features: Secondary | ICD-10-CM

## 2024-05-10 DIAGNOSIS — G43109 Migraine with aura, not intractable, without status migrainosus: Secondary | ICD-10-CM

## 2024-05-10 DIAGNOSIS — R112 Nausea with vomiting, unspecified: Secondary | ICD-10-CM

## 2024-05-10 MED ORDER — ONDANSETRON 4 MG PO TBDP: 4.0000 mg | ORAL_TABLET | Freq: Three times a day (TID) | ORAL | 1 refills | Status: AC | PRN

## 2024-05-10 MED ORDER — TOPIRAMATE 200 MG PO TABS: 200.0000 mg | ORAL_TABLET | Freq: Two times a day (BID) | ORAL | 3 refills | Status: AC

## 2024-05-10 NOTE — Telephone Encounter (Signed)
 We might just be out of luck with this.  Michele Shaggy, MD [facility] 

## 2024-05-10 NOTE — Telephone Encounter (Signed)
 Per patient:  I have called [facility] in the past.  There is a specific doctor I just don't know her name, but she isn't seeing anyone right now.     Forwarding updated message to provider.

## 2024-05-11 ENCOUNTER — Encounter: Payer: Self-pay | Admitting: Family Medicine

## 2024-05-11 DIAGNOSIS — F444 Conversion disorder with motor symptom or deficit: Secondary | ICD-10-CM | POA: Insufficient documentation

## 2024-05-16 ENCOUNTER — Telehealth: Payer: Self-pay

## 2024-05-16 NOTE — Telephone Encounter (Signed)
 Sent to [facility] to refer to them.

## 2024-05-16 NOTE — Telephone Encounter (Signed)
 We could try it, but she does not technically have mastocytosis. I bet they will deny her when they look at her records. She had one abnormal lab consistent with mast cell activation syndrome. But definitely not mastocytosis.   Marty Shaggy, MD [facility] 

## 2024-05-16 NOTE — Telephone Encounter (Signed)
 Please refer to [facility] per Dr. Iva.  P: [phone] F: [phone]

## 2024-06-02 ENCOUNTER — Encounter: Payer: Self-pay | Admitting: Allergy & Immunology

## 2024-06-02 ENCOUNTER — Other Ambulatory Visit: Payer: Self-pay

## 2024-06-02 ENCOUNTER — Emergency Department
Admission: EM | Admit: 2024-06-02 | Discharge: 2024-06-02 | Disposition: A | Discharge status: Home / Self Care | Attending: Emergency Medicine | Admitting: Emergency Medicine

## 2024-06-02 DIAGNOSIS — D8940 Mast cell activation, unspecified: Secondary | ICD-10-CM | POA: Diagnosis not present

## 2024-06-02 DIAGNOSIS — L299 Pruritus, unspecified: Secondary | ICD-10-CM | POA: Diagnosis present

## 2024-06-02 MED ORDER — HYDROXYZINE HCL 25 MG PO TABS
25.0000 mg | ORAL_TABLET | ORAL | Status: DC
  Filled 2024-06-02 (×2): qty 1

## 2024-06-02 MED ORDER — HYDROXYZINE HCL 25 MG PO TABS: 25.0000 mg | ORAL_TABLET | Freq: Three times a day (TID) | ORAL | 0 refills | Status: AC | PRN

## 2024-06-02 MED ORDER — EPINEPHRINE 0.3 MG/0.3ML IJ SOAJ: 0.3000 mg | Freq: Once | INTRAMUSCULAR | 0 refills | Status: AC

## 2024-06-02 NOTE — ED Triage Notes (Signed)
 Pt BIB [facility] from home. Pt has a shellfish allergy  and was peeling shrimp when she developed SOB. Family amdinistered 2 Epi pens IM. Pt has mast cell activation syndrome. Denies SOB upon assessment but c/o itchiness. Red splotches noted on her neck and scalp. AXO NAD  125/77 100 HR 100 RA

## 2024-06-02 NOTE — ED Provider Notes (Signed)
 [facility] Provider Note    Event Date/Time   First MD Initiated Contact with Patient 06/02/24 1802     (approximate)   History   Allergic Reaction   HPI  Michele Estrada is a 53 y.o. female   CRPS, functional neurologic symptom, and mast cell activation syndrome.   She was at home today preparing a meal when she started feeling like she was flushing her face itching across her upper chest and then short of breath and wheezy.  She gave herself 2 epinephrine  pens within about 2 minutes and called EMS.  She started to feel better now.  All shortness of breath has resolved she just feels a little bit itchy.  She relates she has been working with her allergist because she is actually allergic to many of the antihistamines because they contain corn type products.  She does report that her allergist has been rather perplexed by her sensitivities but she cannot be treated with Claritin or Zyrtec .  She is following closely with allergist.  She used both of her EpiPen 's this evening roughly at the same time, reports she used the first 1 but felt like she actually missed and used a second 1 instead with success  Physical Exam   Triage Vital Signs: ED Triage Vitals [06/02/24 1801]  Encounter Vitals Group     BP 106/65     Girls Systolic BP Percentile      Girls Diastolic BP Percentile      Boys Systolic BP Percentile      Boys Diastolic BP Percentile      Pulse Rate 87     Resp 19     Temp 98 F (36.7 C)     Temp Source Oral     SpO2 97 %     Weight 133 lb 2.5 oz (60.4 kg)     Height 5' (1.524 m)     Head Circumference      Peak Flow      Pain Score 0     Pain Loc      Pain Education      Exclude from Growth Chart     Most recent vital signs: Vitals:   06/02/24 1801  BP: 106/65  Pulse: 87  Resp: 19  Temp: 98 F (36.7 C)  SpO2: 97%     General: Awake, no distress.  Slight itching of her scalp and around her shoulders.  No rash no lesions no  swelling airway is widely patent speaking in full clear sentences no distress.  Very pleasant.  No nausea or vomiting CV:  Good peripheral perfusion.  Normal tones and rate Resp:  Normal effort.  Clear bilateral Abd:  No distention.  Soft nontender Other:  Very pleasant in no distress.  Patient reports almost all of her symptoms are much better now Perhaps some very mild erythema of the skin around the cheeks and upper shoulders no frank urticaria  ED Results / Procedures / Treatments   Labs (all labs ordered are listed, but only abnormal results are displayed) Labs Reviewed - No data to display   PROCEDURES:  Critical Care performed: No  Procedures   MEDICATIONS ORDERED IN ED: Medications  hydrOXYzine  (ATARAX ) tablet 25 mg (has no administration in time range)     IMPRESSION / MDM / ASSESSMENT AND PLAN / ED COURSE  I reviewed the triage vital signs and the nursing notes.  Differential diagnosis includes, but is not limited to, with an established history of mast cell activation syndrome.  No obvious precipitating factors.  She reports negative testing for alpha gal.  No bites or injuries.  No obvious allergen exposure but suddenly just darted feeling itchy warm in the face and with difficulty breathing and is now completely resolved except for slight itching across the shoulders and forehead.  She is well-appearing without any evidence of ongoing anaphylactic  Patient's presentation is most consistent with acute complicated illness requiring evaluation and close observation      Clinical Course as of 06/03/24 0058  Fri Jun 02, 2024  1858 I had extensive conversation with the patient about her allergies.  She reports she cannot take Zyrtec  or Claritin as they induce itching.  She has taken Benadryl  in the past.  She is not certain about use of steroids, prednisone  does denote a corn allergy . [MQ]    Clinical Course User Index [MQ] Dicky Anes, MD     Patient reports multiple allergies including be allergic to components of Claritin and Zyrtec .  She reports significant corn allergy  as well.  Review of charts indicates that the patient is also reporting an iron allergy , but her allergist notes this seems that she has iron in her blood.  It is a bit unclear to me if the patient has as extensive a history of allergies as listed.   Consulted with our pharmacist.  Reviewed and advises we have a formulation of Atarax  that does not have any corn.  Discussed with patient she is amenable to this.  Patient given Atarax  without adverse effect.  Patient does report that she may have allergy  to steroids as well.  Her symptoms did resolved, and at this juncture we will discharge with Atarax  which she will use for antihistamine.  She was advised to request her pharmacy review and make certain no associated corn derivation or product which she is agreeable with.  Also refilled her epinephrine  pens.  Careful return precautions discussed with the patient she is very agreeable awake alert well-oriented after few hours of observation no evidence of return of symptomatology.  Appropriate for discharge  Return precautions and treatment recommendations and follow-up discussed with the patient who is agreeable with the plan.   FINAL CLINICAL IMPRESSION(S) / ED DIAGNOSES   Final diagnoses:  Mast cell activation syndrome     Rx / DC Orders   ED Discharge Orders          Ordered    hydrOXYzine  (ATARAX ) 25 MG tablet  3 times daily PRN        06/02/24 2207    EPINEPHrine  0.3 mg/0.3 mL IJ SOAJ injection   Once        06/02/24 2207             Note:  This document was prepared using Dragon voice recognition software and may include unintentional dictation errors.   Dicky Anes, MD 06/03/24 [phone]

## 2024-06-02 NOTE — Discharge Instructions (Addendum)
 Please follow-up closely with Dr. Iva call his office Monday.  Return to the ER right away if you have return of symptoms or other concerns arise such as shortness of breath weakness progressive rash lip or tongue swelling difficulty breathing or other concerns arise.

## 2024-06-03 ENCOUNTER — Other Ambulatory Visit (HOSPITAL_COMMUNITY): Payer: Self-pay

## 2024-06-05 NOTE — Telephone Encounter (Signed)
 [facility] called back and stated they were denying Michele Estrada's referral.  They stated they only see patients with Systemic Mastocytosis  They do not see anything else.  I asked if she had any suggestions and she stated the clinic we all know about in [place] .  Where would you like to go from here?

## 2024-06-14 MED ORDER — EPINEPHRINE 0.3 MG/0.3ML IJ SOAJ: 0.3000 mg | INTRAMUSCULAR | 0 refills | Status: AC | PRN

## 2024-06-14 NOTE — Addendum Note (Signed)
 Addended by: IVA MARTY SALTNESS on: 06/14/2024 05:05 PM   Modules accepted: Orders

## 2024-06-15 ENCOUNTER — Ambulatory Visit (INDEPENDENT_AMBULATORY_CARE_PROVIDER_SITE_OTHER): Admitting: Family Medicine

## 2024-06-15 ENCOUNTER — Other Ambulatory Visit: Payer: Self-pay

## 2024-06-15 ENCOUNTER — Encounter: Payer: Self-pay | Admitting: Family Medicine

## 2024-06-15 VITALS — BP 110/88 | HR 79 | Temp 98.2°F | Ht 60.0 in | Wt 134.6 lb

## 2024-06-15 DIAGNOSIS — L508 Other urticaria: Secondary | ICD-10-CM | POA: Diagnosis not present

## 2024-06-15 DIAGNOSIS — D8940 Mast cell activation, unspecified: Secondary | ICD-10-CM | POA: Diagnosis not present

## 2024-06-15 DIAGNOSIS — R140 Abdominal distension (gaseous): Secondary | ICD-10-CM | POA: Insufficient documentation

## 2024-06-15 DIAGNOSIS — K529 Noninfective gastroenteritis and colitis, unspecified: Secondary | ICD-10-CM

## 2024-06-15 DIAGNOSIS — J3489 Other specified disorders of nose and nasal sinuses: Secondary | ICD-10-CM | POA: Insufficient documentation

## 2024-06-15 MED ORDER — HYDROXYZINE HCL 10 MG PO TABS: 10.0000 mg | ORAL_TABLET | Freq: Two times a day (BID) | ORAL | 5 refills | Status: DC | PRN

## 2024-06-15 MED ORDER — NEFFY 2 MG/0.1ML NA SOLN: 1.0000 | Freq: Once | NASAL | 2 refills | Status: AC | PRN

## 2024-06-15 NOTE — Progress Notes (Signed)
 [address] [place] [address] Dept: [phone]  FOLLOW UP NOTE  Patient ID: Michele Estrada, female    DOB: February 22, 1971  Age: 53 y.o. MRN: [id number] Date of Office Visit: 06/15/2024  Assessment  Chief Complaint: Follow-up (States she still can not eat.) and Allergic Reaction (Shrimp )  HPI Michele Estrada is a 53 year old female who presents to the clinic for follow-up visit.  She was last seen in this clinic on 03/14/2024 by Dr. Iva for evaluation of rhinitis, hives, diarrhea, bloating, food allergy , and chronic regional pain syndrome.  Discussed the use of AI scribe software for clinical note transcription with the patient, who gave verbal consent to proceed.  History of Present Illness Michele Estrada is a 53 year old female with CRPS and multiple allergies who presents with persistent itching and dietary restrictions.  She experiences persistent itching, which is temporarily relieved by hydroxyzine  25 mg tablets. She takes it twice daily instead of the prescribed three times due to dryness and sedation, and it helps reduce itching when taken with her regular medications. Zyrtec  is not an option due to it causing hives and itching, particularly on her neck and face.  She continues to experience constant allergy  symptoms include a runny nose and itchy eyes. The patient reports that she experienced less itching while taking hydroxyzine  for four days.  Her dietary restrictions are significant due to her allergies. Safe foods include tilapia, jasmine rice, watermelon, and sunflower butter, but she is currently unable to tolerate even these, leading to vomiting. She cannot consume dairy, pea protein, or vitamins, and is allergic to iron. Attempts to use protein shakes and supplements have been unsuccessful due to adverse reactions.  Food allergy  testing on 02/02/2024 was positive to soy, egg, shellfish, green he, been, and corn.  She expresses an interest in testing the remaining  foods available for skin testing.  She experiences bloating and constipation, with fiber exacerbating these symptoms. Significant bloating and difficulty with bowel movements are reported, sometimes going a week with only one movement. Despite these issues, she has been losing weight and is concerned about not getting enough nutrients.  We discussed referral to dietitian to help with calorie intake.  Her social life is impacted by dietary restrictions, as she cannot participate in family functions or social gatherings involving food. She feels isolated and unable to enjoy meals with her family and friends. She reports reacting to foods even when she is not consuming them, such as when cooking for her mother or husband.  She is interested [employer] applications nasal epinephrine  as intramuscular epinephrine  injection that has caused a flare of her chronic regional pain syndrome.  Her current medications are listed in the chart.   Drug Allergies:  Allergies  Allergen Reactions   Prozac [Fluoxetine Hcl] Other (See Comments)    High dose caused seizures.   Estrogens Other (See Comments)    DVT on BCPs   Gabapentin  Other (See Comments)    May have made moody and maybe increased migraine frequency. May have made moody and maybe increased migraine frequency May have made moody and maybe increased migraine frequency. May have made moody and maybe increased migraine frequency.   Iron Hives   Corn-Containing Products    Egg Protein (Egg White)    Shellfish Allergy  Other (See Comments)   Soy Allergy  (Obsolete)    Pregabalin Anxiety and Other (See Comments)    Migraines Migraines Migraines Migraines  Migraines  Migraines Migraines Migraines Migraines  Physical Exam: BP 110/88   Pulse 79   Temp 98.2 F (36.8 C)   Ht 5' (1.524 m)   Wt 134 lb 9.6 oz (61.1 kg)   SpO2 99%   BMI 26.29 kg/m    Physical Exam Vitals reviewed.  Constitutional:      Appearance: Normal appearance.  HENT:     Head:  Normocephalic and atraumatic.     Right Ear: Tympanic membrane normal.     Left Ear: Tympanic membrane normal.     Nose:     Comments: Bilateral nares slightly erythematous with thin clear nasal drainage noted.  Pharynx slightly erythematous with no exudate.  Ears normal.  Eyes normal. Eyes:     Conjunctiva/sclera: Conjunctivae normal.  Cardiovascular:     Rate and Rhythm: Normal rate and regular rhythm.     Heart sounds: Normal heart sounds. No murmur heard. Pulmonary:     Effort: Pulmonary effort is normal.     Breath sounds: Normal breath sounds.     Comments: Lungs clear to auscultation Musculoskeletal:        General: Normal range of motion.     Cervical back: Normal range of motion and neck supple.  Skin:    General: Skin is warm and dry.  Neurological:     Mental Status: She is alert and oriented to person, place, and time.  Psychiatric:        Mood and Affect: Mood normal.        Behavior: Behavior normal.        Thought Content: Thought content normal.        Judgment: Judgment normal.     Assessment and Plan: 1. Mast cell activation   2. Chronic diarrhea   3. Bloating   4. Chronic urticaria   5. Rhinorrhea     Meds ordered this encounter  Medications   hydrOXYzine  (ATARAX ) 10 MG tablet    Sig: Take 1 tablet (10 mg total) by mouth 2 (two) times daily as needed.    Dispense:  60 tablet    Refill:  5    Patient Instructions  1. Chronic diarrhea and bloating - with elevated N-Methylhistamine - We are going to continue to work on getting these symptoms under control. - Continue hydroxyzine  25 mg twice a day to control reactions - I would continue to ask the pharmacist whether the medication contains corn.  - Continue to avoid the foods that cause bloating. - Continue to follow up with Dr. Court, GI specialist, as recommended - Refer to nutritionist+99 - We will put in a referral for a second opinion at [facility] or [facility] . - Will send Neffy  - Return to the  clinic for food allergy  testing. Remember to stop antihistamines for 3 days before your food allergy  testing.   2. Non-allergic rhinitis  - Testing was negative in the blood. - Continue with hydroxyzine  25 mg twice a day  Call the clinic if this treatment plan is not working well for you  Follow up in 2 months or sooner if needed.  Please inform us  of any Emergency Department visits, hospitalizations, or changes in symptoms. Call us  before going to the ED for breathing or allergy  symptoms since we might be able to fit you in for a sick visit. Feel free to contact us  anytime with any questions, problems, or concerns.  It was a pleasure to meet you today!   Return in about 2 months (around 08/15/2024), or if symptoms worsen or fail  to improve.    Thank you for the opportunity to care for this patient.  Please do not hesitate to contact me with questions.  Arlean Mutter, FNP [facility] 

## 2024-06-15 NOTE — Patient Instructions (Addendum)
 1. Chronic diarrhea and bloating - with elevated N-Methylhistamine - We are going to continue to work on getting these symptoms under control. - Continue hydroxyzine  25 mg twice a day to control reactions - I would continue to ask the pharmacist whether the medication contains corn.  - Continue to avoid the foods that cause bloating. - Continue to follow up with Dr. Court, GI specialist, as recommended - Refer to nutritionist - We will check on the referral for a second opinion at [facility] . - Will send Neffy  - Return to the clinic for food allergy  testing. Remember to stop antihistamines for 3 days before your food allergy  testing.   2. Non-allergic rhinitis  - Testing was negative in the blood. - Continue with hydroxyzine  25 mg twice a day  Call the clinic if this treatment plan is not working well for you  Follow up in 2 months or sooner if needed.  Please inform us  of any Emergency Department visits, hospitalizations, or changes in symptoms. Call us  before going to the ED for breathing or allergy  symptoms since we might be able to fit you in for a sick visit. Feel free to contact us  anytime with any questions, problems, or concerns.  It was a pleasure to meet you today!

## 2024-06-19 ENCOUNTER — Telehealth: Payer: Self-pay | Admitting: Family Medicine

## 2024-06-19 NOTE — Telephone Encounter (Signed)
 Michele Estrada has been internally referred to [facility] in [place].  They will reach out to her to schedule.  I will follow up in a week.

## 2024-06-22 ENCOUNTER — Ambulatory Visit (INDEPENDENT_AMBULATORY_CARE_PROVIDER_SITE_OTHER): Admitting: Family Medicine

## 2024-06-22 ENCOUNTER — Encounter: Payer: Self-pay | Admitting: Family Medicine

## 2024-06-22 DIAGNOSIS — J310 Chronic rhinitis: Secondary | ICD-10-CM

## 2024-06-22 DIAGNOSIS — T7800XD Anaphylactic reaction due to unspecified food, subsequent encounter: Secondary | ICD-10-CM | POA: Diagnosis not present

## 2024-06-22 DIAGNOSIS — T7800XA Anaphylactic reaction due to unspecified food, initial encounter: Secondary | ICD-10-CM | POA: Insufficient documentation

## 2024-06-22 DIAGNOSIS — K529 Noninfective gastroenteritis and colitis, unspecified: Secondary | ICD-10-CM

## 2024-06-22 MED ORDER — HYDROXYZINE HCL 25 MG PO TABS: ORAL_TABLET | ORAL | 5 refills | Status: AC

## 2024-06-22 NOTE — Addendum Note (Signed)
 Addended by: AZALEA, Chasya Zenz on: 06/22/2024 01:27 PM   Modules accepted: Orders

## 2024-06-22 NOTE — Patient Instructions (Addendum)
 1. Chronic diarrhea and bloating - with elevated N-Methylhistamine - Continue hydroxyzine  25 mg twice a day to control reactions - I would continue to ask the pharmacist whether the medication contains corn.  - Continue to avoid the foods that cause bloating. - Continue to follow up with Dr. Court, GI specialist, as recommended - We will check on the referral for a second opinion at [facility] .  2. Non-allergic rhinitis  - Continue with hydroxyzine  25 mg twice a day if needed for itch or runny nose  3. Food allergy  - Skin testing at today's visit was positive to shrimp and equivocal to grape, squash, banana, and apple. - Previous skin testing was positive to soy, egg, shellfish, green pea, navy bean, corn. Continue to avoid these foods.  In case of an allergic reaction, take hydroxyzine  once every 12-24 hours, and if life-threatening symptoms occur, inject with EpiPen  0.3 mg.   Call the clinic if this treatment plan is not working well for you  Follow up in 2 months or sooner if needed.  Please inform us  of any Emergency Department visits, hospitalizations, or changes in symptoms. Call us  before going to the ED for breathing or allergy  symptoms since we might be able to fit you in for a sick visit. Feel free to contact us  anytime with any questions, problems, or concerns.  It was a pleasure to meet you today!

## 2024-06-22 NOTE — Progress Notes (Signed)
 [address] [place] [address] Dept: [phone]  FOLLOW UP NOTE  Patient ID: Michele Estrada, female    DOB: 1970-10-26  Age: 53 y.o. MRN: [id number] Date of Office Visit: 06/22/2024  Assessment  Chief Complaint: Allergy  Testing  HPI [name] is a 53 year old female who presents to the clinic for skin testing.  She was last seen in this clinic on 06/15/2024 by Arlean Mutter, FNP.  At today's visit, she reports that she is feeling well overall with no cardiopulmonary, gastrointestinal, or integumentary symptoms, other than itching.  She has not had any hydroxyzine  over the last 3 days.  Her current medications are listed in the chart.  Drug Allergies:  Allergies  Allergen Reactions   Prozac [Fluoxetine Hcl] Other (See Comments)    High dose caused seizures.   Estrogens Other (See Comments)    DVT on BCPs   Gabapentin  Other (See Comments)    May have made moody and maybe increased migraine frequency. May have made moody and maybe increased migraine frequency May have made moody and maybe increased migraine frequency. May have made moody and maybe increased migraine frequency.   Iron Hives   Corn-Containing Products    Egg Protein (Egg White)    Shellfish Allergy  Other (See Comments)   Soy Allergy  (Obsolete)    Pregabalin Anxiety and Other (See Comments)    Migraines Migraines Migraines Migraines  Migraines  Migraines Migraines Migraines Migraines     Diagnostics: Percutaneous selected food allergy  testing was positive to shrimp and equivocal to grape, squash, banana, and apple with adequate controls.  Assessment and Plan: 1. Allergy  with anaphylaxis due to food   2. Chronic diarrhea   3. Nonallergic rhinitis       Patient Instructions  1. Chronic diarrhea and bloating - with elevated N-Methylhistamine - Continue hydroxyzine  25 mg twice a day to control reactions - I would continue to ask the pharmacist whether the medication contains corn.   - Continue to avoid the foods that cause bloating. - Continue to follow up with Dr. Court, GI specialist, as recommended - We will check on the referral for a second opinion at [facility] .  2. Non-allergic rhinitis  - Continue with hydroxyzine  25 mg twice a day if needed for itch or runny nose  3. Food allergy  - Skin testing at today's visit was positive to shrimp and equivocal to grape, squash, banana, and apple. - Previous skin testing was positive to soy, egg, shellfish, green pea, navy bean, corn. Continue to avoid these foods.  In case of an allergic reaction, take hydroxyzine  once every 12-24 hours, and if life-threatening symptoms occur, inject with EpiPen  0.3 mg.   Call the clinic if this treatment plan is not working well for you  Follow up in 2 months or sooner if needed.  Please inform us  of any Emergency Department visits, hospitalizations, or changes in symptoms. Call us  before going to the ED for breathing or allergy  symptoms since we might be able to fit you in for a sick visit. Feel free to contact us  anytime with any questions, problems, or concerns.  It was a pleasure to meet you today!          Return in about 2 months (around 08/22/2024), or if symptoms worsen or fail to improve.    Thank you for the opportunity to care for this patient.  Please do not hesitate to contact me with questions.  Arlean Mutter, FNP [facility] 

## 2024-06-26 NOTE — Telephone Encounter (Signed)
 Thank you :)

## 2024-06-26 NOTE — Telephone Encounter (Signed)
 Jordane declined the referral to the dietician again.  I spoke with Katie Pennix, the PAA that worked the referral.  Even though Imunique requested the referral, she still declined it.

## 2024-07-14 ENCOUNTER — Ambulatory Visit: Payer: Self-pay | Admitting: Urology

## 2024-07-14 ENCOUNTER — Ambulatory Visit
Admission: RE | Admit: 2024-07-14 | Discharge: 2024-07-14 | Disposition: A | Discharge status: Home / Self Care | Source: Ambulatory Visit | Attending: Urology | Admitting: Urology

## 2024-07-14 ENCOUNTER — Encounter: Payer: Self-pay | Admitting: Urology

## 2024-07-14 VITALS — BP 134/82 | HR 71 | Ht 60.0 in | Wt 136.0 lb

## 2024-07-14 DIAGNOSIS — Z87442 Personal history of urinary calculi: Secondary | ICD-10-CM
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
## 2024-08-24 ENCOUNTER — Encounter: Payer: Self-pay | Admitting: Allergy & Immunology

## 2024-08-24 ENCOUNTER — Other Ambulatory Visit: Payer: Self-pay

## 2024-08-24 ENCOUNTER — Ambulatory Visit: Admitting: Allergy & Immunology

## 2024-08-24 VITALS — BP 120/70 | HR 72 | Temp 98.3°F

## 2024-08-24 DIAGNOSIS — R140 Abdominal distension (gaseous): Secondary | ICD-10-CM | POA: Diagnosis not present

## 2024-08-24 DIAGNOSIS — K529 Noninfective gastroenteritis and colitis, unspecified: Secondary | ICD-10-CM

## 2024-08-24 DIAGNOSIS — T7800XD Anaphylactic reaction due to unspecified food, subsequent encounter: Secondary | ICD-10-CM | POA: Diagnosis not present

## 2024-08-24 DIAGNOSIS — D8940 Mast cell activation, unspecified: Secondary | ICD-10-CM | POA: Diagnosis not present

## 2024-08-24 DIAGNOSIS — J310 Chronic rhinitis: Secondary | ICD-10-CM | POA: Diagnosis not present

## 2024-08-24 DIAGNOSIS — T7800XA Anaphylactic reaction due to unspecified food, initial encounter: Secondary | ICD-10-CM
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
## 2024-08-24 NOTE — Patient Instructions (Addendum)
 1. Chronic diarrhea and bloating - with elevated N-Methylhistamine - Continue hydroxyzine  25 mg twice a day to control reactions (can increase to 3 times per day). - Continue to avoid the foods that cause bloating. - Continue to follow up with Dr. Court, GI specialist, as recommended.   2. Non-allergic rhinitis  - Continue with hydroxyzine  25 mg 2-3 times per day for itchy or runny nose  3. Food allergy  - in the setting of flushing/hives  - Continue to avoid all of your triggering foods (shrimp, grape, squash, banana, apple, soy, egg, shellfish, green pea, navy bean, corn). - I would like to start a new tablet called Rhapsido that was approved in the last month for treatment of hives.    - Sample of this provided. - Side effect profile is excellent, but the most common symptom being low platelet counts/bleeding. - Note any issues with bleeding.   - Neffy  is up to date.  4. Return in about 3 months (around 11/22/2024). You can have the follow up appointment with Dr. Iva or a Nurse Practicioner (our Nurse Practitioners are excellent and always have Physician oversight!).    Please inform us  of any Emergency Department visits, hospitalizations, or changes in symptoms. Call us  before going to the ED for breathing or allergy  symptoms since we might be able to fit you in for a sick visit. Feel free to contact us  anytime with any questions, problems, or concerns.  It was a pleasure to see you again today!  Websites that have reliable patient information: 1. American Academy of Asthma, Allergy , and Immunology: www.aaaai.org 2. Food Allergy  Research and Education (FARE): foodallergy.org 3. Mothers of Asthmatics: http://www.asthmacommunitynetwork.org

## 2024-09-12 NOTE — Progress Notes (Unsigned)
" ° ° ° °  Michele Mcdonell T. Jet Traynham, MD, CAQ Sports Medicine [facility] [address] [place], [address]  Phone: [phone]  FAX: [phone]  Michele Estrada - 54 y.o. female  MRN [id number]  Date of Birth: 02-17-1971  Date: 09/13/2024  PCP: Watt Mirza, MD  Referral: Watt Mirza, MD  No chief complaint on file.  Subjective:   Michele Estrada is a 54 y.o. very pleasant female patient with There is no height or weight on file to calculate BMI. who presents with the following:  Discussed the use of AI scribe software for clinical note transcription with the patient, who gave verbal consent to proceed.  Patient is here to discuss jury duty summons. History of Present Illness     Review of Systems is noted in the HPI, as appropriate  Objective:   There were no vitals taken for this visit.  GEN: No acute distress; alert,appropriate. PULM: Breathing comfortably in no respiratory distress PSYCH: Normally interactive.   Laboratory and Imaging Data:  Assessment and Plan:   No diagnosis found. Assessment & Plan   Medication Management during today's office visit: No orders of the defined types were placed in this encounter.  There are no discontinued medications.  Orders placed today for conditions managed today: No orders of the defined types were placed in this encounter.   Disposition: No follow-ups on file.  [employer] One speech-to-text software was used for transcription in this dictation.  Possible transcriptional errors can occur using Animal nutritionist.   Signed,  Mirza DASEN. Newman Waren, MD   Outpatient Encounter Medications as of 09/13/2024  Medication Sig   EPINEPHrine  (NEFFY ) 2 MG/0.1ML SOLN Place 1 each into the nose once as needed for up to 1 dose.   EPINEPHrine  0.3 mg/0.3 mL IJ SOAJ injection Inject 0.3 mg into the muscle as needed for anaphylaxis.   hydrOXYzine  (ATARAX ) 25 MG tablet Take 1 tablet up to  twice a day if needed for itch   omeprazole (PRILOSEC) 20 MG capsule Take 20 mg by mouth 2 (two) times daily.   ondansetron  (ZOFRAN -ODT) 4 MG disintegrating tablet Take 1 tablet (4 mg total) by mouth every 8 (eight) hours as needed for nausea or vomiting.   topiramate  (TOPAMAX ) 200 MG tablet Take 1 tablet (200 mg total) by mouth 2 (two) times daily.   No facility-administered encounter medications on file as of 09/13/2024.   "

## 2024-09-13 ENCOUNTER — Ambulatory Visit: Admitting: Family Medicine

## 2024-09-13 ENCOUNTER — Encounter: Payer: Self-pay | Admitting: Family Medicine

## 2024-09-13 VITALS — BP 90/70 | HR 84 | Temp 97.1°F | Ht 60.0 in | Wt 136.5 lb

## 2024-09-13 DIAGNOSIS — G9050 Complex regional pain syndrome I, unspecified: Secondary | ICD-10-CM

## 2024-09-13 DIAGNOSIS — D8940 Mast cell activation, unspecified: Secondary | ICD-10-CM | POA: Diagnosis not present

## 2024-09-13 DIAGNOSIS — F332 Major depressive disorder, recurrent severe without psychotic features: Secondary | ICD-10-CM

## 2024-09-25 ENCOUNTER — Encounter: Payer: Self-pay | Admitting: Family Medicine

## 2024-10-10 ENCOUNTER — Emergency Department: Admission: EM | Admit: 2024-10-10 | Discharge: 2024-10-10 | Disposition: A | Discharge status: Home / Self Care

## 2024-10-10 ENCOUNTER — Ambulatory Visit: Payer: Self-pay

## 2024-10-10 ENCOUNTER — Emergency Department

## 2024-10-10 ENCOUNTER — Other Ambulatory Visit: Payer: Self-pay

## 2024-10-10 ENCOUNTER — Ambulatory Visit: Admitting: Family Medicine

## 2024-10-10 DIAGNOSIS — N390 Urinary tract infection, site not specified: Secondary | ICD-10-CM | POA: Insufficient documentation

## 2024-10-10 LAB — COMPREHENSIVE METABOLIC PANEL WITH GFR
ALT: 24 U/L (ref 0–44)
AST: 22 U/L (ref 15–41)
Albumin: 4.6 g/dL (ref 3.5–5.0)
Alkaline Phosphatase: 49 U/L (ref 38–126)
Anion gap: 12 (ref 5–15)
BUN: 13 mg/dL (ref 6–20)
CO2: 21 mmol/L — ABNORMAL LOW (ref 22–32)
Calcium: 9.4 mg/dL (ref 8.9–10.3)
Chloride: 108 mmol/L (ref 98–111)
Creatinine, Ser: 1.06 mg/dL — ABNORMAL HIGH (ref 0.44–1.00)
GFR, Estimated: >60 mL/min
Glucose, Bld: 92 mg/dL (ref 70–99)
Potassium: 3.7 mmol/L (ref 3.5–5.1)
Sodium: 142 mmol/L (ref 135–145)
Total Bilirubin: 0.3 mg/dL (ref 0.0–1.2)
Total Protein: 7.1 g/dL (ref 6.5–8.1)

## 2024-10-10 LAB — URINALYSIS, ROUTINE W REFLEX MICROSCOPIC
Bacteria, UA: NONE SEEN
Bilirubin Urine: NEGATIVE
Glucose, UA: NEGATIVE mg/dL
Ketones, ur: 5 mg/dL — AB
Nitrite: NEGATIVE
Protein, ur: 100 mg/dL — AB
RBC / HPF: >50 RBC/hpf (ref 0–5)
Specific Gravity, Urine: 1.02 (ref 1.005–1.030)
WBC, UA: >50 WBC/hpf (ref 0–5)
pH: 5 (ref 5.0–8.0)

## 2024-10-10 LAB — CBC
HCT: 38.9 % (ref 36.0–46.0)
Hemoglobin: 13.1 g/dL (ref 12.0–15.0)
MCH: 30.2 pg (ref 26.0–34.0)
MCHC: 33.7 g/dL (ref 30.0–36.0)
MCV: 89.6 fL (ref 80.0–100.0)
Platelets: 215 10*3/uL (ref 150–400)
RBC: 4.34 MIL/uL (ref 3.87–5.11)
RDW: 12.4 % (ref 11.5–15.5)
WBC: 9.7 10*3/uL (ref 4.0–10.5)
nRBC: 0 % (ref 0.0–0.2)

## 2024-10-10 LAB — LIPASE, BLOOD: Lipase: 37 U/L (ref 11–51)

## 2024-10-10 MED ORDER — FAMOTIDINE IN NACL 20-0.9 MG/50ML-% IV SOLN
20.0000 mg | Freq: Once | INTRAVENOUS | Status: DC
  Filled 2024-10-10: qty 50

## 2024-10-10 MED ORDER — DIPHENHYDRAMINE HCL 50 MG/ML IJ SOLN
50.0000 mg | Freq: Once | INTRAMUSCULAR | Status: AC
  Administered 2024-10-10: 50 mg via INTRAVENOUS
  Filled 2024-10-10: qty 1

## 2024-10-10 MED ORDER — METHYLPREDNISOLONE SODIUM SUCC 125 MG IJ SOLR
125.0000 mg | Freq: Once | INTRAMUSCULAR | Status: DC
  Filled 2024-10-10: qty 2

## 2024-10-10 MED ORDER — SODIUM CHLORIDE 0.9 % IV BOLUS
1000.0000 mL | Freq: Once | INTRAVENOUS | Status: AC
  Administered 2024-10-10: 1000 mL via INTRAVENOUS

## 2024-10-10 MED ORDER — CEPHALEXIN 500 MG PO CAPS: 500.0000 mg | ORAL_CAPSULE | Freq: Four times a day (QID) | ORAL | 0 refills | Status: DC

## 2024-10-10 MED ORDER — METHYLPREDNISOLONE SODIUM SUCC 125 MG IJ SOLR
125.0000 mg | Freq: Once | INTRAMUSCULAR | Status: AC
  Administered 2024-10-10: 125 mg via INTRAVENOUS
  Filled 2024-10-10: qty 2

## 2024-10-10 MED ORDER — CEPHALEXIN 500 MG PO CAPS: 500.0000 mg | ORAL_CAPSULE | Freq: Four times a day (QID) | ORAL | 0 refills | Status: AC

## 2024-10-10 MED ORDER — DIPHENHYDRAMINE HCL 50 MG/ML IJ SOLN
50.0000 mg | Freq: Once | INTRAMUSCULAR | Status: DC
  Filled 2024-10-10: qty 1

## 2024-10-10 MED ORDER — SODIUM CHLORIDE 0.9 % IV SOLN
2.0000 g | Freq: Once | INTRAVENOUS | Status: AC
  Administered 2024-10-10: 2 g via INTRAVENOUS
  Filled 2024-10-10: qty 20

## 2024-10-10 NOTE — ED Notes (Signed)
 This RN was asked to assess Pt d/t having an allergic reaction to antibiotic.  Redness and hives noted.

## 2024-10-10 NOTE — ED Provider Notes (Cosign Needed)
 "  [facility] Provider Note    Event Date/Time   First MD Initiated Contact with Patient 10/10/24 1243     (approximate)   History   Abdominal Pain   HPI  Jazzmon Traniece Boffa is a 54 y.o. female history of type II CRPS, anxiety, seizures, DVT, mast cell activation presents emergency department with concerns of right lower quadrant pain for 1 week.  Stating radiating into the back.  Few episodes of blood in her urine.  Patient does have a history of kidney stones.  Did have a fever this morning and some nausea.  However no fever here and no vomiting since she has been here in the ED.      Physical Exam   Triage Vital Signs: ED Triage Vitals [10/10/24 1155]  Encounter Vitals Group     BP (!) 115/97     Girls Systolic BP Percentile      Girls Diastolic BP Percentile      Boys Systolic BP Percentile      Boys Diastolic BP Percentile      Pulse Rate 87     Resp 20     Temp 98 F (36.7 C)     Temp Source Oral     SpO2 98 %     Weight      Height      Head Circumference      Peak Flow      Pain Score 10     Pain Loc      Pain Education      Exclude from Growth Chart     Most recent vital signs: Vitals:   10/10/24 1155  BP: (!) 115/97  Pulse: 87  Resp: 20  Temp: 98 F (36.7 C)  SpO2: 98%     General: Awake, no distress.   CV:  Good peripheral perfusion. regular rate and  rhythm Resp:  Normal effort. Lungs cta Abd:  No distention.  Mildly tender right lower quadrant, no CVA tenderness Other:      ED Results / Procedures / Treatments   Labs (all labs ordered are listed, but only abnormal results are displayed) Labs Reviewed  COMPREHENSIVE METABOLIC PANEL WITH GFR - Abnormal; Notable for the following components:      Result Value   CO2 21 (*)    Creatinine, Ser 1.06 (*)    All other components within normal limits  URINALYSIS, ROUTINE W REFLEX MICROSCOPIC - Abnormal; Notable for the following components:   Color, Urine YELLOW (*)     APPearance TURBID (*)    Hgb urine dipstick LARGE (*)    Ketones, ur 5 (*)    Protein, ur 100 (*)    Leukocytes,Ua LARGE (*)    All other components within normal limits  URINE CULTURE  LIPASE, BLOOD  CBC     EKG     RADIOLOGY CT renal stone    PROCEDURES:   Procedures  Critical Care:  no Chief Complaint  Patient presents with   Abdominal Pain      MEDICATIONS ORDERED IN ED: Medications  cefTRIAXone  (ROCEPHIN ) 2 g in sodium chloride  0.9 % 100 mL IVPB (has no administration in time range)  sodium chloride  0.9 % bolus 1,000 mL (has no administration in time range)     IMPRESSION / MDM / ASSESSMENT AND PLAN / ED COURSE  I reviewed the triage vital signs and the nursing notes.  Differential diagnosis includes, but is not limited to, UTI, pyelonephritis, kidney stone, infected kidney stone, flank pain  Patient's presentation is most consistent with acute illness / injury with system symptoms.   Medications given: Rocephin  2 g IV  Patient did tell if she has severe allergy  to any medication has corn products, soy products etc.  UA shows large amount of hemoglobin, large amount of leuks, greater than 50 RBCs and greater than 50 WBCs with white blood cell clumps and mucus present.  CT renal stone study, independent review interpretation by me as being negative for any acute abnormality  I did explain these findings to the patient.  Will treat her for UTI, did discuss with pharmacist who said if we give her Rocephin  and give it with saline instead of dextrose  that should not cause an issue.  I did discuss this with the patient.  She is in agreement with this treatment plan.  Will be able to discharge to home with Keflex .  She is to return emergency department if worsening.  Did send secure message Dr. Twylla that she is established with him to have him follow-up with her urine culture and see her in his office.      FINAL CLINICAL  IMPRESSION(S) / ED DIAGNOSES   Final diagnoses:  Acute UTI     Rx / DC Orders   ED Discharge Orders          Ordered    cephALEXin  (KEFLEX ) 500 MG capsule  4 times daily        10/10/24 1529             Note:  This document was prepared using Dragon voice recognition software and may include unintentional dictation errors.    Gasper Devere ORN, PA-C 10/10/24 1540  "

## 2024-10-10 NOTE — Discharge Instructions (Addendum)
 Your blood work was reassuring today.  Your urine showed that you have a UTI.  This was sent for culture to confirm as well as make sure that the antibiotic you were prescribed will treat the bacteria causing the infection.  I did reach out to the pharmacist to confirm that Keflex , does not have any of the ingredients you are allergic to.  This is a new medication for you so please watch for signs of an allergic reaction.  Use your EpiPen  if needed and proceed to the ER afterwards.  Please follow-up with urology whose information is attached.

## 2024-10-10 NOTE — Telephone Encounter (Signed)
 Patient  reports thinks this could be UTI. Has an appointment. Called urology but they are closed. Was told any changes to call. Peed nothing but blood when went to bathroom. Going to call spouse to take to ER now. Requesting appointment to be canceled this afternoon ( 10/10/2024 2:00 PM Cleatus Arlyss RAMAN, MD [facility]    Patient going to [facility] now FYI back to office with request to cancel appointment today

## 2024-10-10 NOTE — Telephone Encounter (Addendum)
 FYI Only or Action Required?: FYI only for provider: appointment scheduled on 10/10/24.  Patient was last seen in primary care on 09/13/2024 by Watt Mirza, MD.  Called Nurse Triage reporting Hematuria.  Symptoms began a week ago.  Interventions attempted: OTC medications: Uqora.  Symptoms are: gradually worsening.  Triage Disposition: See HCP Within 4 Hours (Or PCP Triage)  Patient/caregiver understands and will follow disposition?: Yes  Reason for Disposition  [1] Pain or burning with passing urine AND [2] side (flank) or back pain present  Answer Assessment - Initial Assessment Questions Pt reports 2 weeks ago experiencing lower abd/pelvic pain, dysuria and hematuria, pt has been taking Uqora and symptoms subsided but have returned. Hx of kidney stone 2 years ago.    1. COLOR of URINE: Describe the color of the urine.  (e.g., tea-colored, pink, red, bloody) Do you have blood clots in your urine? (e.g., none, pea, grape, small coin)     Bright red when wiping, denies seeing any blood in the toilet  2. ONSET: When did the bleeding start?      First time last week, went away and experienced it again today  3. EPISODES: How many times has there been blood in the urine? or How many times today?     Twice this morning  4. PAIN with URINATION: Is there any pain with passing your urine? If Yes, ask: How bad is the pain?  (Scale 1-10; or mild, moderate, severe)     Mild dysuria  5. FEVER: Do you have a fever? If Yes, ask: What is your temperature, how was it measured, and when did it start?     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 ASSOCIATED SYMPTOMS: Are you passing urine more frequently than usual?     Denies  7. OTHER SYMPTOMS: Do you have any other symptoms? (e.g., back/flank pain, abdomen pain, vomiting)     Stabbing pain in lower abd / pelvic area, lower back and up into rib cage. 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 States it went away for a little bit when she took Uqora (has a lot of allergies), but states its now back and not going away.

## 2024-10-10 NOTE — Telephone Encounter (Signed)
 Appointment has been canceled.

## 2024-10-11 LAB — URINE CULTURE: Culture: NO GROWTH

## 2024-10-23 ENCOUNTER — Ambulatory Visit: Admitting: Physician Assistant

## 2024-11-22 ENCOUNTER — Ambulatory Visit: Admitting: Allergy

## 2025-07-13 ENCOUNTER — Ambulatory Visit: Admitting: Urology
